# Patient Record
Sex: Male | Born: 1965 | Race: White | Hispanic: No | Marital: Married | Smoking: Former smoker
Health system: Southern US, Community
[De-identification: ages and names within clinical notes are randomized; demographics above are authoritative.]

## PROBLEM LIST (undated history)

## (undated) DIAGNOSIS — I10 Essential (primary) hypertension: Secondary | ICD-10-CM

## (undated) DIAGNOSIS — R5381 Other malaise: Secondary | ICD-10-CM

## (undated) DIAGNOSIS — E7800 Pure hypercholesterolemia, unspecified: Secondary | ICD-10-CM

## (undated) DIAGNOSIS — G4733 Obstructive sleep apnea (adult) (pediatric): Secondary | ICD-10-CM

## (undated) DIAGNOSIS — B351 Tinea unguium: Secondary | ICD-10-CM

## (undated) DIAGNOSIS — M1990 Unspecified osteoarthritis, unspecified site: Secondary | ICD-10-CM

## (undated) DIAGNOSIS — L719 Rosacea, unspecified: Secondary | ICD-10-CM

## (undated) DIAGNOSIS — G4730 Sleep apnea, unspecified: Secondary | ICD-10-CM

## (undated) DIAGNOSIS — I2510 Atherosclerotic heart disease of native coronary artery without angina pectoris: Secondary | ICD-10-CM

## (undated) DIAGNOSIS — R55 Syncope and collapse: Secondary | ICD-10-CM

## (undated) DIAGNOSIS — N529 Male erectile dysfunction, unspecified: Secondary | ICD-10-CM

## (undated) DIAGNOSIS — J019 Acute sinusitis, unspecified: Secondary | ICD-10-CM

## (undated) DIAGNOSIS — Z0000 Encounter for general adult medical examination without abnormal findings: Secondary | ICD-10-CM

## (undated) DIAGNOSIS — R5383 Other fatigue: Secondary | ICD-10-CM

## (undated) DIAGNOSIS — E291 Testicular hypofunction: Secondary | ICD-10-CM

## (undated) DIAGNOSIS — E785 Hyperlipidemia, unspecified: Secondary | ICD-10-CM

## (undated) DIAGNOSIS — K219 Gastro-esophageal reflux disease without esophagitis: Secondary | ICD-10-CM

## (undated) HISTORY — DX: Other fatigue: R53.83

## (undated) HISTORY — DX: Obstructive sleep apnea (adult) (pediatric): G47.33

## (undated) HISTORY — DX: Pure hypercholesterolemia, unspecified: E78.00

## (undated) HISTORY — DX: Gastro-esophageal reflux disease without esophagitis: K21.9

## (undated) HISTORY — DX: Male erectile dysfunction, unspecified: N52.9

## (undated) HISTORY — DX: Testicular hypofunction: E29.1

## (undated) HISTORY — DX: Tinea unguium: B35.1

## (undated) HISTORY — DX: Other malaise: R53.81

## (undated) HISTORY — DX: Acute sinusitis, unspecified: J01.90

## (undated) HISTORY — DX: Sleep apnea, unspecified: G47.30

## (undated) HISTORY — DX: Essential (primary) hypertension: I10

## (undated) HISTORY — PX: WRIST SURGERY: SHX841

## (undated) HISTORY — PX: CARDIAC CATHETERIZATION: SHX172

## (undated) HISTORY — DX: Hyperlipidemia, unspecified: E78.5

## (undated) HISTORY — DX: Encounter for general adult medical examination without abnormal findings: Z00.00

## (undated) HISTORY — DX: Morbid (severe) obesity due to excess calories: E66.01

## (undated) HISTORY — DX: Rosacea, unspecified: L71.9

---

## 1999-03-30 ENCOUNTER — Emergency Department (HOSPITAL_COMMUNITY): Admission: EM | Admit: 1999-03-30 | Discharge: 1999-03-30 | Payer: Self-pay | Admitting: Emergency Medicine

## 1999-07-21 HISTORY — PX: OTHER SURGICAL HISTORY: SHX169

## 2000-03-13 ENCOUNTER — Encounter: Payer: Self-pay | Admitting: Pulmonary Disease

## 2000-03-13 ENCOUNTER — Ambulatory Visit (HOSPITAL_BASED_OUTPATIENT_CLINIC_OR_DEPARTMENT_OTHER): Admission: RE | Admit: 2000-03-13 | Discharge: 2000-03-13 | Payer: Self-pay | Admitting: Pulmonary Disease

## 2001-07-20 HISTORY — PX: BACK SURGERY: SHX140

## 2001-12-22 ENCOUNTER — Observation Stay (HOSPITAL_COMMUNITY): Admission: RE | Admit: 2001-12-22 | Discharge: 2001-12-23 | Payer: Self-pay | Admitting: Neurosurgery

## 2002-04-13 ENCOUNTER — Ambulatory Visit (HOSPITAL_COMMUNITY): Admission: RE | Admit: 2002-04-13 | Discharge: 2002-04-13 | Payer: Self-pay | Admitting: Neurosurgery

## 2002-12-19 ENCOUNTER — Encounter: Admission: RE | Admit: 2002-12-19 | Discharge: 2002-12-19 | Payer: Self-pay | Admitting: Family Medicine

## 2002-12-19 ENCOUNTER — Encounter: Payer: Self-pay | Admitting: Family Medicine

## 2003-07-21 HISTORY — PX: KNEE SURGERY: SHX244

## 2003-10-26 ENCOUNTER — Emergency Department (HOSPITAL_COMMUNITY): Admission: EM | Admit: 2003-10-26 | Discharge: 2003-10-27 | Payer: Self-pay | Admitting: Emergency Medicine

## 2005-07-10 ENCOUNTER — Ambulatory Visit: Payer: Self-pay | Admitting: Family Medicine

## 2005-07-17 ENCOUNTER — Encounter: Admission: RE | Admit: 2005-07-17 | Discharge: 2005-07-17 | Payer: Self-pay | Admitting: Family Medicine

## 2005-07-20 HISTORY — PX: OTHER SURGICAL HISTORY: SHX169

## 2006-05-20 ENCOUNTER — Ambulatory Visit: Payer: Self-pay | Admitting: Family Medicine

## 2006-08-20 ENCOUNTER — Ambulatory Visit: Payer: Self-pay | Admitting: Family Medicine

## 2006-08-20 LAB — CONVERTED CEMR LAB
Cholesterol: 198 mg/dL (ref 0–200)
HDL: 32.7 mg/dL — ABNORMAL LOW (ref >39.0)
LDL Cholesterol: 149 mg/dL — ABNORMAL HIGH (ref 0–99)
Total CHOL/HDL Ratio: 6.1
Triglycerides: 81 mg/dL (ref 0–149)
VLDL: 16 mg/dL (ref 0–40)

## 2006-08-24 ENCOUNTER — Ambulatory Visit: Payer: Self-pay | Admitting: Family Medicine

## 2006-12-02 ENCOUNTER — Ambulatory Visit: Payer: Self-pay | Admitting: Family Medicine

## 2006-12-02 LAB — CONVERTED CEMR LAB
Cholesterol: 196 mg/dL (ref 0–200)
HDL: 33.9 mg/dL — ABNORMAL LOW (ref >39.0)
LDL Cholesterol: 141 mg/dL — ABNORMAL HIGH (ref 0–99)
Total CHOL/HDL Ratio: 5.8
Triglycerides: 105 mg/dL (ref 0–149)
VLDL: 21 mg/dL (ref 0–40)

## 2007-03-08 ENCOUNTER — Ambulatory Visit: Payer: Self-pay | Admitting: Family Medicine

## 2007-03-09 LAB — CONVERTED CEMR LAB
ALT: 20 units/L (ref 0–53)
AST: 16 units/L (ref 0–37)
LDL Cholesterol: 104 mg/dL — ABNORMAL HIGH (ref 0–99)
Total CHOL/HDL Ratio: 4.3

## 2007-10-03 ENCOUNTER — Ambulatory Visit: Payer: Self-pay | Admitting: Internal Medicine

## 2007-10-03 DIAGNOSIS — E782 Mixed hyperlipidemia: Secondary | ICD-10-CM | POA: Insufficient documentation

## 2007-10-03 DIAGNOSIS — E785 Hyperlipidemia, unspecified: Secondary | ICD-10-CM | POA: Insufficient documentation

## 2007-10-07 DIAGNOSIS — L719 Rosacea, unspecified: Secondary | ICD-10-CM | POA: Insufficient documentation

## 2007-11-14 ENCOUNTER — Ambulatory Visit: Payer: Self-pay | Admitting: Family Medicine

## 2007-11-14 DIAGNOSIS — I10 Essential (primary) hypertension: Secondary | ICD-10-CM | POA: Insufficient documentation

## 2007-11-25 ENCOUNTER — Ambulatory Visit: Payer: Self-pay | Admitting: Pulmonary Disease

## 2007-11-25 DIAGNOSIS — G4733 Obstructive sleep apnea (adult) (pediatric): Secondary | ICD-10-CM

## 2007-12-15 ENCOUNTER — Ambulatory Visit: Payer: Self-pay | Admitting: Family Medicine

## 2007-12-16 ENCOUNTER — Encounter: Payer: Self-pay | Admitting: Pulmonary Disease

## 2007-12-16 ENCOUNTER — Ambulatory Visit (HOSPITAL_BASED_OUTPATIENT_CLINIC_OR_DEPARTMENT_OTHER): Admission: RE | Admit: 2007-12-16 | Discharge: 2007-12-16 | Payer: Self-pay | Admitting: Pulmonary Disease

## 2007-12-30 ENCOUNTER — Encounter: Payer: Self-pay | Admitting: Internal Medicine

## 2007-12-30 ENCOUNTER — Ambulatory Visit: Payer: Self-pay | Admitting: Pulmonary Disease

## 2008-01-02 ENCOUNTER — Encounter: Payer: Self-pay | Admitting: Pulmonary Disease

## 2008-02-03 ENCOUNTER — Ambulatory Visit: Payer: Self-pay | Admitting: Pulmonary Disease

## 2008-03-14 ENCOUNTER — Ambulatory Visit: Payer: Self-pay | Admitting: Family Medicine

## 2008-03-21 ENCOUNTER — Encounter: Payer: Self-pay | Admitting: Family Medicine

## 2008-09-14 ENCOUNTER — Ambulatory Visit: Payer: Self-pay | Admitting: Family Medicine

## 2008-09-14 DIAGNOSIS — B351 Tinea unguium: Secondary | ICD-10-CM

## 2008-09-14 DIAGNOSIS — K219 Gastro-esophageal reflux disease without esophagitis: Secondary | ICD-10-CM

## 2008-09-14 LAB — CONVERTED CEMR LAB
Cholesterol, target level: 200 mg/dL
HDL goal, serum: 40 mg/dL

## 2008-09-19 ENCOUNTER — Ambulatory Visit: Payer: Self-pay | Admitting: Family Medicine

## 2008-09-26 LAB — CONVERTED CEMR LAB
Alkaline Phosphatase: 50 units/L (ref 39–117)
Bilirubin, Direct: 0.1 mg/dL (ref 0.0–0.3)
GFR calc Af Amer: 105 mL/min
GFR calc non Af Amer: 87 mL/min
Glucose, Bld: 112 mg/dL — ABNORMAL HIGH (ref 70–99)
LDL Cholesterol: 112 mg/dL — ABNORMAL HIGH (ref 0–99)
Potassium: 3.6 meq/L (ref 3.5–5.1)
Sodium: 140 meq/L (ref 135–145)
VLDL: 17 mg/dL (ref 0–40)

## 2009-01-03 ENCOUNTER — Ambulatory Visit: Payer: Self-pay | Admitting: Family Medicine

## 2009-06-20 ENCOUNTER — Telehealth: Payer: Self-pay | Admitting: Family Medicine

## 2009-08-21 ENCOUNTER — Ambulatory Visit: Payer: Self-pay | Admitting: Family Medicine

## 2009-08-21 ENCOUNTER — Telehealth: Payer: Self-pay | Admitting: Family Medicine

## 2009-08-26 ENCOUNTER — Telehealth: Payer: Self-pay | Admitting: Family Medicine

## 2009-09-03 ENCOUNTER — Ambulatory Visit: Payer: Self-pay | Admitting: Family Medicine

## 2009-09-03 DIAGNOSIS — N529 Male erectile dysfunction, unspecified: Secondary | ICD-10-CM

## 2009-09-11 ENCOUNTER — Ambulatory Visit: Payer: Self-pay | Admitting: Family Medicine

## 2009-09-11 LAB — CONVERTED CEMR LAB
Basophils Relative: 0.6 % (ref 0.0–3.0)
Bilirubin, Direct: 0.2 mg/dL (ref 0.0–0.3)
Calcium: 9.1 mg/dL (ref 8.4–10.5)
Creatinine, Ser: 1 mg/dL (ref 0.4–1.5)
Eosinophils Absolute: 0.2 10*3/uL (ref 0.0–0.7)
HDL: 37.1 mg/dL — ABNORMAL LOW (ref >39.00)
Hemoglobin: 14.7 g/dL (ref 13.0–17.0)
LDL Cholesterol: 85 mg/dL (ref 0–99)
Lymphocytes Relative: 37.3 % (ref 12.0–46.0)
MCHC: 34 g/dL (ref 30.0–36.0)
MCV: 99.2 fL (ref 78.0–100.0)
Monocytes Absolute: 0.4 10*3/uL (ref 0.1–1.0)
Neutro Abs: 2.3 10*3/uL (ref 1.4–7.7)
RBC: 4.35 M/uL (ref 4.22–5.81)
Total Bilirubin: 0.6 mg/dL (ref 0.3–1.2)
Total CHOL/HDL Ratio: 4
Triglycerides: 99 mg/dL (ref 0.0–149.0)

## 2009-09-17 ENCOUNTER — Ambulatory Visit: Payer: Self-pay | Admitting: Family Medicine

## 2009-09-17 DIAGNOSIS — E349 Endocrine disorder, unspecified: Secondary | ICD-10-CM

## 2010-02-21 ENCOUNTER — Ambulatory Visit: Payer: Self-pay | Admitting: Family Medicine

## 2010-02-21 DIAGNOSIS — R5383 Other fatigue: Secondary | ICD-10-CM

## 2010-02-21 DIAGNOSIS — R5381 Other malaise: Secondary | ICD-10-CM

## 2010-02-22 LAB — CONVERTED CEMR LAB
AST: 23 units/L (ref 0–37)
BUN: 15 mg/dL (ref 6–23)
Calcium: 9.2 mg/dL (ref 8.4–10.5)
Cholesterol: 145 mg/dL (ref 0–200)
Creatinine, Ser: 1.2 mg/dL (ref 0.4–1.5)
GFR calc non Af Amer: 71.99 mL/min (ref >60)
HDL: 30.2 mg/dL — ABNORMAL LOW (ref >39.00)
LDL Cholesterol: 92 mg/dL (ref 0–99)
Potassium: 3.9 meq/L (ref 3.5–5.1)
Testosterone: 211 ng/dL — ABNORMAL LOW (ref 350.00–890.00)
Total Bilirubin: 0.7 mg/dL (ref 0.3–1.2)
VLDL: 23 mg/dL (ref 0.0–40.0)

## 2010-02-28 ENCOUNTER — Ambulatory Visit: Payer: Self-pay | Admitting: Family Medicine

## 2010-08-17 LAB — CONVERTED CEMR LAB
AST: 19 units/L (ref 0–37)
Albumin: 3.7 g/dL (ref 3.5–5.2)
BUN: 10 mg/dL (ref 6–23)
Basophils Relative: 0.2 % (ref 0.0–1.0)
Calcium: 9.1 mg/dL (ref 8.4–10.5)
Chloride: 108 meq/L (ref 96–112)
Cholesterol: 151 mg/dL (ref 0–200)
Creatinine, Ser: 1 mg/dL (ref 0.4–1.5)
Eosinophils Absolute: 0.2 10*3/uL (ref 0.0–0.7)
Eosinophils Relative: 4.4 % (ref 0.0–5.0)
GFR calc non Af Amer: 88 mL/min
HCT: 45.2 % (ref 39.0–52.0)
Hemoglobin: 15.3 g/dL (ref 13.0–17.0)
MCV: 97.1 fL (ref 78.0–100.0)
Monocytes Absolute: 0.4 10*3/uL (ref 0.1–1.0)
Neutro Abs: 2.7 10*3/uL (ref 1.4–7.7)
RBC: 4.66 M/uL (ref 4.22–5.81)
TSH: 2.75 microintl units/mL (ref 0.35–5.50)
WBC: 5.3 10*3/uL (ref 4.5–10.5)

## 2010-08-19 NOTE — Letter (Signed)
Summary: Out of Work  [facility] [employer] [facility]  [address] [place], [place] [address]   Phone: [phone]  Fax: [phone]    August 21, 2009   Employee:  Todd Owens    To Whom It May Concern:   For Medical reasons, please excuse the above named employee from work for the following dates:  Start:  August 21, 2009 12:49 PM   End:  August 21, 2009 12:49 PM   If you need additional information, please feel free to contact our office.         Sincerely,    Kerby Nora, MD

## 2010-08-19 NOTE — Assessment & Plan Note (Signed)
Summary: 6 m f/u dlo   Vital Signs:  Patient profile:   45 year old male Height:      69 inches Weight:      325.0 pounds BMI:     48.17 Temp:     98.0 degrees F oral Pulse rate:   68 / minute Pulse rhythm:   regular BP sitting:   110 / 72  (left arm) Cuff size:   large  Vitals Entered By: Benny Lennert CMA Duncan Dull) (February 28, 2010 11:13 AM)  History of Present Illness: Chief complaint 6 month follow up  Testicular hypofunction...testosterone remains low..will refer to URO for further consideration of eval and treatment possibilities.  Has gained some weight. BP well controlled. Working on lifestyle change. Working on [employer]..but in busy period with life currently.   Lipid Management History:      Positive NCEP/ATP III risk factors include HDL cholesterol less than 40 and hypertension.  Negative NCEP/ATP III risk factors include male age less than 19 years old and non-tobacco-user status.        Adjunctive measures started by the patient include aerobic exercise, limit alcohol consumpton, and weight reduction.  He expresses no side effects from his lipid-lowering medication.  The patient denies any symptoms to suggest myopathy or liver disease.     Problems Prior to Update: 1)  Other Malaise and Fatigue  (ICD-780.79) 2)  Preventive Health Care  (ICD-V70.0) 3)  Testicular Hypofunction  (ICD-257.2) 4)  Erectile Dysfunction, Organic  (ICD-607.84) 5)  Gerd  (ICD-530.81) 6)  Onychomycosis, Bilateral  (ICD-110.1) 7)  Obstructive Sleep Apnea  (ICD-327.23) 8)  Essential Hypertension, Benign  (ICD-401.1) 9)  Acne, Rosacea  (ICD-695.3) 10)  Morbid Obesity  (ICD-278.01) 11)  Hyperlipidemia  (ICD-272.4)  Current Medications (verified): 1)  Simvastatin 40 Mg  Tabs (Simvastatin) .... Take One By Mouth Once A Day 2)  Triamterene-Hctz 37.5-25 Mg Tabs (Triamterene-Hctz) .Marland Kitchen.. 1 Tab By Mouth Daily 3)  Terbinafine Hcl 250 Mg Tabs (Terbinafine Hcl) .... Take 1 Tablet By Mouth Once A Day X  12 Weeks 4)  Ibuprofen 800 Mg Tabs (Ibuprofen) .... One Tab By Mouth in Amwith Food 5)  Omeprazole 40 Mg Cpdr (Omeprazole) .Marland Kitchen.. 1 Tab Po Daily 6)  Levitra 20 Mg Tabs (Vardenafil Hcl) .... 1/4-1/2 Tab As Needed Sexual Activity  Allergies (verified): No Known Drug Allergies  Past History:  Past medical, surgical, family and social histories (including risk factors) reviewed, and no changes noted (except as noted below).  Past Medical History: Reviewed history from 11/25/2007 and no changes required. Current Problems:  ESSENTIAL HYPERTENSION, BENIGN (ICD-401.1) ACNE, ROSACEA (ICD-695.3) MORBID OBESITY (ICD-278.01) SINUSITIS- ACUTE-NOS (ICD-461.9) HYPERLIPIDEMIA (ICD-272.4) HYPERCHOLESTEROLEMIA (ICD-272.0)    Past Surgical History: Reviewed history from 10/07/2007 and no changes required. 2003 back surgery 2005 knee surgery 04/2006 PFT's neg 2001 Cardiolyte neg  Family History: Reviewed history from 11/25/2007 and no changes required. mother HTN father: HTN  heart disease: father, mother, sister  Social History: Reviewed history from 11/25/2007 and no changes required. pt quit smoking. Regular exercise-yes, minimally since christmas pt is married.  Review of Systems General:  Complains of fatigue; denies fever. CV:  Denies chest pain or discomfort. Resp:  Denies shortness of breath. GI:  Denies bloody stools. GU:  Denies dysuria.  Physical Exam  General:  morbidly obese male in NAd Mouth:  Oral mucosa and oropharynx without lesions or exudates.  Teeth in good repair. smal orooharynx Neck:  no carotid bruit or thyromegaly no cervical or supraclavicular lymphadenopathy  Lungs:  Normal respiratory effort, chest expands symmetrically. Lungs are clear to auscultation, no crackles or wheezes. Heart:  Normal rate and regular rhythm. S1 and S2 normal without gallop, murmur, click, rub or other extra sounds. Abdomen:  Bowel sounds positive,abdomen soft and non-tender  without masses, organomegaly or hernias noted. Pulses:  R and L posterior tibial pulses are full and equal bilaterally  Extremities:  no edema Skin:  Intact without suspicious lesions or rashes Psych:  Oriented X3, memory intact for recent and remote, and normally interactive.     Impression & Recommendations:  Problem # 1:  TESTICULAR HYPOFUNCTION (ICD-257.2) Will eval further with prolactin, TSH, FSH and LH.  Not interested in referral to Uro at this time.   Problem # 2:  ERECTILE DYSFUNCTION, ORGANIC (ICD-607.84) Will send refill to [facility] where it may be cheaper.  His updated medication list for this problem includes:    Levitra 20 Mg Tabs (Vardenafil hcl) .Marland Kitchen... 1/4-1/2 tab as needed sexual activity  Problem # 3:  ESSENTIAL HYPERTENSION, BENIGN (ICD-401.1)  Well controlled. Continue current medication.  His updated medication list for this problem includes:    Triamterene-hctz 37.5-25 Mg Tabs (Triamterene-hctz) .Marland Kitchen... 1 tab by mouth daily  BP today: 110/72 Prior BP: 130/76 (09/17/2009)  Prior 10 Yr Risk Heart Disease: 4 % (09/17/2009)  Labs Reviewed: K+: 3.9 (02/21/2010) Creat: : 1.2 (02/21/2010)   Chol: 145 (02/21/2010)   HDL: 30.20 (02/21/2010)   LDL: 92 (02/21/2010)   TG: 115.0 (02/21/2010)  Problem # 4:  HYPERLIPIDEMIA (ICD-272.4) Encouraged exercise, weight loss, healthy eating habits.  His updated medication list for this problem includes:    Simvastatin 40 Mg Tabs (Simvastatin) .Marland Kitchen... Take one by mouth once a day  Labs Reviewed: SGOT: 23 (02/21/2010)   SGPT: 27 (02/21/2010)  Lipid Goals: Chol Goal: 200 (09/14/2008)   HDL Goal: 40 (09/14/2008)   LDL Goal: 130 (09/14/2008)   TG Goal: 150 (09/14/2008)  Prior 10 Yr Risk Heart Disease: 4 % (09/17/2009)   HDL:30.20 (02/21/2010), 37.10 (09/11/2009)  LDL:92 (02/21/2010), 85 (09/11/2009)  Chol:145 (02/21/2010), 142 (09/11/2009)  Trig:115.0 (02/21/2010), 99.0 (09/11/2009)  Complete Medication List: 1)  Simvastatin 40 Mg  Tabs (Simvastatin) .... Take one by mouth once a day 2)  Triamterene-hctz 37.5-25 Mg Tabs (Triamterene-hctz) .Marland Kitchen.. 1 tab by mouth daily 3)  Terbinafine Hcl 250 Mg Tabs (Terbinafine hcl) .... Take 1 tablet by mouth once a day x 12 weeks 4)  Ibuprofen 800 Mg Tabs (Ibuprofen) .... One tab by mouth in amwith food 5)  Omeprazole 40 Mg Cpdr (Omeprazole) .Marland Kitchen.. 1 tab po daily 6)  Levitra 20 Mg Tabs (Vardenafil hcl) .... 1/4-1/2 tab as needed sexual activity  Lipid Assessment/Plan:      Based on NCEP/ATP III, the patient's risk factor category is "2 or more risk factors and a calculated 10 year CAD risk of < 20%".  The patient's lipid goals are as follows: Total cholesterol goal is 200; LDL cholesterol goal is 130; HDL cholesterol goal is 40; Triglyceride goal is 150.  His LDL cholesterol goal has been met.    Patient Instructions: 1)  Schedule CPX in 6 months with fasting labs prior.Marland Kitchen 2)  CMET, lipids, testosterone, TSH, prolactin, FSH , LH 3)  Dx 272.0, 257.2 Prescriptions: LEVITRA 20 MG TABS (VARDENAFIL HCL) 1/4-1/2 tab as needed sexual activity  #10 x 0   Entered and Authorized by:   Kerby Nora MD   Signed by:   Kerby Nora MD on 02/28/2010   Method used:  Electronically to        [facility] (retail)       [address]       [place], [place]  [address]       Ph: [phone]       Fax: [phone]   RxID:   [id number]   Current Allergies (reviewed today): No known allergies

## 2010-08-19 NOTE — Progress Notes (Signed)
Summary: approval  Phone Note From Pharmacy   Caller: [facility] Call For: Dr Ermalene Searing  Details for Reason: FYI Summary of Call: appproval for Omeprazole for 08/02/2009 until 08/23/2010. Paper in box. Initial call taken by: Silas Sacramento CMA,  August 26, 2009 9:29 AM

## 2010-08-19 NOTE — Assessment & Plan Note (Signed)
Summary: flu???/rbh   Vital Signs:  Patient profile:   45 year old male Height:      69 inches Weight:      327.50 pounds BMI:     48.54 Temp:     98.6 degrees F oral Pulse rate:   76 / minute Pulse rhythm:   regular BP sitting:   128 / 80  (left arm) Cuff size:   large  Vitals Entered By: Lewanda Rife LPN (August 21, 2009 12:04 PM)  Acute Visit History:      The patient complains of earache, nasal discharge, and sore throat.  These symptoms began one day ago.  He denies chest pain, cough, fever, headache, and sinus problems.  Other comments include: sweats body ache, fatigue.        Earache symptom: no pain, ear fullness.  There is no history of recent antibiotic usage associated with the earache.        Problems Prior to Update: 1)  Temporomandibular Joint Pain  (ICD-524.62) 2)  Gerd  (ICD-530.81) 3)  Onychomycosis, Bilateral  (ICD-110.1) 4)  Obstructive Sleep Apnea  (ICD-327.23) 5)  Essential Hypertension, Benign  (ICD-401.1) 6)  Acne, Rosacea  (ICD-695.3) 7)  Morbid Obesity  (ICD-278.01) 8)  Sinusitis- Acute-nos  (ICD-461.9) 9)  Hyperlipidemia  (ICD-272.4) 10)  Hypercholesterolemia  (ICD-272.0)  Current Medications (verified): 1)  Simvastatin 40 Mg  Tabs (Simvastatin) .... Take One By Mouth Once A Day 2)  Triamterene-Hctz 37.5-25 Mg Tabs (Triamterene-Hctz) .Marland Kitchen.. 1 Tab By Mouth Daily 3)  Terbinafine Hcl 250 Mg Tabs (Terbinafine Hcl) .... Take 1 Tablet By Mouth Once A Day X 12 Weeks 4)  Ranitidine Hcl 150 Mg Tabs (Ranitidine Hcl) .Marland Kitchen.. 1 Tab By Mouth Two Times A Day 5)  Ibuprofen 800 Mg Tabs (Ibuprofen) .... One Tab By Mouth in Amwith Food  Allergies (verified): No Known Drug Allergies  Past History:  Past medical, surgical, family and social histories (including risk factors) reviewed, and no changes noted (except as noted below).  Past Medical History: Reviewed history from 11/25/2007 and no changes required. Current Problems:  ESSENTIAL HYPERTENSION, BENIGN  (ICD-401.1) ACNE, ROSACEA (ICD-695.3) MORBID OBESITY (ICD-278.01) SINUSITIS- ACUTE-NOS (ICD-461.9) HYPERLIPIDEMIA (ICD-272.4) HYPERCHOLESTEROLEMIA (ICD-272.0)    Past Surgical History: Reviewed history from 10/07/2007 and no changes required. 2003 back surgery 2005 knee surgery 04/2006 PFT's neg 2001 Cardiolyte neg  Family History: Reviewed history from 11/25/2007 and no changes required. mother HTN father: HTN  heart disease: father, mother, sister  Social History: Reviewed history from 11/25/2007 and no changes required. pt quit smoking. Regular exercise-yes, minimally since christmas pt is married.  Review of Systems General:  Complains of fatigue. CV:  Denies chest pain or discomfort. Resp:  Denies shortness of breath. GI:  Denies abdominal pain.  Physical Exam  General:  obese appearing male in NAD Head:  no maxillary sinus ttp.  Ears:  clear fluid B TMs Nose:  External nasal examination shows no deformity or inflammation. Nasal mucosa are pink and moist without lesions or exudates. Mouth:  MMM, no oropharyngel erythema, no exudate Neck:  no carotid bruit or thyromegaly no cervical or supraclavicular lymphadenopathy  Lungs:  Normal respiratory effort, chest expands symmetrically. Lungs are clear to auscultation, no crackles or wheezes. Heart:  Normal rate and regular rhythm. S1 and S2 normal without gallop, murmur, click, rub or other extra sounds.   Impression & Recommendations:  Problem # 1:  URI (ICD-465.9) Not clearlyt flu, but may e early on dicsussed viral illness time  course. Discussed symtpomatic care. Call i fSOB.  if fever or worsening symtpoms..can call to consider tamiflu, but he is not high risk for complications from flu His updated medication list for this problem includes:    Ibuprofen 800 Mg Tabs (Ibuprofen) ..... One tab by mouth in amwith food  Complete Medication List: 1)  Simvastatin 40 Mg Tabs (Simvastatin) .... Take one by mouth once  a day 2)  Triamterene-hctz 37.5-25 Mg Tabs (Triamterene-hctz) .Marland Kitchen.. 1 tab by mouth daily 3)  Terbinafine Hcl 250 Mg Tabs (Terbinafine hcl) .... Take 1 tablet by mouth once a day x 12 weeks 4)  Ranitidine Hcl 150 Mg Tabs (Ranitidine hcl) .Marland Kitchen.. 1 tab by mouth two times a day 5)  Ibuprofen 800 Mg Tabs (Ibuprofen) .... One tab by mouth in amwith food  Patient Instructions: 1)  Mucinex Dm during day. Call if cough starts and need nighttime prescription cough suppressant. 2)  Expect cough to begin, increase in congestion in next few days..call if SOB or no improving as disussed at appt. 3)  Stay out of work today and evaluate how feeling in AM..do not return if fever..wait 24 hours.   Current Allergies (reviewed today): No known allergies

## 2010-08-19 NOTE — Progress Notes (Signed)
Summary: Stomach medication  Phone Note Call from Patient Call back at Home Phone [phone]   Caller: Patient Call For: Kerby Nora MD Summary of Call: Patient states that the stomach medication that he was put on is not working.  He was told that he had to try this before his insurance would agree to pay for anything else.  What does he need to do now? Initial call taken by: Delilah Shan CMA (AAMA),  August 21, 2009 12:54 PM  Follow-up for Phone Call        I will send rx in again and will complete prior auth when arrives..unless you know a better way to go about it.  Follow-up by: Kerby Nora MD,  August 21, 2009   Additional Follow-up for Phone Call Additional follow up Details #1::        Called [facility] at [phone] to request forms for prior authorization, spoke with Sue Lush who will be sending forms to our office today.  Linde Gillis CMA Duncan Dull)  August 21, 2009 3:46 PM   Received prior authorization form, form in your IN box. Additional Follow-up by: Linde Gillis CMA (AAMA),  August 22, 2009 9:02 AM    New/Updated Medications: OMEPRAZOLE 40 MG CPDR (OMEPRAZOLE) 1 tab po daily Prescriptions: OMEPRAZOLE 40 MG CPDR (OMEPRAZOLE) 1 tab po daily  #30 x 3   Entered and Authorized by:   Kerby Nora MD   Signed by:   Kerby Nora MD on 08/21/2009   Method used:   Electronically to        [facility] (retail)       [address]       [place], [place]  [address]       Ph: [phone] or [phone]       Fax: [phone]   RxID:   [id number]   Appended Document: Stomach medication Authorization form faxed to [facility], [phone].

## 2010-08-19 NOTE — Assessment & Plan Note (Signed)
Summary: personal issues/ alc   Vital Signs:  Patient profile:   45 year old male Height:      69 inches Weight:      323.2 pounds BMI:     47.90 Temp:     98.2 degrees F oral Pulse rate:   76 / minute Pulse rhythm:   regular BP sitting:   120 / 84  (left arm) Cuff size:   large  Vitals Entered By: Benny Lennert CMA Duncan Dull) (September 03, 2009 4:59 PM)  History of Present Illness: Chief complaint personal issue  Cannot feel urine moving through "plumbing" , ongoing in last few months. ED as well...difficulty obtaining as well as maintaining erection. Desire present.  No fatigue. No retrograde ejaculation.  no testicular changes.  No dysuria.  normal flow. No frequent urination. No urgeny   Problems Prior to Update: 1)  Erectile Dysfunction, Organic  (YYT-035.46) 2)  Temporomandibular Joint Pain  (ICD-524.62) 3)  Gerd  (ICD-530.81) 4)  Onychomycosis, Bilateral  (ICD-110.1) 5)  Obstructive Sleep Apnea  (ICD-327.23) 6)  Essential Hypertension, Benign  (ICD-401.1) 7)  Acne, Rosacea  (ICD-695.3) 8)  Morbid Obesity  (ICD-278.01) 9)  Sinusitis- Acute-nos  (ICD-461.9) 10)  Hyperlipidemia  (ICD-272.4) 11)  Hypercholesterolemia  (ICD-272.0)  Current Medications (verified): 1)  Simvastatin 40 Mg  Tabs (Simvastatin) .... Take One By Mouth Once A Day 2)  Triamterene-Hctz 37.5-25 Mg Tabs (Triamterene-Hctz) .Marland Kitchen.. 1 Tab By Mouth Daily 3)  Terbinafine Hcl 250 Mg Tabs (Terbinafine Hcl) .... Take 1 Tablet By Mouth Once A Day X 12 Weeks 4)  Ibuprofen 800 Mg Tabs (Ibuprofen) .... One Tab By Mouth in Amwith Food 5)  Omeprazole 40 Mg Cpdr (Omeprazole) .Marland Kitchen.. 1 Tab Po Daily  Allergies (verified): No Known Drug Allergies  Past History:  Past medical, surgical, family and social histories (including risk factors) reviewed, and no changes noted (except as noted below).  Past Medical History: Reviewed history from 11/25/2007 and no changes required. Current Problems:  ESSENTIAL  HYPERTENSION, BENIGN (ICD-401.1) ACNE, ROSACEA (ICD-695.3) MORBID OBESITY (ICD-278.01) SINUSITIS- ACUTE-NOS (ICD-461.9) HYPERLIPIDEMIA (ICD-272.4) HYPERCHOLESTEROLEMIA (ICD-272.0)    Past Surgical History: Reviewed history from 10/07/2007 and no changes required. 2003 back surgery 2005 knee surgery 04/2006 PFT's neg 2001 Cardiolyte neg  Family History: Reviewed history from 11/25/2007 and no changes required. mother HTN father: HTN  heart disease: father, mother, sister  Social History: Reviewed history from 11/25/2007 and no changes required. pt quit smoking. Regular exercise-yes, minimally since christmas pt is married.  Review of Systems General:  Denies fatigue and fever. CV:  Denies chest pain or discomfort. Resp:  Denies shortness of breath. GI:  Denies abdominal pain and indigestion. GU:  Complains of genital sores.  Physical Exam  General:  obese appearing male in NAD Mouth:  MMM, no oropharyngel erythema, no exudate Lungs:  Normal respiratory effort, chest expands symmetrically. Lungs are clear to auscultation, no crackles or wheezes. Heart:  Normal rate and regular rhythm. S1 and S2 normal without gallop, murmur, click, rub or other extra sounds. Abdomen:  Bowel sounds positive,abdomen soft and non-tender without masses, organomegaly or hernias noted. Genitalia:  due for CPX.Marland Kitchenwill return for exam Pulses:  R and L posterior tibial pulses are full and equal bilaterally  Extremities:  no edema  Psych:  Cognition and judgment appear intact. Alert and cooperative with normal attention span and concentration. No apparent delusions, illusions, hallucinations   Impression & Recommendations:  Problem # 1:  ERECTILE DYSFUNCTION, ORGANIC (ICD-607.84)  Likely multifactorial..due to  obesity, medication, HTN, metabolic syndrome..will eval for diabetes, low testosterone.  Encouraged him to loose weight exercsie, eat healthy.  Will try trial of Levitra 20 mg as  needed...  Problem # 2:  ESSENTIAL HYPERTENSION, BENIGN (ICD-401.1) Well controlled on current medicaiton.  His updated medication list for this problem includes:    Triamterene-hctz 37.5-25 Mg Tabs (Triamterene-hctz) .Marland Kitchen... 1 tab by mouth daily  Problem # 3:  HYPERLIPIDEMIA (ICD-272.4) Due for reeval.  His updated medication list for this problem includes:    Simvastatin 40 Mg Tabs (Simvastatin) .Marland Kitchen... Take one by mouth once a day  Complete Medication List: 1)  Simvastatin 40 Mg Tabs (Simvastatin) .... Take one by mouth once a day 2)  Triamterene-hctz 37.5-25 Mg Tabs (Triamterene-hctz) .Marland Kitchen.. 1 tab by mouth daily 3)  Terbinafine Hcl 250 Mg Tabs (Terbinafine hcl) .... Take 1 tablet by mouth once a day x 12 weeks 4)  Ibuprofen 800 Mg Tabs (Ibuprofen) .... One tab by mouth in amwith food 5)  Omeprazole 40 Mg Cpdr (Omeprazole) .Marland Kitchen.. 1 tab po daily  Patient Instructions: 1)  Fasting lipid, CMET Dx 401.1, testosterone, cbc, TSH Dx 478.29 Prescriptions: AVELOX 400 MG TABS (MOXIFLOXACIN HCL) 1 tab by mouth daily x 10 days  #10 x 0   Entered and Authorized by:   Kerby Nora MD   Signed by:   Kerby Nora MD on 09/03/2009   Method used:   Electronically to        [facility]* (retail)       [address]       [place], [place]  [address]       Ph: [phone]       Fax: [phone]   RxID:   [id number]   Current Allergies (reviewed today): No known allergies    Orders Added: 1)  Est. Patient Level IV [74259]

## 2010-08-19 NOTE — Assessment & Plan Note (Signed)
Summary: cpx/dlo   Vital Signs:  Patient profile:   45 year old male Height:      69 inches Weight:      317.6 pounds BMI:     47.07 Temp:     98.3 degrees F Pulse rate:   68 / minute Pulse rhythm:   regular BP sitting:   130 / 76  (left arm) Cuff size:   large  Vitals Entered By: Benny Lennert CMA Duncan Dull) (September 17, 2009 2:45 PM)  History of Present Illness: Chief complaint cpx   Has lost 10 lbs in last month...minimal exercisie.    Lipid Management History:      Positive NCEP/ATP III risk factors include HDL cholesterol less than 40 and hypertension.  Negative NCEP/ATP III risk factors include male age less than 67 years old and non-tobacco-user status.        The patient expresses understanding of adjunctive measures for cholesterol lowering.  Adjunctive measures started by the patient include aerobic exercise, omega-3 supplements, limit alcohol consumpton, and weight reduction.  He expresses no side effects from his lipid-lowering medication.  The patient denies any symptoms to suggest myopathy or liver disease.     Problems Prior to Update: 1)  Erectile Dysfunction, Organic  (ICD-607.84) 2)  Gerd  (ICD-530.81) 3)  Onychomycosis, Bilateral  (ICD-110.1) 4)  Obstructive Sleep Apnea  (ICD-327.23) 5)  Essential Hypertension, Benign  (ICD-401.1) 6)  Acne, Rosacea  (ICD-695.3) 7)  Morbid Obesity  (ICD-278.01) 8)  Hyperlipidemia  (ICD-272.4)  Current Medications (verified): 1)  Simvastatin 40 Mg  Tabs (Simvastatin) .... Take One By Mouth Once A Day 2)  Triamterene-Hctz 37.5-25 Mg Tabs (Triamterene-Hctz) .Marland Kitchen.. 1 Tab By Mouth Daily 3)  Terbinafine Hcl 250 Mg Tabs (Terbinafine Hcl) .... Take 1 Tablet By Mouth Once A Day X 12 Weeks 4)  Ibuprofen 800 Mg Tabs (Ibuprofen) .... One Tab By Mouth in Amwith Food 5)  Omeprazole 40 Mg Cpdr (Omeprazole) .Marland Kitchen.. 1 Tab Po Daily  Allergies (verified): No Known Drug Allergies  Past History:  Past medical, surgical, family and social  histories (including risk factors) reviewed, and no changes noted (except as noted below).  Past Medical History: Reviewed history from 11/25/2007 and no changes required. Current Problems:  ESSENTIAL HYPERTENSION, BENIGN (ICD-401.1) ACNE, ROSACEA (ICD-695.3) MORBID OBESITY (ICD-278.01) SINUSITIS- ACUTE-NOS (ICD-461.9) HYPERLIPIDEMIA (ICD-272.4) HYPERCHOLESTEROLEMIA (ICD-272.0)    Past Surgical History: Reviewed history from 10/07/2007 and no changes required. 2003 back surgery 2005 knee surgery 04/2006 PFT's neg 2001 Cardiolyte neg  Family History: Reviewed history from 11/25/2007 and no changes required. mother HTN father: HTN  heart disease: father, mother, sister  Social History: Reviewed history from 11/25/2007 and no changes required. pt quit smoking. Regular exercise-yes, minimally since christmas pt is married.  Review of Systems General:  Denies fatigue and fever. CV:  Denies chest pain or discomfort. Resp:  Denies shortness of breath. GI:  Denies abdominal pain. GU:  Denies dysuria; ED improved when uses Cialis. .  Physical Exam  General:  morbidly obese mal ein NAd Eyes:  No corneal or conjunctival inflammation noted. EOMI. Perrla. Funduscopic exam benign, without hemorrhages, exudates or papilledema. Vision grossly normal. Ears:  External ear exam shows no significant lesions or deformities.  Otoscopic examination reveals clear canals, tympanic membranes are intact bilaterally without bulging, retraction, inflammation or discharge. Hearing is grossly normal bilaterally. Nose:  External nasal examination shows no deformity or inflammation. Nasal mucosa are pink and moist without lesions or exudates. Mouth:  Oral mucosa and  oropharynx without lesions or exudates.  Teeth in good repair. smal orooharynx Neck:  no carotid bruit or thyromegaly no cervical or supraclavicular lymphadenopathy  Lungs:  Normal respiratory effort, chest expands symmetrically. Lungs are  clear to auscultation, no crackles or wheezes. Heart:  Normal rate and regular rhythm. S1 and S2 normal without gallop, murmur, click, rub or other extra sounds. Abdomen:  Bowel sounds positive,abdomen soft and non-tender without masses, organomegaly or hernias noted. Genitalia:  Testes bilaterally descended without nodularity, tenderness or masses. No scrotal masses or lesions. No penis lesions or urethral discharge. Msk:  No deformity or scoliosis noted of thoracic or lumbar spine.   Pulses:  R and L posterior tibial pulses are full and equal bilaterally  Extremities:  no edema Neurologic:  No cranial nerve deficits noted. Station and gait are normal. DTRs are symmetrical throughout. Sensory, motor and coordinative functions appear intact. Skin:  Intact without suspicious lesions or rashes Multiple skin tags, one on right eyelid.  Psych:  Cognition and judgment appear intact. Alert and cooperative with normal attention span and concentration. No apparent delusions, illusions, hallucinations   Impression & Recommendations:  Problem # 1:  Preventive Health Care (ICD-V70.0) The patient's preventative maintenance and recommended screening tests for an annual wellness exam were reviewed in full today. Brought up to date unless services declined.  Counselled on the importance of diet, exercise, and its role in overall health and mortality. The patient's FH and SH was reviewed, including their home life, tobacco status, and drug and alcohol status.     Problem # 2:  GERD (ICD-530.81) Get back on omeprazole given ranitidine not helping.  His updated medication list for this problem includes:    Omeprazole 40 Mg Cpdr (Omeprazole) .Marland Kitchen... 1 tab po daily  Problem # 3:  ERECTILE DYSFUNCTION, ORGANIC (ICD-607.84) Levitra works well for him. His updated medication list for this problem includes:    Levitra 20 Mg Tabs (Vardenafil hcl) .Marland Kitchen... 1/4-1/2 tab as needed sexual activity  Problem # 4:   TESTICULAR HYPOFUNCTION (ICD-257.2) Mildly decreased..will recheck in 6 months..if still low and if fatigue..will recommend uro referral. No evidence of decrease in size of testicles.   Complete Medication List: 1)  Simvastatin 40 Mg Tabs (Simvastatin) .... Take one by mouth once a day 2)  Triamterene-hctz 37.5-25 Mg Tabs (Triamterene-hctz) .Marland Kitchen.. 1 tab by mouth daily 3)  Terbinafine Hcl 250 Mg Tabs (Terbinafine hcl) .... Take 1 tablet by mouth once a day x 12 weeks 4)  Ibuprofen 800 Mg Tabs (Ibuprofen) .... One tab by mouth in amwith food 5)  Omeprazole 40 Mg Cpdr (Omeprazole) .Marland Kitchen.. 1 tab po daily 6)  Levitra 20 Mg Tabs (Vardenafil hcl) .... 1/4-1/2 tab as needed sexual activity  Lipid Assessment/Plan:      Based on NCEP/ATP III, the patient's risk factor category is "2 or more risk factors and a calculated 10 year CAD risk of < 20%".  The patient's lipid goals are as follows: Total cholesterol goal is 200; LDL cholesterol goal is 130; HDL cholesterol goal is 40; Triglyceride goal is 150.  His LDL cholesterol goal has been met.    Patient Instructions: 1)  In 6 months CMET, lipids Dx 272.0 testoterone Dx 780.79 2)  Please schedule a follow-up appointment in 6 months .  Prescriptions: LEVITRA 20 MG TABS (VARDENAFIL HCL) 1/4-1/2 tab as needed sexual activity  #10 x 0   Entered and Authorized by:   Kerby Nora MD   Signed by:   Linton Rump  Ashby Moskal MD on 09/17/2009   Method used:   Electronically to        [facility]  [address] [id number] (retail[facility]       [address]       [place], [place]  [address]       Ph: [phone] or [phone]       Fax: [phone]   RxID:   [id number] OMEPRAZOLE 40 MG CPDR (OMEPRAZOLE) 1 tab po daily  #30 x 3   Entered and Authorized by:   Kerby Nora MD   Signed by:   Kerby Nora MD on 09/17/2009   Method used:   Print then Give to Patient   RxID:   [id number]   Current Allergies (reviewed today): No known allergies       Tetanus/Td Vaccine     Vaccine Type: Tdap

## 2010-08-29 ENCOUNTER — Telehealth: Payer: Self-pay | Admitting: Family Medicine

## 2010-09-10 NOTE — Progress Notes (Signed)
Summary: prior auth needed for omeprazole  Phone Note From Pharmacy   [facility]  [address] [id number] Summary of Call: Prior Berkley Harvey is needed for omeprazole, form is on your desk. Initial call taken by: Lowella Petties CMA, AAMA,  August 29, 2010 4:28 PM  Follow-up for Phone Call        Please call pt to find out if he has tried H2 blockerfor at least 30 days.. like zantac, pepcid etc. If not insurance need to to have him try this before they will pay for omeprazole.  If he would prefer he can pay for omeprazole  as prilosec OTC out of pocket Follow-up by: Kerby Nora MD,  August 29, 2010 5:14 PM  Additional Follow-up for Phone Call Additional follow up Details #1::        Pt tried zantac about a year ago, but said that didnt work, prior [place] was approved for omeprazole at that time.  Form is still on your desk. Additional Follow-up by: Lowella Petties CMA, AAMA,  September 01, 2010 8:44 AM    Additional Follow-up for Phone Call Additional follow up Details #2::    Completed.  Follow-up by: Kerby Nora MD,  September 01, 2010 8:47 AM   Appended Document: prior auth needed for omeprazole Prior auth given for omeprazole, advised pharmacy.  Approval letter placed on doctor's desk for signature and scanning.

## 2010-09-19 ENCOUNTER — Encounter: Payer: Self-pay | Admitting: Family Medicine

## 2010-09-19 ENCOUNTER — Encounter (INDEPENDENT_AMBULATORY_CARE_PROVIDER_SITE_OTHER): Payer: Self-pay | Admitting: *Deleted

## 2010-09-19 ENCOUNTER — Other Ambulatory Visit: Payer: Self-pay | Admitting: Family Medicine

## 2010-09-19 ENCOUNTER — Other Ambulatory Visit (INDEPENDENT_AMBULATORY_CARE_PROVIDER_SITE_OTHER): Payer: BC Managed Care – PPO

## 2010-09-19 DIAGNOSIS — E291 Testicular hypofunction: Secondary | ICD-10-CM

## 2010-09-19 DIAGNOSIS — E785 Hyperlipidemia, unspecified: Secondary | ICD-10-CM

## 2010-09-19 LAB — BASIC METABOLIC PANEL
BUN: 16 mg/dL (ref 6–23)
Calcium: 9.1 mg/dL (ref 8.4–10.5)
Creatinine, Ser: 1.1 mg/dL (ref 0.4–1.5)
GFR: 80.47 mL/min (ref >60.00)
Glucose, Bld: 103 mg/dL — ABNORMAL HIGH (ref 70–99)

## 2010-09-19 LAB — LIPID PANEL
Cholesterol: 140 mg/dL (ref 0–200)
HDL: 32.1 mg/dL — ABNORMAL LOW (ref >39.00)
VLDL: 12.2 mg/dL (ref 0.0–40.0)

## 2010-09-19 LAB — TSH: TSH: 3.27 u[IU]/mL (ref 0.35–5.50)

## 2010-09-19 LAB — HEPATIC FUNCTION PANEL: Albumin: 3.8 g/dL (ref 3.5–5.2)

## 2010-09-22 LAB — CONVERTED CEMR LAB: Prolactin: 20.5 ng/mL — ABNORMAL HIGH (ref 2.1–17.1)

## 2010-09-26 ENCOUNTER — Encounter (INDEPENDENT_AMBULATORY_CARE_PROVIDER_SITE_OTHER): Payer: BC Managed Care – PPO | Admitting: Family Medicine

## 2010-09-26 ENCOUNTER — Encounter: Payer: Self-pay | Admitting: Family Medicine

## 2010-09-26 ENCOUNTER — Other Ambulatory Visit: Payer: Self-pay | Admitting: Family Medicine

## 2010-09-26 DIAGNOSIS — E221 Hyperprolactinemia: Secondary | ICD-10-CM | POA: Insufficient documentation

## 2010-09-26 DIAGNOSIS — Z0000 Encounter for general adult medical examination without abnormal findings: Secondary | ICD-10-CM

## 2010-09-26 DIAGNOSIS — E229 Hyperfunction of pituitary gland, unspecified: Secondary | ICD-10-CM

## 2010-09-29 LAB — CONVERTED CEMR LAB: Prolactin: 6.9 ng/mL (ref 2.1–17.1)

## 2010-10-07 NOTE — Assessment & Plan Note (Signed)
Summary: CPX/RBH   Vital Signs:  Patient profile:   45 year old male Height:      69 inches Weight:      325 pounds BMI:     48.17 Temp:     98.5 degrees F oral Pulse rate:   72 / minute Pulse rhythm:   regular BP sitting:   140 / 90  (left arm) Cuff size:   large  Vitals Entered By: Benny Lennert CMA Duncan Dull) (September 26, 2010 11:17 AM)  History of Present Illness: Chief complaint cpx  The patient is here for annual wellness exam and preventative care.     HTN...not checking BP at home...has not taken med yet today. On triamterene/HCTZ.  High cholesterol: well controlled on  simvastatin daily   Testicular hypofunction...continued fatigue.     Problems Prior to Update: 1)  Other Malaise and Fatigue  (ICD-780.79) 2)  Preventive Health Care  (ICD-V70.0) 3)  Testicular Hypofunction  (ICD-257.2) 4)  Erectile Dysfunction, Organic  (ICD-607.84) 5)  Gerd  (ICD-530.81) 6)  Onychomycosis, Bilateral  (ICD-110.1) 7)  Obstructive Sleep Apnea  (ICD-327.23) 8)  Essential Hypertension, Benign  (ICD-401.1) 9)  Acne, Rosacea  (ICD-695.3) 10)  Morbid Obesity  (ICD-278.01) 11)  Hyperlipidemia  (ICD-272.4)  Current Medications (verified): 1)  Simvastatin 40 Mg  Tabs (Simvastatin) .... Take One By Mouth Once A Day 2)  Triamterene-Hctz 37.5-25 Mg Tabs (Triamterene-Hctz) .Marland Kitchen.. 1 Tab By Mouth Daily 3)  Ibuprofen 800 Mg Tabs (Ibuprofen) .... One Tab By Mouth in Amwith Food 4)  Omeprazole 40 Mg Cpdr (Omeprazole) .Marland Kitchen.. 1 Tab Po Daily  Allergies (verified): No Known Drug Allergies  Past History:  Past medical, surgical, family and social histories (including risk factors) reviewed, and no changes noted (except as noted below).  Past Medical History: Reviewed history from 11/25/2007 and no changes required. Current Problems:  ESSENTIAL HYPERTENSION, BENIGN (ICD-401.1) ACNE, ROSACEA (ICD-695.3) MORBID OBESITY (ICD-278.01) SINUSITIS- ACUTE-NOS (ICD-461.9) HYPERLIPIDEMIA  (ICD-272.4) HYPERCHOLESTEROLEMIA (ICD-272.0)    Past Surgical History: Reviewed history from 10/07/2007 and no changes required. 2003 back surgery 2005 knee surgery 04/2006 PFT's neg 2001 Cardiolyte neg  Family History: Reviewed history from 11/25/2007 and no changes required. mother HTN father: HTN  heart disease: father, mother, sister  Social History: Reviewed history from 11/25/2007 and no changes required. pt quit smoking. Regular exercise-yes, minimally since christmas pt is married.  Review of Systems General:  Complains of fatigue; denies chills and fever. CV:  Denies chest pain or discomfort. Resp:  Denies shortness of breath. GI:  Denies abdominal pain and bloody stools. GU:  Denies dysuria.  Physical Exam  General:  morbidly obese male in NAd Eyes:  No corneal or conjunctival inflammation noted. EOMI. Perrla. Funduscopic exam benign, without hemorrhages, exudates or papilledema. Vision grossly normal. Ears:  External ear exam shows no significant lesions or deformities.  Otoscopic examination reveals clear canals, tympanic membranes are intact bilaterally without bulging, retraction, inflammation or discharge. Hearing is grossly normal bilaterally. Nose:  External nasal examination shows no deformity or inflammation. Nasal mucosa are pink and moist without lesions or exudates. Mouth:  Oral mucosa and oropharynx without lesions or exudates.  Teeth in good repair. smal orooharynx Neck:  no carotid bruit or thyromegaly no cervical or supraclavicular lymphadenopathy  Breasts:  gynecomastia Lungs:  Normal respiratory effort, chest expands symmetrically. Lungs are clear to auscultation, no crackles or wheezes. Heart:  Normal rate and regular rhythm. S1 and S2 normal without gallop, murmur, click, rub or other extra sounds.  Abdomen:  Bowel sounds positive,abdomen soft and non-tender without masses, organomegaly or hernias noted. Genitalia:  Testes bilaterally descended  without nodularity, tenderness or masses. No scrotal masses or lesions. No penis lesions or urethral discharge. Msk:  No deformity or scoliosis noted of thoracic or lumbar spine.   Pulses:  R and L posterior tibial pulses are full and equal bilaterally  Extremities:  no edema Skin:  Intact without suspicious lesions or rashes Psych:  Oriented X3, memory intact for recent and remote, and normally interactive.     Impression & Recommendations:  Problem # 1:  PREVENTIVE HEALTH CARE (ICD-V70.0) The patient's preventative maintenance and recommended screening tests for an annual wellness exam were reviewed in full today. Brought up to date unless services declined.  Counselled on the importance of diet, exercise, and its role in overall health and mortality. The patient's FH and SH was reviewed, including their home life, tobacco status, and drug and alcohol status.     Problem # 2:  HYPERPROLACTINEMIA (ICD-253.1) In work up of low testosterone.. high prolatin noted... discussed reeval.. if verified high.. proceed with MRI brain.  Orders: T-Prolactin ([id number]) Specimen Handling ([id number])  Problem # 3:  TESTICULAR HYPOFUNCTION (ICD-257.2) Pt to consider referral to URO for possible testosterone replacement.   Problem # 4:  ESSENTIAL HYPERTENSION, BENIGN (ICD-401.1)  Well controlled when taking medicaiton. Encouraged exercise, weight loss, healthy eating habits.  His updated medication list for this problem includes:    Triamterene-hctz 37.5-25 Mg Tabs (Triamterene-hctz) .Marland Kitchen... 1 tab by mouth daily  BP today: 140/90 Prior BP: 110/72 (02/28/2010)  Prior 10 Yr Risk Heart Disease: 4 % (09/17/2009)  Labs Reviewed: K+: 4.0 (09/19/2010) Creat: : 1.1 (09/19/2010)   Chol: 140 (09/19/2010)   HDL: 32.10 (09/19/2010)   LDL: 96 (09/19/2010)   TG: 61.0 (09/19/2010)  Problem # 5:  HYPERLIPIDEMIA (ICD-272.4) Well controlled. Continue current medication.  His updated medication list for this  problem includes:    Simvastatin 40 Mg Tabs (Simvastatin) .Marland Kitchen... Take one by mouth once a day  Labs Reviewed: SGOT: 22 (09/19/2010)   SGPT: 23 (09/19/2010)  Lipid Goals: Chol Goal: 200 (09/14/2008)   HDL Goal: 40 (09/14/2008)   LDL Goal: 130 (09/14/2008)   TG Goal: 150 (09/14/2008)  Prior 10 Yr Risk Heart Disease: 4 % (09/17/2009)   HDL:32.10 (09/19/2010), 30.20 (02/21/2010)  LDL:96 (09/19/2010), 92 (02/21/2010)  Chol:140 (09/19/2010), 145 (02/21/2010)  Trig:61.0 (09/19/2010), 115.0 (02/21/2010)  Complete Medication List: 1)  Simvastatin 40 Mg Tabs (Simvastatin) .... Take one by mouth once a day 2)  Triamterene-hctz 37.5-25 Mg Tabs (Triamterene-hctz) .Marland Kitchen.. 1 tab by mouth daily 3)  Ibuprofen 800 Mg Tabs (Ibuprofen) .... One tab by mouth in amwith food 4)  Omeprazole 40 Mg Cpdr (Omeprazole) .Marland Kitchen.. 1 tab po daily  Patient Instructions: 1)  Please call us to set up MRI or ENDO referral  once you review prolactinoma info.    Orders Added: 1)  T-Prolactin [[id number]] 2)  Specimen Handling [[id number]] 3)  Est. Patient 40-64 years [[id number]]    Current Allergies (reviewed today): No known allergies   TD Result Date:  01/17/2010 TD Result:  given

## 2010-10-10 ENCOUNTER — Other Ambulatory Visit (INDEPENDENT_AMBULATORY_CARE_PROVIDER_SITE_OTHER): Payer: BC Managed Care – PPO | Admitting: Family Medicine

## 2010-10-10 DIAGNOSIS — E349 Endocrine disorder, unspecified: Secondary | ICD-10-CM

## 2010-10-10 DIAGNOSIS — R5383 Other fatigue: Secondary | ICD-10-CM

## 2010-10-10 DIAGNOSIS — R5381 Other malaise: Secondary | ICD-10-CM

## 2010-10-14 NOTE — Progress Notes (Signed)
Addended byKerby Nora on: 10/14/2010 09:12 AM   Modules accepted: Orders

## 2010-10-15 ENCOUNTER — Encounter: Payer: Self-pay | Admitting: Family Medicine

## 2010-10-16 ENCOUNTER — Ambulatory Visit (INDEPENDENT_AMBULATORY_CARE_PROVIDER_SITE_OTHER): Payer: BC Managed Care – PPO | Admitting: Family Medicine

## 2010-10-16 ENCOUNTER — Encounter: Payer: Self-pay | Admitting: Family Medicine

## 2010-10-16 VITALS — BP 138/92 | HR 80 | Temp 98.5°F | Wt 323.1 lb

## 2010-10-16 DIAGNOSIS — N529 Male erectile dysfunction, unspecified: Secondary | ICD-10-CM

## 2010-10-16 DIAGNOSIS — R05 Cough: Secondary | ICD-10-CM

## 2010-10-16 DIAGNOSIS — R059 Cough, unspecified: Secondary | ICD-10-CM

## 2010-10-16 MED ORDER — ALBUTEROL SULFATE HFA 108 (90 BASE) MCG/ACT IN AERS: 2.0000 | INHALATION_SPRAY | Freq: Four times a day (QID) | RESPIRATORY_TRACT | Status: DC | PRN

## 2010-10-16 MED ORDER — HYDROCODONE-HOMATROPINE 5-1.5 MG/5ML PO SYRP: 5.0000 mL | ORAL_SOLUTION | Freq: Four times a day (QID) | ORAL | Status: DC | PRN

## 2010-10-16 NOTE — Assessment & Plan Note (Signed)
Requests samples of levitra, none available.  Provided with sample of cialis 10mg  (#3) advised to use max once daily as longer acting.  Pt has appt with urology scheduled 4/19.

## 2010-10-16 NOTE — Progress Notes (Signed)
  Subjective:    Patient ID: Todd Owens, male    DOB: 1966/03/19, 45 y.o.   MRN: [id number]  HPI CC: sick  1 wk h/o feeling ill.  Started with vomiting.  Took day off.  Felt better.  Then got sick again 4 d ago.  Terrible cough, dry.  Not productive.  Feels like losing voice.  + body aches with coughing.  + subjective fever.  Mild sore throat.  Tried theraflu, dimetapp.  Not helping.  Also using old cough syrup.    No abd pain, no more n/v/d, HA, ear pain or tooth pain.  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  + sick contacts - wife and son's friends.  No h/o asthma, no smokers at home.  Review of Systems Per HPI    Objective:   Physical Exam  Vitals reviewed. Constitutional: He appears well-developed and well-nourished. No distress.  HENT:  Head: Normocephalic and atraumatic.  Right Ear: External ear normal.  Left Ear: External ear normal.  Nose: Nose normal.  Mouth/Throat: Oropharynx is clear and moist. No oropharyngeal exudate.  Eyes: Conjunctivae and EOM are normal. Pupils are equal, round, and reactive to light. No scleral icterus.  Neck: Normal range of motion. Neck supple.  Cardiovascular: Normal rate, regular rhythm, normal heart sounds and intact distal pulses.   No murmur heard. Pulmonary/Chest: Effort normal. No respiratory distress. He has wheezes (RML wheezing, exp.  mild). He has no rales.  Lymphadenopathy:    He has no cervical adenopathy.  Skin: Skin is warm and dry. No rash noted.          Assessment & Plan:

## 2010-10-16 NOTE — Assessment & Plan Note (Signed)
Anticipate viral URTI vs early bronchitis.  Did hear some wheezing on exam today, ? RAD.  Noted normal PFTs 2007.  Provided with script for albuterol as well as hydromet. Red flags to return discussed.

## 2010-10-16 NOTE — Patient Instructions (Signed)
Sounds like you have a viral upper respiratory infection. Antibiotics are not needed for this.  Viral infections usually take 7-10 days to resolve.  The cough can last 4 weeks to go away. Use medication as prescribed: hydromet for cough.  Albuterol inhaler to help breathing as needed shortness of breath. Push fluids and plenty of rest. May continue dimetapp. Please return if you are not improving as expected, or if you have high fevers (>101.5) or difficulty swallowing or worsening productive cough. Call clinic with questions.  Pleasure to see you today.

## 2010-10-18 ENCOUNTER — Emergency Department (HOSPITAL_COMMUNITY): Payer: BC Managed Care – PPO

## 2010-10-18 ENCOUNTER — Emergency Department (HOSPITAL_COMMUNITY)
Admission: EM | Admit: 2010-10-18 | Discharge: 2010-10-19 | Disposition: A | Discharge status: Home / Self Care | Payer: BC Managed Care – PPO | Attending: Emergency Medicine | Admitting: Emergency Medicine

## 2010-10-18 DIAGNOSIS — R059 Cough, unspecified: Secondary | ICD-10-CM | POA: Insufficient documentation

## 2010-10-18 DIAGNOSIS — H9209 Otalgia, unspecified ear: Secondary | ICD-10-CM | POA: Insufficient documentation

## 2010-10-18 DIAGNOSIS — K1120 Sialoadenitis, unspecified: Secondary | ICD-10-CM | POA: Insufficient documentation

## 2010-10-18 DIAGNOSIS — K219 Gastro-esophageal reflux disease without esophagitis: Secondary | ICD-10-CM | POA: Insufficient documentation

## 2010-10-18 DIAGNOSIS — J40 Bronchitis, not specified as acute or chronic: Secondary | ICD-10-CM | POA: Insufficient documentation

## 2010-10-18 DIAGNOSIS — M542 Cervicalgia: Secondary | ICD-10-CM | POA: Insufficient documentation

## 2010-10-18 DIAGNOSIS — E785 Hyperlipidemia, unspecified: Secondary | ICD-10-CM | POA: Insufficient documentation

## 2010-10-18 DIAGNOSIS — R221 Localized swelling, mass and lump, neck: Secondary | ICD-10-CM | POA: Insufficient documentation

## 2010-10-18 DIAGNOSIS — E669 Obesity, unspecified: Secondary | ICD-10-CM | POA: Insufficient documentation

## 2010-10-18 DIAGNOSIS — R05 Cough: Secondary | ICD-10-CM | POA: Insufficient documentation

## 2010-10-18 DIAGNOSIS — R220 Localized swelling, mass and lump, head: Secondary | ICD-10-CM | POA: Insufficient documentation

## 2010-10-18 LAB — CBC
Hemoglobin: 14 g/dL (ref 13.0–17.0)
MCH: 33.6 pg (ref 26.0–34.0)
MCHC: 36 g/dL (ref 30.0–36.0)
MCV: 93.3 fL (ref 78.0–100.0)
RBC: 4.17 MIL/uL — ABNORMAL LOW (ref 4.22–5.81)

## 2010-10-18 LAB — DIFFERENTIAL
Basophils Relative: 1 % (ref 0–1)
Eosinophils Absolute: 0.1 10*3/uL (ref 0.0–0.7)
Lymphs Abs: 2.2 10*3/uL (ref 0.7–4.0)
Monocytes Absolute: 0.3 10*3/uL (ref 0.1–1.0)
Monocytes Relative: 5 % (ref 3–12)
Neutro Abs: 2.4 10*3/uL (ref 1.7–7.7)

## 2010-10-18 LAB — POCT I-STAT, CHEM 8
BUN: 10 mg/dL (ref 6–23)
Chloride: 98 mEq/L (ref 96–112)
Potassium: 3.7 mEq/L (ref 3.5–5.1)
Sodium: 138 mEq/L (ref 135–145)
TCO2: 30 mmol/L (ref 0–100)

## 2010-10-18 MED ORDER — IOHEXOL 300 MG/ML  SOLN
100.0000 mL | Freq: Once | INTRAMUSCULAR | Status: AC | PRN
  Administered 2010-10-18: 100 mL via INTRAVENOUS

## 2010-10-20 ENCOUNTER — Telehealth: Payer: Self-pay | Admitting: *Deleted

## 2010-10-20 NOTE — Telephone Encounter (Signed)
[facility] Triage Call Report Triage Record Num: [id number] Operator: April Finney Patient Name: [name] Call Date & Time: 10/18/2010 4:14:40PM Patient Phone: [phone] PCP: Kerby Nora Patient Gender: Male PCP Fax : [phone] Patient DOB: May 24, 1966 Practice Name: [facility] Reason for Call: [name], calling regarding sudden onset of swelling on side of face that goes from temple and down his neck and is very painful. Denies any headach or sore throat. Ear does sound like he has water in his ear. See in ED care advice given. Protocol(s) Used: Face Pain or Swelling Recommended Outcome per Protocol: See ED Immediately Reason for Outcome: Rapid onset of generalized swelling of face or neck (other than glands or lymph nodes) Care Advice: ~ Another adult should drive. Call EMS 911 if sudden worsening of breathing problems, struggling to breathe, high pitched noise when breathing in (stridor), unable to speak, grasping at throat, or panic/anxiety because of breathing problems. ~ ~ IMMEDIATE ACTION Write down provider's name. List or place the following in a bag for transport with the patient: current prescription and/or nonprescription medications; alternative treatments, therapies and medications; and street drugs. ~ May have clear liquids (such as water, clear fruit juices without pulp, soda, tea or coffee without dairy or non-dairy creamer, clear broth or bouillon, oral hydration solution, or plain gelatin, fruit ices/popsicles, hard candy) but do not eat solid foods before being seen by your provider. ~ ~ Place person in a position of comfort and loosen tight clothing. 10/18/2010 4:36:10PM Page 1 of 1 [email or web address]

## 2010-10-23 ENCOUNTER — Ambulatory Visit (INDEPENDENT_AMBULATORY_CARE_PROVIDER_SITE_OTHER): Payer: BC Managed Care – PPO | Admitting: Family Medicine

## 2010-10-23 ENCOUNTER — Encounter: Payer: Self-pay | Admitting: Family Medicine

## 2010-10-23 VITALS — BP 120/90 | HR 68 | Temp 97.6°F | Ht 69.0 in | Wt 322.4 lb

## 2010-10-23 DIAGNOSIS — H6690 Otitis media, unspecified, unspecified ear: Secondary | ICD-10-CM

## 2010-10-23 DIAGNOSIS — K1120 Sialoadenitis, unspecified: Secondary | ICD-10-CM

## 2010-10-23 NOTE — Progress Notes (Signed)
Wheezing really bad  R parotiditis  Oral ABX.   Very pleasant 45 year old male, here in followup for ER visit and hospitalization for acute parotiditis, status post IV antibiotics, currently on oral Augmentin. His ears as well as his right jaw is dramatically improved compared to the weekend.  He still feels stuffy and has some congestion in ears ears, but otherwise he is dramatically improved.  ROS: GEN: Acute illness details above GI: Tolerating PO intake GU: maintaining adequate hydration and urination Pulm: No SOB Interactive and getting along well at home.  Otherwise, ROS is as per the HPI.  Gen: WDWN, NAD; A & O x3, cooperative. Pleasant.Globally Non-toxic HEENT: Normocephalic and atraumatic. Throat clear, w/o exudate, R TM + fluid, serous, L TM - good landmarks, + fluid present. rhinnorhea.  MMM Frontal sinuses: NT Max sinuses: NT NECK: Anterior cervical  LAD is absent CV: RRR, No M/G/R, cap refill <2 sec PULM: Breathing comfortably in no respiratory distress. no wheezing, crackles, rhonchi EXT: No c/c/e PSYCH: Friendly, good eye contact MSK: Nml gait  A/P: Resolving parotiditis and resolving otitis media. Followup when necessary only.

## 2010-11-06 ENCOUNTER — Other Ambulatory Visit: Payer: Self-pay | Admitting: Family Medicine

## 2010-11-07 NOTE — Telephone Encounter (Signed)
Dr. Ermalene Searing, here is a call a nurse note from 4/2, I cant tell whether or not you saw it.

## 2010-11-07 NOTE — Telephone Encounter (Signed)
Reviewed.. Pt seen at ER.

## 2010-11-12 ENCOUNTER — Other Ambulatory Visit: Payer: Self-pay | Admitting: Family Medicine

## 2010-12-05 NOTE — Assessment & Plan Note (Signed)
[facility]                             [facility] OFFICE NOTE   NAME:Todd Owens, Todd Owens                        MRN:          [id number]  DATE:05/20/2006                            DOB:          Dec 24, 1965    CHIEF COMPLAINT:  A 45 year old white male here to establish new doctor.   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  His main concern for today is the  fact that the physical picked up that he has elevated cholesterol.  He has  been told this in the past and had previously been on a statin but it caused  severe shoulder ache which resolved 100% once he stopped.  He would like to  try diet and exercise to reduce his cholesterol if possible.  He has a  strong family history of heart disease, but does not smoke and does not have  any personal history of coronary artery disease,  diabetes or peripheral  vascular disease.  He denies any chest pain or shortness of breath.   REVIEW OF SYSTEMS:  Otherwise negative.   PAST MEDICAL HISTORY:  Hypercholesterolemia.   HOSPITALIZATIONS, SURGERIES, PROCEDURES:  1. 2003 back surgery.  2. 2005 knee surgery.   FAMILY HISTORY:  Father alive at age 42 with heart attack at age 63 as well  as stroke later in life.  Mother deceased at age 30 with pacemaker and renal  failure.  He has 1 brother and 3 sisters, 1 of his sisters had a massive  heart attack at age 37.  There is no diabetes history in his family.  There  is no cancer in his family.   SOCIAL HISTORY:  He works for [employer] in [place] but is  also a Naval architect.  He has been married for 17 years and has 4  children who are healthy. He does not get any exercise other than at the  fire department.  He eats 2 meals a day, frequently skips breakfast.  He  states a typical lunch and dinner would be salad and then chicken nuggets at  night.  He is a former  smoker with 20 pack year history but does not  currently smoke.  He denies alcohol and drug use.   PHYSICAL EXAMINATION:  VITAL SIGNS:  Height 72 inches, weight 310 making BMI  approximately 40.  Blood pressure 142/88, pulse 72, temperature 98.1.  GENERAL:  Obese appearing male in no apparent distress.  HEENT:  PERRLA, extraocular muscles intact.  Oropharynx clear.  Tympanic  membranes clear.  Nares clear.  No thyromegaly, no lymphadenopathy  supraclavicular, cervical.  CARDIOVASCULAR: Regular rate and rhythm without murmurs, rubs or gallops.  Normal PMI.  2+ peripheral pulses.  No peripheral edema.  PULMONARY:  Clear to auscultation bilaterally.  No wheezes, rales or  rhonchi.  No cyanosis, no clubbing.  ABDOMEN:  Soft, nontender.  Normoactive bowel sounds.  No  hepatosplenomegaly.  MUSCULOSKELETAL:  Strength 5/5 in upper and lower extremities.  Full range  of  motion in all joints.  NEUROLOGIC:  Cranial nerves II through XII grossly intact, alert and  oriented x3.   ASSESSMENT AND PLAN:  1. Hypercholesterolemia:  Todd Owens's LDL is at 155 and his goal is less      than 130.  We did discuss beginning improvement on his cholesterol by      working on diet, weight loss and exercise.  We discussed this in detail      and the patient was given information about cholesterol as well as      weight loss and beginning an exercise program.  He would like to avoid      medication if possible, but I did discuss that if his cholesterol is      not at goal at the next check in 3 months we should reconsider      medication, if at least something other than a statin.  2. 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  He is unsure of his last      tetanus shot.     Kerby Nora, MD    AB/MedQ  DD: 05/24/2006  DT: 05/24/2006  Job #: [id number]

## 2010-12-05 NOTE — Op Note (Signed)
. [facility]  Patient:    Todd Owens, Todd Owens Visit Number: [id number] MRN: [id number]          Service Type: SUR Location: 5500 [id number] Attending Physician:  Cristi Loron Dictated by:   Cristi Loron, M.D. Proc. Date: 12/26/01 Admit Date:  12/22/2001 Discharge Date: 12/23/2001                             Operative Report  PREOPERATIVE DIAGNOSIS:  Right extraforaminal, i.e. 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  POSTOPERATIVE DIAGNOSIS:  Right extraforaminal, i.e. 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  OPERATION PERFORMED:  Right extraforaminal L4-5 microdiskectomy (far lateral microdiskectomy) using microdissection.  SURGEON:  Cristi Loron, M.D.  ASSISTANT:  Stefani Dama, M.D.  ANESTHESIA:  General endotracheal.  ESTIMATED BLOOD LOSS:  150 cc.  SPECIMENS:  None.  DRAINS:  None.  COMPLICATIONS:  None.  INDICATIONS FOR PROCEDURE:  The patient is a 45 year old white male who has suffered from severe right leg pain consistent with a right L4  radiculopathy. He has failed medical management and was worked up with a lumbar MRI which demonstrated a foraminal and extraforaminal herniated disk at L4-5 on the right.  He went on to be treated with steroid injections, nerve blocks, etc. which did not help significantly.  He therefore weighed the risks, benefits and alternatives of surgery and decided to proceed with an extraforaminal diskectomy at L4-5.  DESCRIPTION OF PROCEDURE:  The patient was brought to the operating room by the anesthesia team.  General endotracheal anesthesia was induced.  The patient was then turned to a prone position on a Wilson frame.  The lumbosacral region was then shaved and prepared with Betadine scrub and Betadine  solution.  Sterile drapes were applied.  I then injected the area to be incised with Marcaine with epinephrine solution, then using scalpel to make a midline linear incision over the L4-5 interspace.  I used electrocautery to dissect down to the thoracolumbar fascia and divide the fascia distally to the right of midline performing a right-sided subperiosteal dissection stripping the paraspinous musculature from the right spinous process and lamina of L4 and L5.  I inserted the Shadowline retractor for exposure, then obtained an intraoperative radiograph to confirm my location.  I then used electrocautery to detach the fascia from the lateral aspect of the right L4-5 facet joint and then brought the operative microscope into the field and under its magnification and illumination, I completed a microdissection/decompression. I used a high speed drill to drill off the lateral edge of the right L4-5 facet joint and the lateral aspect of the right L4 pars region.  This exposed the underlying interspinous ligament which I incised with a 15 blade scalpel and removed with a Kerrison punch.  I then used microdissection to dissect through the intertransverse muscle and identified the exiting L4 nerve roots. I then identified the herniated disk just caudal to it.  Dr. Danielle Dess gently retracted the nerve root out of the way with a DErrico retractor and then I incised the extraforaminal L4-5 disk and performed a partial intervertebral diskectomy using pituitary forceps and the Scoville and Epstein curets.  I then palpated up the neural foramen and I removed several large free fragments of herniated disk which were just ventral to the L4 nerve  root as it exited through the neural foramen.  At this point I had good decompression of the nerve root and I palpated along the exit route of the L4 nerve root from intraspinal to well into paraspinous musculature and noted nerve root was well decompressed.  ___  palpated with a coronary dilator and noted the nerve root was well decompressed.  I then achieved stringent hemostasis using bipolar electrocautery.  I then removed the Shadowline retractor and reapproximated the patients thoracolumbar fascia with interrupted #1 Vicryl sutures, subcutaneous tissues with interrupted 2-0 Vicryl suture and the skin with Steri-Strips and benzoin.  The wound was then coated with bacitracin ointment. Sterile dressings were applied and the drapes were removed.  The patient was subsequently returned to the supine position where he was extubated by the anesthesia team and transported to the post anesthesia care unit in stable condition.  Sponge, needle and instrument counts were correct at the end of this case. Dictated by:   Cristi Loron, M.D. Attending Physician:  Tressie Stalker D DD:  12/23/01 TD:  12/26/01 Job: [id number]

## 2010-12-05 NOTE — Procedures (Signed)
NAME:  Todd Owens, Todd Owens NO.:  [id number]   MEDICAL RECORD NO.:  [id number]          PATIENT TYPE:  OUT   LOCATION:  SLEEP CENTER                 FACILITY:  [facility]   PHYSICIAN:  Barbaraann Share, MD,FCCPDATE OF BIRTH:  Dec 02, 1965   DATE OF STUDY:                            NOCTURNAL POLYSOMNOGRAM   REFERRING PHYSICIAN:  Barbaraann Share, MD,FCCP   INDICATION FOR STUDY:  Hypersomnia with sleep apnea.  The patient  returns for pressure optimization.   EPWORTH SLEEPINESS SCORE:  6.   SLEEP ARCHITECTURE:  The patient had a total sleep time of 257 minutes  with variable slow-wave sleep and decreased REM.  Sleep onset latency  was normal at 10 minutes and REM onset was normal as well at 86 minutes.  Sleep efficiency was decreased at 76%.   RESPIRATORY DATA:  The patient underwent a CPAP titration study with a  large Mirage micronasal mask.  The CPAP pressure was titrated up to a  final level of 12 cm of water with good control of his obstructive  events.  He did have breakthrough in his final REM and therefore a  pressure of 13 cm may be more optimal.  There was no snoring noted on  his final pressure.   OXYGEN DATA:  There was transient O2 desaturation as low as 84% prior to  optimal CPAP being reached.   CARDIAC DATA:  No clinically significant arrhythmias were noted.   MOVEMENT-PARASOMNIA:  None.   IMPRESSIONS-RECOMMENDATIONS:  1. Good control of previously documented obstructive sleep apnea with      12 cm of CPAP pressure delivered by a large Mirage micronasal mask.      There were REM breakthrough events at the end of the study and      therefore a more optimal pressure is probably closer to 13 cm.  2. The patient should also be encouraged to work aggressively on      weight loss.      Barbaraann Share, MD,FCCP  Diplomate, American Board of Sleep  Medicine  Electronically Signed    [facility]/MEDQ  D:  12/30/2007 09:16:45  T:  12/30/2007 09:46:27  Job:   [id number]

## 2011-01-12 ENCOUNTER — Other Ambulatory Visit: Payer: Self-pay | Admitting: Family Medicine

## 2011-02-11 ENCOUNTER — Other Ambulatory Visit: Payer: Self-pay | Admitting: Family Medicine

## 2011-03-18 LAB — PULMONARY FUNCTION TEST

## 2011-03-20 ENCOUNTER — Encounter: Payer: Self-pay | Admitting: Family Medicine

## 2011-03-20 ENCOUNTER — Other Ambulatory Visit: Payer: Self-pay | Admitting: Family Medicine

## 2011-03-20 ENCOUNTER — Ambulatory Visit (INDEPENDENT_AMBULATORY_CARE_PROVIDER_SITE_OTHER): Payer: BC Managed Care – PPO | Admitting: Family Medicine

## 2011-03-20 VITALS — BP 130/80 | HR 59 | Temp 98.6°F | Wt 325.0 lb

## 2011-03-20 DIAGNOSIS — R9439 Abnormal result of other cardiovascular function study: Secondary | ICD-10-CM

## 2011-03-20 DIAGNOSIS — I10 Essential (primary) hypertension: Secondary | ICD-10-CM

## 2011-03-20 MED ORDER — LOSARTAN POTASSIUM 50 MG PO TABS: 50.0000 mg | ORAL_TABLET | Freq: Every day | ORAL | Status: DC

## 2011-03-20 NOTE — Assessment & Plan Note (Addendum)
Good BP recovery time.  BP did go above 220 during study.  NO CP or EKG changes during stress test. Will discuss with cardiologist to make sure no further eval needed.

## 2011-03-20 NOTE — Assessment & Plan Note (Signed)
Baseline Bp seems to be running high despite good control today.  Will add losartan and follow up in 7-10 days. Check Cr at that time.

## 2011-03-20 NOTE — Progress Notes (Signed)
  Subjective:    Patient ID: Todd Owens, male    DOB: 30-Mar-1966, 45 y.o.   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 Not sure what size cuff used. He needs large cuff to be accurate.   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 BP during recovery returned to 154/84. He is not sure what size cuff they use, but they do have three cuff sizes. EKG was normal...no sign of ischemia. Here today BP at goal <140/90. He is on maxide and takes this regularly.   LAst few OV here.. Diastolic occ above 90. Using medication without problems or lightheadedness:  Chest pain with exertion:None Edema:None Short of breath:None Average home BPs: Yes, usually <140/90, but occ borderline above this. Other issues: He has noted some posterior headache, later in the day.    Review of Systems  Constitutional: Negative for fever, chills and fatigue.  HENT: Negative for ear pain.   Eyes: Negative for pain.  Respiratory: Negative for shortness of breath and wheezing.   Cardiovascular: Negative for chest pain, palpitations and leg swelling.  Gastrointestinal: Negative for abdominal pain, diarrhea, constipation and blood in stool.       Objective:   Physical Exam  Constitutional: Vital signs are normal. He appears well-developed and well-nourished.       Morbidly obese in NAD.   HENT:  Head: Normocephalic.  Right Ear: Hearing normal.  Left Ear: Hearing normal.  Nose: Nose normal.  Mouth/Throat: Oropharynx is clear and moist and mucous membranes are normal.  Neck: Trachea normal. Carotid bruit is not present. No mass and no thyromegaly present.  Cardiovascular: Normal rate, regular rhythm and normal pulses.  Exam reveals no gallop, no distant heart sounds and no friction rub.   No murmur heard.      No peripheral edema  Pulmonary/Chest: Effort normal and breath  sounds normal. No respiratory distress.  Skin: Skin is warm, dry and intact. No rash noted.  Psychiatric: He has a normal mood and affect. His speech is normal and behavior is normal. Thought content normal.          Assessment & Plan:

## 2011-03-20 NOTE — Patient Instructions (Addendum)
Add losartan to maxide.  Follow BP at home, bring measurements and bring to next OV.  I will discuss your stress test results with our cardiologist further to determine if any further testing needed. Will discuss further at next OV.

## 2011-04-02 ENCOUNTER — Encounter: Payer: Self-pay | Admitting: Family Medicine

## 2011-04-02 ENCOUNTER — Ambulatory Visit (INDEPENDENT_AMBULATORY_CARE_PROVIDER_SITE_OTHER): Payer: BC Managed Care – PPO | Admitting: Family Medicine

## 2011-04-02 VITALS — BP 130/78 | HR 65 | Temp 98.7°F | Wt 324.8 lb

## 2011-04-02 DIAGNOSIS — R9439 Abnormal result of other cardiovascular function study: Secondary | ICD-10-CM

## 2011-04-02 DIAGNOSIS — I10 Essential (primary) hypertension: Secondary | ICD-10-CM

## 2011-04-02 NOTE — Patient Instructions (Addendum)
Increase losartan to 100 mg daily. Continue to follow BP at home.. Record. Call me with natural testosterone treatment medication.. So I can look into it in your case. Stop at front to talk with [facility] for cardiac stress test.

## 2011-04-02 NOTE — Assessment & Plan Note (Signed)
Minimal improvement. Increase losartan to 100 mg daily. Follow BPs at home. Goal to push Bps as low as tolerated.

## 2011-04-02 NOTE — Progress Notes (Signed)
  Subjective:    Patient ID: Todd Owens, male    DOB: Jan 14, 1966, 45 y.o.   MRN: [id number]  HPI  Hypertension:  Recent elevated BP at home and during exercise stress test. On maxide,and now on losartan 50 mg in last week.  Using medication without problems or lightheadedness:  None Chest pain with exertion: None Edema:None Short of breath: in last week some mild occ shortness of breath with exertion. Average home BPs: 129-157/75-80, no clear association with why numbers are higher later in the day. Other issues: none    Review of Systems  Constitutional: Positive for fatigue. Negative for fever.  HENT: Negative for ear pain.   Eyes: Negative for pain.  Respiratory: Negative for cough and shortness of breath.   Cardiovascular: Negative for chest pain, palpitations and leg swelling.  Gastrointestinal: Negative for nausea, diarrhea, constipation, blood in stool and abdominal distention.  Genitourinary: Negative for dysuria.       Objective:   Physical Exam  Constitutional: Vital signs are normal. He appears well-developed and well-nourished.       Morbidly obese  HENT:  Head: Normocephalic.  Right Ear: Hearing normal.  Left Ear: Hearing normal.  Nose: Nose normal.  Mouth/Throat: Oropharynx is clear and moist and mucous membranes are normal.  Neck: Trachea normal. Carotid bruit is not present. No mass and no thyromegaly present.  Cardiovascular: Normal rate, regular rhythm and normal pulses.  Exam reveals no gallop, no distant heart sounds and no friction rub.   No murmur heard.      No peripheral edema  Pulmonary/Chest: Effort normal and breath sounds normal. No respiratory distress.  Skin: Skin is warm, dry and intact. No rash noted.  Psychiatric: He has a normal mood and affect. His speech is normal and behavior is normal. Thought content normal.          Assessment & Plan:

## 2011-04-02 NOTE — Assessment & Plan Note (Signed)
Discussed case with Dr. Mariah Milling at [facility].  Given HR 80-85 % not reached, the stress test was inadequate. He need additional stress testing to clear him for ischemia. We will send him for a chemical stress test as his BP is not much improved at this time. Excessive BP response was likely due to deconditioning and obesity, given normal recovery time. See below HTN treatment.  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

## 2011-04-03 LAB — BASIC METABOLIC PANEL
CO2: 31 mEq/L (ref 19–32)
Calcium: 9.4 mg/dL (ref 8.4–10.5)
Creatinine, Ser: 1 mg/dL (ref 0.4–1.5)
GFR: 83.92 mL/min (ref >60.00)
Glucose, Bld: 97 mg/dL (ref 70–99)
Sodium: 139 mEq/L (ref 135–145)

## 2011-04-13 ENCOUNTER — Other Ambulatory Visit: Payer: Self-pay | Admitting: *Deleted

## 2011-04-13 MED ORDER — LOSARTAN POTASSIUM 100 MG PO TABS: 100.0000 mg | ORAL_TABLET | Freq: Every day | ORAL | Status: DC

## 2011-04-13 NOTE — Telephone Encounter (Signed)
Pt's wife states pt needs a new script, dose was increased at his last office visit.

## 2011-04-21 ENCOUNTER — Other Ambulatory Visit: Payer: Self-pay | Admitting: Family Medicine

## 2011-04-22 ENCOUNTER — Encounter (HOSPITAL_COMMUNITY): Payer: BC Managed Care – PPO | Admitting: Radiology

## 2011-04-27 ENCOUNTER — Telehealth: Payer: Self-pay | Admitting: *Deleted

## 2011-04-27 NOTE — Telephone Encounter (Signed)
[facility]/Cardiology called stating that patient is having a stress test done tomorrow and they need a copy of recent EKG and copy of previous abnormal stress test if available. Please fax to [phone] as soon as possible.

## 2011-04-27 NOTE — Telephone Encounter (Signed)
Faxed to [facility]

## 2011-04-28 ENCOUNTER — Ambulatory Visit (HOSPITAL_COMMUNITY): Payer: BC Managed Care – PPO | Attending: Family Medicine | Admitting: Radiology

## 2011-04-28 VITALS — Ht 72.0 in | Wt 318.0 lb

## 2011-04-28 DIAGNOSIS — R0989 Other specified symptoms and signs involving the circulatory and respiratory systems: Secondary | ICD-10-CM

## 2011-04-28 DIAGNOSIS — R9439 Abnormal result of other cardiovascular function study: Secondary | ICD-10-CM | POA: Insufficient documentation

## 2011-04-28 MED ORDER — REGADENOSON 0.4 MG/5ML IV SOLN
0.4000 mg | Freq: Once | INTRAVENOUS | Status: AC
  Administered 2011-04-28: 0.4 mg via INTRAVENOUS

## 2011-04-28 MED ORDER — TECHNETIUM TC 99M TETROFOSMIN IV KIT
33.0000 | PACK | Freq: Once | INTRAVENOUS | Status: AC | PRN
  Administered 2011-04-28: 33 via INTRAVENOUS

## 2011-04-28 NOTE — Progress Notes (Signed)
[facility] SITE 3 NUCLEAR MED [address] [place] [address] [phone]  Cardiology Nuclear Med Study  Todd Owens is a 45 y.o. 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 Cardiac Risk Factors: Family History - CAD, History of Smoking, Hypertension, Lipids and Obesity  Symptoms:  DOE and Fatigue   Nuclear Pre-Procedure Caffeine/Decaff Intake:  None NPO After: 7:30pm   Lungs:  Clear.  O2 SAT 97% on RA. IV 0.9% NS with Angio Cath:  20g  IV Site: R Hand  IV Started by:  Bonnita Levan, RN  Chest Size (in):  56 Cup Size: n/a  Height: 6' (1.829 m)  Weight:  318 lb (144.244 kg)  BMI:  Body mass index is 43.13 kg/(m^2). Tech Comments:  N/A    Nuclear Med Study 1 or 2 day study: 2 day  Stress Test Type:  Treadmill/Lexiscan  Reading MD: Arvilla Meres, MD  Order Authorizing Provider:  Malena Peer, MD  Resting Radionuclide: Technetium 45m Tetrofosmin  Resting Radionuclide Dose: 33.0 mCi   Stress Radionuclide:  Technetium 26m Tetrofosmin  Stress Radionuclide Dose: 33.0 mCi           Stress Protocol Rest HR: 60 Stress HR: 109  Rest BP: 120/66 Stress BP: 128/63  Exercise Time (min): 2:00 METS: n/a   Predicted Max HR: 175 bpm % Max HR: 62.29 bpm Rate Pressure Product: 10272   Dose of Adenosine (mg):  n/a Dose of Lexiscan: 0.4 mg  Dose of Atropine (mg): n/a Dose of Dobutamine: n/a mcg/kg/min (at max HR)  Stress Test Technologist: Smiley Houseman, CMA-N  Nuclear Technologist:  Domenic Polite, CNMT     Rest Procedure:  Myocardial perfusion imaging was performed at rest 45 minutes following the intravenous administration of Technetium 88m Tetrofosmin.  Rest ECG: No acute changes.  Stress Procedure:  The patient received IV Lexiscan 0.4 mg over 15-seconds with concurrent low level exercise and then  Technetium 79m Tetrofosmin was injected at 30-seconds while the patient continued walking one more minute.  There were no significant changes with Lexiscan.  Quantitative spect images were obtained after a 45-minute delay.  Stress ECG: No significant ST segment change suggestive of ischemia.  QPS Raw Data Images:  Acquisition technically good; Mild LVE. Stress Images:  Normal homogeneous uptake in all areas of the myocardium. Rest Images:  Normal homogeneous uptake in all areas of the myocardium. Subtraction (SDS):  No evidence of ischemia. Transient Ischemic Dilatation (Normal <1.22):  1.12 Lung/Heart Ratio (Normal <0.45):  0.41  Quantitative Gated Spect Images QGS EDV:  148 ml QGS ESV:  68 ml QGS cine images:  NL LV Function; NL Wall Motion QGS EF: 54%  Impression Exercise Capacity:  Lexiscan with low level exercise. BP Response:  Normal blood pressure response. Clinical Symptoms:  No chest pain. ECG Impression:  No significant ST segment change suggestive of ischemia. Comparison with Prior Nuclear Study: No significant change from previous study  Overall Impression:  Normal stress nuclear study.   Olga Millers

## 2011-05-04 ENCOUNTER — Ambulatory Visit (HOSPITAL_COMMUNITY): Payer: BC Managed Care – PPO | Attending: Family Medicine | Admitting: Radiology

## 2011-05-04 DIAGNOSIS — R0989 Other specified symptoms and signs involving the circulatory and respiratory systems: Secondary | ICD-10-CM

## 2011-05-04 DIAGNOSIS — Z8249 Family history of ischemic heart disease and other diseases of the circulatory system: Secondary | ICD-10-CM

## 2011-05-04 DIAGNOSIS — R5383 Other fatigue: Secondary | ICD-10-CM

## 2011-05-04 MED ORDER — TECHNETIUM TC 99M TETROFOSMIN IV KIT
33.0000 | PACK | Freq: Once | INTRAVENOUS | Status: AC | PRN
  Administered 2011-05-04: 33 via INTRAVENOUS

## 2011-05-08 ENCOUNTER — Encounter: Payer: Self-pay | Admitting: Family Medicine

## 2011-05-08 ENCOUNTER — Ambulatory Visit (INDEPENDENT_AMBULATORY_CARE_PROVIDER_SITE_OTHER): Payer: BC Managed Care – PPO | Admitting: Family Medicine

## 2011-05-08 DIAGNOSIS — R9439 Abnormal result of other cardiovascular function study: Secondary | ICD-10-CM

## 2011-05-08 DIAGNOSIS — I10 Essential (primary) hypertension: Secondary | ICD-10-CM

## 2011-05-08 NOTE — Assessment & Plan Note (Signed)
Cleared to return to fire department.

## 2011-05-08 NOTE — Assessment & Plan Note (Signed)
Great control on higher dose of losartan and HCTZ. Cleared for return to fire department.

## 2011-05-08 NOTE — Progress Notes (Signed)
  Subjective:    Patient ID: Todd Owens, male    DOB: [birth year], 45 y.o.   MRN: [id number]  HPI  Hypertension:   Well controlleld BP on higher dose of losartan and maxide.  Lower baseline Using medication without problems or lightheadedness:  none Chest pain with exertion:None Edema:None Short of breath:None Average home BPs: at goal. 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  Nuclear stress test performed given HR 80-85 % not reached on exercise stress test, exercise test was inadequate as stopped early. He needed additional stress testing to clear him for ischemia.  10/15 nuclear stress test normal.. At stress BP 128/63, HR 109  Excessive BP response was likely due to deconditioning and obesity, given normal recovery time.       Review of Systems  Constitutional: Negative for fever.  Respiratory: Negative for shortness of breath and wheezing.   Cardiovascular: Negative for chest pain and palpitations.  Gastrointestinal: Negative for diarrhea, constipation and abdominal distention.       Objective:   Physical Exam  Constitutional: Vital signs are normal. He appears well-developed and well-nourished.       Morbidly obese  HENT:  Head: Normocephalic.  Right Ear: Hearing normal.  Left Ear: Hearing normal.  Nose: Nose normal.  Mouth/Throat: Oropharynx is clear and moist and mucous membranes are normal.  Neck: Trachea normal. Carotid bruit is not present. No mass and no thyromegaly present.  Cardiovascular: Normal rate, regular rhythm and normal pulses.  Exam reveals no gallop, no distant heart sounds and no friction rub.   No murmur heard.      No peripheral edema  Pulmonary/Chest: Effort normal and breath sounds normal. No respiratory distress.  Skin: Skin is warm, dry and intact. No rash noted.  Psychiatric: He has a normal mood and affect. His speech is normal and behavior is normal. Thought content  normal.          Assessment & Plan:

## 2011-05-08 NOTE — Patient Instructions (Signed)
Return in CPX in 11/2010 with fasting labs prior.

## 2011-05-25 ENCOUNTER — Other Ambulatory Visit: Payer: Self-pay | Admitting: *Deleted

## 2011-05-25 MED ORDER — TRIAMTERENE-HCTZ 37.5-25 MG PO TABS: 1.0000 | ORAL_TABLET | Freq: Every day | ORAL | Status: DC

## 2011-06-23 ENCOUNTER — Other Ambulatory Visit: Payer: Self-pay | Admitting: Family Medicine

## 2011-09-01 ENCOUNTER — Other Ambulatory Visit: Payer: Self-pay | Admitting: Family Medicine

## 2011-10-05 ENCOUNTER — Telehealth: Payer: Self-pay | Admitting: *Deleted

## 2011-10-05 NOTE — Telephone Encounter (Signed)
PA form in Dr. Cyndie Chime IN box.

## 2011-10-05 NOTE — Telephone Encounter (Signed)
Received fax stating that PA is needed for Omeprazole 40mg .  Called [employer] at [phone] and rep will fax paper work in about 30 minutes.

## 2011-10-08 NOTE — Telephone Encounter (Signed)
Spoke with patient and he has tried OTC Prilosec and Ranitidine both with no relief.  PA form in Dr. Cyndie Chime IN box.

## 2011-10-08 NOTE — Telephone Encounter (Signed)
Left message on cell phone voicemail for patient to call back and give the names of other medications he has tried before Omeprazole.

## 2011-10-19 ENCOUNTER — Other Ambulatory Visit: Payer: Self-pay | Admitting: *Deleted

## 2011-10-19 MED ORDER — OMEPRAZOLE 40 MG PO CPDR: 40.0000 mg | DELAYED_RELEASE_CAPSULE | Freq: Every day | ORAL | Status: DC

## 2011-12-08 ENCOUNTER — Other Ambulatory Visit: Payer: Self-pay | Admitting: *Deleted

## 2011-12-08 ENCOUNTER — Other Ambulatory Visit: Payer: Self-pay | Admitting: Family Medicine

## 2011-12-08 MED ORDER — SIMVASTATIN 40 MG PO TABS: 40.0000 mg | ORAL_TABLET | Freq: Every day | ORAL | Status: DC

## 2011-12-08 NOTE — Telephone Encounter (Signed)
Received faxed refill request from pharmacy. Refill sent to pharmacy electronically. 

## 2011-12-11 ENCOUNTER — Encounter: Payer: Self-pay | Admitting: Family Medicine

## 2011-12-11 ENCOUNTER — Ambulatory Visit (INDEPENDENT_AMBULATORY_CARE_PROVIDER_SITE_OTHER): Payer: BC Managed Care – PPO | Admitting: Family Medicine

## 2011-12-11 VITALS — BP 120/72 | HR 66 | Temp 98.5°F | Ht 72.0 in | Wt 314.1 lb

## 2011-12-11 DIAGNOSIS — E785 Hyperlipidemia, unspecified: Secondary | ICD-10-CM

## 2011-12-11 DIAGNOSIS — I10 Essential (primary) hypertension: Secondary | ICD-10-CM

## 2011-12-11 DIAGNOSIS — E229 Hyperfunction of pituitary gland, unspecified: Secondary | ICD-10-CM

## 2011-12-11 DIAGNOSIS — E291 Testicular hypofunction: Secondary | ICD-10-CM

## 2011-12-11 DIAGNOSIS — G4733 Obstructive sleep apnea (adult) (pediatric): Secondary | ICD-10-CM

## 2011-12-11 LAB — COMPREHENSIVE METABOLIC PANEL
Albumin: 3.7 g/dL (ref 3.5–5.2)
BUN: 18 mg/dL (ref 6–23)
CO2: 27 mEq/L (ref 19–32)
Calcium: 8.9 mg/dL (ref 8.4–10.5)
Chloride: 104 mEq/L (ref 96–112)
GFR: 81.8 mL/min (ref >60.00)
Glucose, Bld: 105 mg/dL — ABNORMAL HIGH (ref 70–99)
Potassium: 3.9 mEq/L (ref 3.5–5.1)
Sodium: 140 mEq/L (ref 135–145)
Total Protein: 6.8 g/dL (ref 6.0–8.3)

## 2011-12-11 LAB — LIPID PANEL
Cholesterol: 136 mg/dL (ref 0–200)
LDL Cholesterol: 89 mg/dL (ref 0–99)
Triglycerides: 55 mg/dL (ref 0.0–149.0)

## 2011-12-11 NOTE — Assessment & Plan Note (Signed)
Well controlled. Continue current medication.  

## 2011-12-11 NOTE — Assessment & Plan Note (Signed)
Due for re-eval. 

## 2011-12-11 NOTE — Progress Notes (Signed)
Addended by: Alvina Chou on: 12/11/2011 09:28 AM   Modules accepted: Orders

## 2011-12-11 NOTE — Assessment & Plan Note (Signed)
On reheck was normal... Will recheck at today almost 1 year out to make sure still nml.

## 2011-12-11 NOTE — Progress Notes (Signed)
Subjective:    Patient ID: Todd Owens, male    DOB: 1966/04/22, 46 y.o.   MRN: [id number]  HPI  The patient is here for annual wellness exam and preventative care.    Hypertension:   Well controlled on losartan  Using medication without problems or lightheadedness: None Chest pain with exertion:None Edema:None Short of breath:None Average home BPs: Not checking Other issues:  Elevated Cholesterol: Due for re-eval. On zocor. Using medications without problems:None Muscle aches: None Diet compliance: Moderate, no weight loss Exercise: None, some occ walking with yard work Other complaints:   Has lost 15 lbs.  Hypogonadism: Saw Uro.. Could not afford testosterone supplement. Some prolactin levels elevated, but on recheck nml.  Review of Systems  Constitutional: Positive for fatigue. Negative for fever and unexpected weight change.  HENT: Negative for ear pain, congestion, sore throat, rhinorrhea, trouble swallowing and postnasal drip.   Eyes: Negative for pain.  Respiratory: Negative for cough, shortness of breath and wheezing.   Cardiovascular: Negative for chest pain, palpitations and leg swelling.  Gastrointestinal: Negative for nausea, abdominal pain, diarrhea, constipation and blood in stool.  Genitourinary: Negative for dysuria, urgency, hematuria, discharge, penile swelling, scrotal swelling, difficulty urinating, penile pain and testicular pain.  Skin: Negative for rash.  Neurological: Negative for syncope, weakness, light-headedness, numbness and headaches.  Psychiatric/Behavioral: Negative for behavioral problems and dysphoric mood. The patient is not nervous/anxious.        Objective:   Physical Exam  Constitutional: Vital signs are normal. He appears well-developed and well-nourished.  Non-toxic appearance. He does not appear ill. No distress.  HENT:  Head: Normocephalic and atraumatic.  Right Ear: Hearing, tympanic membrane, external ear and ear canal  normal.  Left Ear: Hearing, tympanic membrane, external ear and ear canal normal.  Nose: Nose normal.  Mouth/Throat: Uvula is midline, oropharynx is clear and moist and mucous membranes are normal.  Eyes: Conjunctivae, EOM and lids are normal. Pupils are equal, round, and reactive to light. No foreign bodies found.  Neck: Trachea normal, normal range of motion and phonation normal. Neck supple. Carotid bruit is not present. No mass and no thyromegaly present.  Cardiovascular: Normal rate, regular rhythm, S1 normal, S2 normal, intact distal pulses and normal pulses.  Exam reveals no gallop, no distant heart sounds and no friction rub.   No murmur heard.      No peripheral edema  Pulmonary/Chest: Effort normal and breath sounds normal. No respiratory distress. He has no wheezes. He has no rhonchi. He has no rales.  Abdominal: Soft. Normal appearance and bowel sounds are normal. There is no hepatosplenomegaly. There is no tenderness. There is no rebound, no guarding and no CVA tenderness. No hernia. Hernia confirmed negative in the right inguinal area and confirmed negative in the left inguinal area.  Genitourinary: Testes normal and penis normal. Right testis shows no mass and no tenderness. Left testis shows no mass and no tenderness. Uncircumcised. No paraphimosis or penile tenderness.  Lymphadenopathy:    He has no cervical adenopathy.       Right: No inguinal adenopathy present.       Left: No inguinal adenopathy present.  Neurological: He is alert. He has normal strength and normal reflexes. No cranial nerve deficit or sensory deficit. Gait normal.  Skin: Skin is warm, dry and intact. No rash noted.  Psychiatric: He has a normal mood and affect. His speech is normal and behavior is normal. Judgment and thought content normal.  Assessment & Plan:  The patient's preventative maintenance and recommended screening tests for an annual wellness exam were reviewed in full  today. Brought up to date unless services declined.  Counselled on the importance of diet, exercise, and its role in overall health and mortality. The patient's FH and SH was reviewed, including their home life, tobacco status, and drug and alcohol status.   Vaccine: Td 2011 Former smoker  Prostate, colon cancer screen not indicated yet

## 2011-12-11 NOTE — Patient Instructions (Signed)
Work on [employer], regular exercise and weight loss. We will call with lab results.

## 2011-12-12 LAB — PROLACTIN: Prolactin: 20.7 ng/mL — ABNORMAL HIGH (ref 2.1–17.1)

## 2011-12-18 ENCOUNTER — Telehealth: Payer: Self-pay

## 2011-12-18 DIAGNOSIS — E291 Testicular hypofunction: Secondary | ICD-10-CM

## 2011-12-18 DIAGNOSIS — E229 Hyperfunction of pituitary gland, unspecified: Secondary | ICD-10-CM

## 2011-12-18 NOTE — Telephone Encounter (Signed)
Message copied by Maurice Small on Fri Dec 18, 2011  2:15 PM ------      Message from: Brewer, Virginia E      Created: Mon Dec 14, 2011 12:52 AM       Notify pt blood sugar slightly elevated.       Cholesterol well controlled.       Testosterone remains low.       Prolactin again appears high... has he ever had MRI brain to evaluate for prolactinoma? If not .. I would recommend this give past and current prolactin level changes.

## 2011-12-18 NOTE — Telephone Encounter (Signed)
Spoke with patient, patient verbalized understanding of lab results. Patient states he never had a MRI and would like to consider an Open MRI, patient notes that if this is schedule after June 5,2013 he will only be available on Fridays, other wise he can adjust schedule if set up before June 5th.  Please advise on orders for MRI

## 2011-12-21 ENCOUNTER — Other Ambulatory Visit: Payer: Self-pay

## 2011-12-21 MED ORDER — SIMVASTATIN 40 MG PO TABS: 40.0000 mg | ORAL_TABLET | Freq: Every day | ORAL | Status: DC

## 2011-12-21 NOTE — Telephone Encounter (Signed)
[facility] request simvastatin 40 mg # 30 x 11.

## 2011-12-26 ENCOUNTER — Ambulatory Visit
Admission: RE | Admit: 2011-12-26 | Discharge: 2011-12-26 | Disposition: A | Discharge status: Home / Self Care | Payer: BC Managed Care – PPO | Source: Ambulatory Visit | Attending: Family Medicine | Admitting: Family Medicine

## 2011-12-26 DIAGNOSIS — E229 Hyperfunction of pituitary gland, unspecified: Secondary | ICD-10-CM

## 2011-12-26 DIAGNOSIS — E291 Testicular hypofunction: Secondary | ICD-10-CM

## 2012-01-13 ENCOUNTER — Other Ambulatory Visit: Payer: Self-pay | Admitting: *Deleted

## 2012-01-13 MED ORDER — TRIAMTERENE-HCTZ 37.5-25 MG PO TABS: 1.0000 | ORAL_TABLET | Freq: Every day | ORAL | Status: DC

## 2012-06-11 ENCOUNTER — Other Ambulatory Visit: Payer: Self-pay | Admitting: Family Medicine

## 2012-07-19 ENCOUNTER — Other Ambulatory Visit: Payer: Self-pay | Admitting: Family Medicine

## 2012-07-25 ENCOUNTER — Telehealth: Payer: Self-pay

## 2012-07-25 NOTE — Telephone Encounter (Signed)
[facility] faxed PA omeprazole; have approval thru 10/12/12. Spoke with pharmacist at [facility] and refill went thru.

## 2012-08-24 ENCOUNTER — Other Ambulatory Visit: Payer: Self-pay | Admitting: *Deleted

## 2012-08-24 MED ORDER — TRIAMTERENE-HCTZ 37.5-25 MG PO TABS: 1.0000 | ORAL_TABLET | Freq: Every day | ORAL | Status: DC

## 2012-10-26 ENCOUNTER — Telehealth: Payer: Self-pay

## 2012-10-26 NOTE — Telephone Encounter (Signed)
Prior auth required for Omeprazole; approved over phone with case # [id number] approved 06/07/12 thru 06/28/13. Spoke with Johnny Bridge at [facility].

## 2012-11-11 NOTE — Telephone Encounter (Signed)
[facility] said omeprazole will not go thru needs PA; spoke with [place] at [phone] and redid PA; approved 10/21/12 - 11/11/13 case # [id number]; [facility] said [facility]. Approval letter to follow.

## 2013-01-11 ENCOUNTER — Other Ambulatory Visit: Payer: Self-pay | Admitting: Family Medicine

## 2013-01-16 ENCOUNTER — Encounter: Payer: Self-pay | Admitting: Family Medicine

## 2013-01-16 ENCOUNTER — Ambulatory Visit (INDEPENDENT_AMBULATORY_CARE_PROVIDER_SITE_OTHER): Payer: BC Managed Care – PPO | Admitting: Family Medicine

## 2013-01-16 VITALS — BP 124/74 | HR 81 | Temp 98.3°F | Wt 328.5 lb

## 2013-01-16 DIAGNOSIS — M7711 Lateral epicondylitis, right elbow: Secondary | ICD-10-CM

## 2013-01-16 DIAGNOSIS — M25532 Pain in left wrist: Secondary | ICD-10-CM

## 2013-01-16 DIAGNOSIS — M7710 Lateral epicondylitis, unspecified elbow: Secondary | ICD-10-CM

## 2013-01-16 DIAGNOSIS — M25539 Pain in unspecified wrist: Secondary | ICD-10-CM

## 2013-01-16 NOTE — Progress Notes (Signed)
R elbow pain.  Lateral.  No help with icy hot, ibuprofen.  occ puffy.  Not red.   L wrist pain, ulnar side.  Uses his hands on machines a lot at work.  No trauma.    Both going on for about a month or longer.  No injury.    Meds, vitals, and allergies reviewed.   ROS: See HPI.  Otherwise, noncontributory.  nad R shoulder, elbow, wrist with normal rom Normal elbow inspection R elbow with pain on resisted pronation, improved with concurrent compression of the proximal forearm.  L hand and wrist with normal inspection.  Snuff box not ttp NV intact and normal ROM except for pain on resisted extension of L 4th and 5th digits (pain at the digits and the ulnar side of wrist).  No laxity.

## 2013-01-16 NOTE — Patient Instructions (Addendum)
Get a tennis elbow strap and wear it as much as possible.  Get a rigid wrist brace and wear it at work.  Take ibuprofen with food and ice your L wrist and R elbow.  This should gradually get better.

## 2013-01-17 DIAGNOSIS — M7710 Lateral epicondylitis, unspecified elbow: Secondary | ICD-10-CM | POA: Insufficient documentation

## 2013-01-17 DIAGNOSIS — M25539 Pain in unspecified wrist: Secondary | ICD-10-CM | POA: Insufficient documentation

## 2013-01-17 NOTE — Assessment & Plan Note (Signed)
Anatomy discussed, handout given with exercises.  Use ice and a strap along with GI caution on ibuprofen.  F/u prn.

## 2013-01-17 NOTE — Assessment & Plan Note (Signed)
Likely tendonitis.  Get OTC wrist brace and ice prn. F/u prn.

## 2013-02-19 ENCOUNTER — Other Ambulatory Visit: Payer: Self-pay | Admitting: Family Medicine

## 2013-03-22 ENCOUNTER — Other Ambulatory Visit: Payer: Self-pay | Admitting: Family Medicine

## 2013-03-22 NOTE — Telephone Encounter (Signed)
See drug-drug warning.  Ok to refill?

## 2013-03-23 ENCOUNTER — Other Ambulatory Visit: Payer: Self-pay | Admitting: Family Medicine

## 2013-03-23 NOTE — Telephone Encounter (Signed)
Last office visit 12/11/2011.  Last refill states needs an appointment for further refills.  No appointment scheduled.

## 2013-04-18 ENCOUNTER — Encounter (HOSPITAL_COMMUNITY): Payer: Self-pay

## 2013-04-18 ENCOUNTER — Emergency Department (HOSPITAL_COMMUNITY): Payer: Worker's Compensation

## 2013-04-18 ENCOUNTER — Emergency Department (HOSPITAL_COMMUNITY)
Admission: EM | Admit: 2013-04-18 | Discharge: 2013-04-18 | Disposition: A | Discharge status: Home / Self Care | Payer: Worker's Compensation | Attending: Emergency Medicine | Admitting: Emergency Medicine

## 2013-04-18 DIAGNOSIS — Z8709 Personal history of other diseases of the respiratory system: Secondary | ICD-10-CM | POA: Insufficient documentation

## 2013-04-18 DIAGNOSIS — I10 Essential (primary) hypertension: Secondary | ICD-10-CM | POA: Insufficient documentation

## 2013-04-18 DIAGNOSIS — E7800 Pure hypercholesterolemia, unspecified: Secondary | ICD-10-CM | POA: Insufficient documentation

## 2013-04-18 DIAGNOSIS — IMO0002 Reserved for concepts with insufficient information to code with codable children: Secondary | ICD-10-CM | POA: Insufficient documentation

## 2013-04-18 DIAGNOSIS — Y9389 Activity, other specified: Secondary | ICD-10-CM | POA: Insufficient documentation

## 2013-04-18 DIAGNOSIS — Z79899 Other long term (current) drug therapy: Secondary | ICD-10-CM | POA: Insufficient documentation

## 2013-04-18 DIAGNOSIS — Z23 Encounter for immunization: Secondary | ICD-10-CM | POA: Insufficient documentation

## 2013-04-18 DIAGNOSIS — K219 Gastro-esophageal reflux disease without esophagitis: Secondary | ICD-10-CM | POA: Insufficient documentation

## 2013-04-18 DIAGNOSIS — Y9269 Other specified industrial and construction area as the place of occurrence of the external cause: Secondary | ICD-10-CM | POA: Insufficient documentation

## 2013-04-18 DIAGNOSIS — G4733 Obstructive sleep apnea (adult) (pediatric): Secondary | ICD-10-CM | POA: Insufficient documentation

## 2013-04-18 DIAGNOSIS — Y990 Civilian activity done for income or pay: Secondary | ICD-10-CM | POA: Insufficient documentation

## 2013-04-18 DIAGNOSIS — E785 Hyperlipidemia, unspecified: Secondary | ICD-10-CM | POA: Insufficient documentation

## 2013-04-18 DIAGNOSIS — S68119A Complete traumatic metacarpophalangeal amputation of unspecified finger, initial encounter: Secondary | ICD-10-CM

## 2013-04-18 DIAGNOSIS — Z87891 Personal history of nicotine dependence: Secondary | ICD-10-CM | POA: Insufficient documentation

## 2013-04-18 DIAGNOSIS — Z872 Personal history of diseases of the skin and subcutaneous tissue: Secondary | ICD-10-CM | POA: Insufficient documentation

## 2013-04-18 DIAGNOSIS — W3189XA Contact with other specified machinery, initial encounter: Secondary | ICD-10-CM | POA: Insufficient documentation

## 2013-04-18 DIAGNOSIS — Z8619 Personal history of other infectious and parasitic diseases: Secondary | ICD-10-CM | POA: Insufficient documentation

## 2013-04-18 DIAGNOSIS — Z87448 Personal history of other diseases of urinary system: Secondary | ICD-10-CM | POA: Insufficient documentation

## 2013-04-18 MED ORDER — OXYCODONE-ACETAMINOPHEN 5-325 MG PO TABS
2.0000 | ORAL_TABLET | Freq: Once | ORAL | Status: AC
  Administered 2013-04-18: 2 via ORAL
  Filled 2013-04-18: qty 2

## 2013-04-18 MED ORDER — OXYCODONE-ACETAMINOPHEN 10-325 MG PO TABS: 1.0000 | ORAL_TABLET | ORAL | Status: DC | PRN

## 2013-04-18 MED ORDER — BUPIVACAINE HCL (PF) 0.25 % IJ SOLN
30.0000 mL | Freq: Once | INTRAMUSCULAR | Status: AC
  Administered 2013-04-18: 30 mL
  Filled 2013-04-18: qty 30

## 2013-04-18 MED ORDER — CEPHALEXIN 500 MG PO CAPS: 500.0000 mg | ORAL_CAPSULE | Freq: Four times a day (QID) | ORAL | Status: DC

## 2013-04-18 MED ORDER — TETANUS-DIPHTH-ACELL PERTUSSIS 5-2.5-18.5 LF-MCG/0.5 IM SUSP
0.5000 mL | Freq: Once | INTRAMUSCULAR | Status: AC
  Administered 2013-04-18: 0.5 mL via INTRAMUSCULAR
  Filled 2013-04-18: qty 0.5

## 2013-04-18 NOTE — ED Notes (Signed)
Dr. Hess at the bedside.  

## 2013-04-18 NOTE — ED Provider Notes (Signed)
CSN: [id number]     Arrival date & time 04/18/13  0818 History   First MD Initiated Contact with Patient 04/18/13 0820     Chief Complaint  Patient presents with  . Hand Injury   Patient is a 47 y.o. male presenting with hand injury.  Hand Injury  Pt is a 47 y/o M who presents from work due to left index finger distal amputation secondary to having the distal aspect of the left index finger compressed in a machine at work.  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  One of the pieces would not fit into the machine, and he attempted to push it in with his left index finger, however, the machine ended up compressing his finger at the same time.   Unsure of last tetanus, but has had his previous immunizations as a child.  Denies tingling, numbness in the area, just sharp pain.   Past Medical History  Diagnosis Date  . Rosacea   . Impotence of organic origin   . Essential hypertension, benign   . Esophageal reflux   . Other and unspecified hyperlipidemia   . Morbid obesity   . Obstructive sleep apnea (adult) (pediatric)   . Dermatophytosis of nail   . Other malaise and fatigue   . Routine general medical examination at a health care facility   . Other testicular hypofunction   . Acute sinusitis, unspecified   . Pure hypercholesterolemia    Past Surgical History  Procedure Laterality Date  . Back surgery  2003  . Knee surgery  2005  . Pft  2007    WNL  . Cardiolyte  2001    neg   Family History  Problem Relation Age of Onset  . Hypertension Mother   . Hypertension Father   . Heart disease Mother   . Heart disease Father   . Heart disease Sister    History  Substance Use Topics  . Smoking status: Former Smoker    Quit date: 07/21/1999  . Smokeless tobacco: Not on file  . Alcohol Use: No    Review of Systems  Constitutional: Negative.   Skin: Positive for wound.  All other systems reviewed and are  negative.    Allergies  Review of patient's allergies indicates no known allergies.  Home Medications   Current Outpatient Rx  Name  Route  Sig  Dispense  Refill  . ibuprofen (ADVIL,MOTRIN) 800 MG tablet   Oral   Take 800 mg by mouth every 8 (eight) hours as needed.          Marland Kitchen losartan (COZAAR) 100 MG tablet      TAKE 1 TABLET BY MOUTH DAILY   30 tablet   0     Needs office visit for physical before any additio ...   . omeprazole (PRILOSEC) 40 MG capsule      TAKE ONE CAPSULE BY MOUTH EVERY DAY   30 capsule   5   . simvastatin (ZOCOR) 40 MG tablet      TAKE 1 TABLET (40 MG TOTAL) BY MOUTH DAILY.   30 tablet   10   . triamterene-hydrochlorothiazide (MAXZIDE-25) 37.5-25 MG per tablet      TAKE 1 EACH (1 TABLET TOTAL) BY MOUTH DAILY.   30 tablet   0     Needs office visit for physical before any additio ...   . vardenafil (LEVITRA) 20 MG tablet  Oral   Take 5-10 mg by mouth daily as needed.            There were no vitals taken for this visit. Physical Exam  Nursing note and vitals reviewed. Constitutional: He is oriented to person, place, and time. He appears well-developed and well-nourished. No distress.  HENT:  Head: Normocephalic and atraumatic.  Eyes: Conjunctivae and EOM are normal.  Neck: Normal range of motion. Neck supple.  Cardiovascular: Normal rate and regular rhythm.   Pulmonary/Chest: Effort normal and breath sounds normal.  Musculoskeletal:  L distal 2nd phalanx with amputation to middle of the nailbed, no evidence of bone on exam.  Pt is neurovascular intact proximal to the injury with good ROM.   Neurological: He is alert and oriented to person, place, and time.  Skin: He is not diaphoretic.    ED Course  Irrigation and debridement Date/Time: 04/18/2013 10:29 AM Performed by: Briscoe Deutscher Authorized by: Lyanne Co Consent: Verbal consent obtained. Risks and benefits: risks, benefits and alternatives were discussed Consent  given by: patient Patient identity confirmed: verbally with patient Time out: Immediately prior to procedure a "time out" was called to verify the correct patient, procedure, equipment, support staff and site/side marked as required. Local anesthesia used: yes Anesthesia: digital block Local anesthetic: bupivacaine 0.25% without epinephrine Anesthetic total: 10 ml Patient sedated: no Patient tolerance: Patient tolerated the procedure well with no immediate complications.   (including critical care time) Labs Review Labs Reviewed - No data to display Imaging Review Dg Finger Index Left  04/18/2013   CLINICAL DATA:  Left index finger injury  EXAM: LEFT INDEX FINGER 2+V  COMPARISON:  None.  FINDINGS: Three views of left 2nd finger submitted. There is post traumatic partial amputation of tip of distal phalanx with associated soft tissue injury and volume loss. Open fracture highly suspected.  IMPRESSION: There is post traumatic partial amputation of tip of distal phalanx with associated soft tissue injury and volume loss. Open fracture highly suspected. .   Electronically Signed   By: Natasha Mead   On: 04/18/2013 09:09    MDM   1. Finger amputation, traumatic, initial encounter   Pt with a left 2nd distal phalanx amputation secondary to work injury.  Will get X-ray of area to find out how much of the bone is involved, give percocet for pain, Tdap due to unsure tetanus status.  Will consider starting ABx on the patient and pending review of x-ray, will determine if will close the injury or compress and allow granulation tissue to form.  10:27 AM - Xray shows some distal loss of part of the distal phalanx, as well evident after debridement.  Digital injection with 10 cc 0.25 marcaine as well as 5 cc 1% lidocaine w/ epi.  Will discuss the case with Dr. Amanda Pea, on call hand orthopaedist.    10:51 AM - Discussed case with Dr. Amanda Pea, on call orthopaedic surgeon, who recommends adaptic with xeroform  dressing, keflex, and pain medication.  Will f/u with pt tomorrow AM at 10:30.  Discussed this with the patient and he understands instructions and when to return.    Twana First Paulina Fusi, DO of [facility] 04/18/2013, 10:59 AM      Briscoe Deutscher, DO 04/18/13 1059

## 2013-04-18 NOTE — ED Provider Notes (Addendum)
I saw and evaluated the patient, reviewed the resident's note and I agree with the findings and plan.  NERVE BLOCK Performed by: Lyanne Co Consent: Verbal consent obtained. Required items: required blood products, implants, devices, and special equipment available Time out: Immediately prior to procedure a "time out" was called to verify the correct patient, procedure, equipment, support staff and site/side marked as required.  Indication: pain, amputation Nerve block body site: Digital nerves of left index finger  Preparation: Patient was prepped and draped in the usual sterile fashion. Needle gauge: 24 G Location technique: anatomical landmarks Local anesthetic: Bupivacaine 0.25% without epi  Anesthetic total: 7 ml Outcome: pain improved Patient tolerance: Patient tolerated the procedure well with no immediate complications.  Exposed bone.  Hand surgery consultation via phone.  Patient be seen in the hand surgery clinic tomorrow for likely revision.  Home on antibiotics.  Antibiotic and a dressing applied.    Irrigation and Debridement Performed by: Lyanne Co Consent: Verbal consent obtained. Risks and benefits: risks, benefits and alternatives were discussed Time out performed prior to procedure Type: finger laceration/partial ampuation Body area: left index finger Anesthesia: nerve block (see note) Complexity: simple Dressing: sterile dressing placed over wound after procedure       Lyanne Co, MD 04/18/13 1559  Lyanne Co, MD 05/01/13 [phone]

## 2013-04-18 NOTE — ED Notes (Signed)
Pt here from work with amputation of tip of left index finger. Pt states it doesn't hurt with pressure on it.

## 2013-04-19 ENCOUNTER — Other Ambulatory Visit: Payer: Self-pay | Admitting: Family Medicine

## 2013-05-17 ENCOUNTER — Other Ambulatory Visit: Payer: Self-pay | Admitting: Family Medicine

## 2013-05-17 NOTE — Telephone Encounter (Signed)
Last office visit 01/16/2013 with Dr. Para March.  Last office visit with Dr. Ermalene Searing 12/11/2011.  Ok to refill?

## 2013-05-18 NOTE — Telephone Encounter (Signed)
Todd Owens notified to call and schedule CPX with fasting labs prior.

## 2013-05-18 NOTE — Telephone Encounter (Signed)
Okay to refill once but before further refills he needs to schedule CPX with labs prior.

## 2013-05-23 ENCOUNTER — Other Ambulatory Visit (INDEPENDENT_AMBULATORY_CARE_PROVIDER_SITE_OTHER): Payer: BC Managed Care – PPO

## 2013-05-23 ENCOUNTER — Telehealth: Payer: Self-pay | Admitting: Family Medicine

## 2013-05-23 DIAGNOSIS — I10 Essential (primary) hypertension: Secondary | ICD-10-CM

## 2013-05-23 DIAGNOSIS — E785 Hyperlipidemia, unspecified: Secondary | ICD-10-CM

## 2013-05-23 DIAGNOSIS — E229 Hyperfunction of pituitary gland, unspecified: Secondary | ICD-10-CM

## 2013-05-23 DIAGNOSIS — E291 Testicular hypofunction: Secondary | ICD-10-CM

## 2013-05-23 DIAGNOSIS — N529 Male erectile dysfunction, unspecified: Secondary | ICD-10-CM

## 2013-05-23 LAB — LIPID PANEL
HDL: 33.8 mg/dL — ABNORMAL LOW (ref >39.00)
LDL Cholesterol: 114 mg/dL — ABNORMAL HIGH (ref 0–99)
Total CHOL/HDL Ratio: 5
Triglycerides: 134 mg/dL (ref 0.0–149.0)
VLDL: 26.8 mg/dL (ref 0.0–40.0)

## 2013-05-23 LAB — COMPREHENSIVE METABOLIC PANEL
ALT: 27 U/L (ref 0–53)
AST: 23 U/L (ref 0–37)
Alkaline Phosphatase: 52 U/L (ref 39–117)
BUN: 11 mg/dL (ref 6–23)
Creatinine, Ser: 0.9 mg/dL (ref 0.4–1.5)
GFR: 98.57 mL/min (ref >60.00)
Potassium: 3.8 mEq/L (ref 3.5–5.1)
Total Bilirubin: 0.6 mg/dL (ref 0.3–1.2)
Total Protein: 7.1 g/dL (ref 6.0–8.3)

## 2013-05-23 NOTE — Telephone Encounter (Signed)
Message copied by Excell Seltzer on Tue May 23, 2013  7:48 AM ------      Message from: Alvina Chou      Created: Mon May 22, 2013  5:24 PM      Regarding: Lab orders for Tuesday, 11.4.14       Patient is scheduled for CPX labs, please order future labs, Thanks , Terri       ------

## 2013-05-26 ENCOUNTER — Ambulatory Visit (INDEPENDENT_AMBULATORY_CARE_PROVIDER_SITE_OTHER): Payer: BC Managed Care – PPO | Admitting: Family Medicine

## 2013-05-26 ENCOUNTER — Encounter: Payer: Self-pay | Admitting: Family Medicine

## 2013-05-26 VITALS — BP 136/80 | HR 90 | Temp 98.2°F | Ht 70.0 in | Wt 320.2 lb

## 2013-05-26 DIAGNOSIS — Z0000 Encounter for general adult medical examination without abnormal findings: Secondary | ICD-10-CM

## 2013-05-26 DIAGNOSIS — E229 Hyperfunction of pituitary gland, unspecified: Secondary | ICD-10-CM

## 2013-05-26 DIAGNOSIS — R7309 Other abnormal glucose: Secondary | ICD-10-CM

## 2013-05-26 DIAGNOSIS — I10 Essential (primary) hypertension: Secondary | ICD-10-CM

## 2013-05-26 DIAGNOSIS — R7303 Prediabetes: Secondary | ICD-10-CM | POA: Insufficient documentation

## 2013-05-26 DIAGNOSIS — E785 Hyperlipidemia, unspecified: Secondary | ICD-10-CM

## 2013-05-26 DIAGNOSIS — Z23 Encounter for immunization: Secondary | ICD-10-CM

## 2013-05-26 DIAGNOSIS — G4733 Obstructive sleep apnea (adult) (pediatric): Secondary | ICD-10-CM

## 2013-05-26 DIAGNOSIS — E8881 Metabolic syndrome: Secondary | ICD-10-CM

## 2013-05-26 NOTE — Patient Instructions (Signed)
Get back to exercise, walking, work on low fat and low carb diet. Work on weight loss.

## 2013-05-26 NOTE — Assessment & Plan Note (Signed)
Well controlled. Continue current medication.  

## 2013-05-26 NOTE — Assessment & Plan Note (Signed)
LDL at goal , but HDL low... Get back on tack with lifestyle changes.

## 2013-05-26 NOTE — Assessment & Plan Note (Signed)
Stable on cpap

## 2013-05-26 NOTE — Assessment & Plan Note (Signed)
Stable.. No indication for repeat MRI.

## 2013-05-26 NOTE — Progress Notes (Signed)
The patient is here for annual wellness exam and preventative care.   Had partial finger amputation... Repaired by Dr. Amanda Pea.  Hypertension: Well controlled on losartan  BP Readings from Last 3 Encounters:  05/26/13 136/80  04/18/13 136/94  01/16/13 124/74  Using medication without problems or lightheadedness: None  Chest pain with exertion:None  Edema:None  Short of breath:None  Average home BPs:  Good. Other issues:   Elevated Cholesterol: Worsen control but still at goal on zocor.  Lab Results  Component Value Date   CHOL 175 05/23/2013   HDL 33.80* 05/23/2013   LDLCALC 114* 05/23/2013   TRIG 134.0 05/23/2013   CHOLHDL 5 05/23/2013    Using medications without problems:None  Muscle aches: None  Diet compliance: Moderate Exercise: None, some occ walking with yard work  Other complaints:  Wt Readings from Last 3 Encounters:  05/26/13 320 lb 4 oz (145.264 kg)  01/16/13 328 lb 8 oz (149.007 kg)  12/11/11 314 lb 1.9 oz (142.484 kg)    Hypogonadism: Saw Uro.. Could not afford testosterone supplement.   Some prolactin levels elevated, but on recheck nml, then elevated again, now stable.  MRI brain 2013: no pituitary lesion.  Review of Systems  Constitutional: Positive for fatigue. Negative for fever and unexpected weight change.  HENT: Negative for ear pain, congestion, sore throat, rhinorrhea, trouble swallowing and postnasal drip.  Eyes: Negative for pain.  Respiratory: Negative for cough, shortness of breath and wheezing.  Cardiovascular: Negative for chest pain, palpitations and leg swelling.  Gastrointestinal: Negative for nausea, abdominal pain, diarrhea, constipation and blood in stool.  Genitourinary: Negative for dysuria, urgency, hematuria, discharge, penile swelling, scrotal swelling, difficulty urinating, penile pain and testicular pain.  Skin: Negative for rash.  Neurological: Negative for syncope, weakness, light-headedness, numbness and headaches.   Psychiatric/Behavioral: Negative for behavioral problems and dysphoric mood. The patient is not nervous/anxious.  Objective:   Physical Exam  Constitutional: Vital signs are normal. He appears well-developed and well-nourished, MORBIDLY OBESE, central obesity. Non-toxic appearance. He does not appear ill. No distress.  HENT:  Head: Normocephalic and atraumatic.  Right Ear: Hearing, tympanic membrane, external ear and ear canal normal.  Left Ear: Hearing, tympanic membrane, external ear and ear canal normal.  Nose: Nose normal.  Mouth/Throat: Uvula is midline, oropharynx is clear and moist and mucous membranes are normal.  Eyes: Conjunctivae, EOM and lids are normal. Pupils are equal, round, and reactive to light. No foreign bodies found.  Neck: Trachea normal, normal range of motion and phonation normal. Neck supple. Carotid bruit is not present. No mass and no thyromegaly present.  Cardiovascular: Normal rate, regular rhythm, S1 normal, S2 normal, intact distal pulses and normal pulses. Exam reveals no gallop, no distant heart sounds and no friction rub.  No murmur heard. No peripheral edema  Pulmonary/Chest: Effort normal and breath sounds normal. No respiratory distress. He has no wheezes. He has no rhonchi. He has no rales.  Abdominal: Soft. Normal appearance and bowel sounds are normal. There is no hepatosplenomegaly. There is no tenderness. There is no rebound, no guarding and no CVA tenderness. No hernia. Hernia confirmed negative in the right inguinal area and confirmed negative in the left inguinal area.  Genitourinary: Testes normal and penis normal. Right testis shows no mass and no tenderness. Left testis shows no mass and no tenderness. Uncircumcised. No paraphimosis or penile tenderness.  Lymphadenopathy:  He has no cervical adenopathy.  Right: No inguinal adenopathy present.  Left: No inguinal adenopathy present.  Neurological: He is alert. He has normal strength and normal  reflexes. No cranial nerve deficit or sensory deficit. Gait normal.  Skin: Skin is warm, dry and intact. No rash noted.  Psychiatric: He has a normal mood and affect. His speech is normal and behavior is normal. Judgment and thought content normal.  Assessment & Plan:   The patient's preventative maintenance and recommended screening tests for an annual wellness exam were reviewed in full today.  Brought up to date unless services declined.  Counselled on the importance of diet, exercise, and its role in overall health and mortality.  The patient's FH and SH was reviewed, including their home life, tobacco status, and drug and alcohol status.   Vaccine: Td 2014, flu given. Former smoker  Prostate, colon cancer screen not indicated yet   No STD testing requested.

## 2013-05-26 NOTE — Progress Notes (Signed)
Pre-visit discussion using our clinic review tool. No additional management support is needed unless otherwise documented below in the visit note.  

## 2013-05-31 ENCOUNTER — Telehealth: Payer: Self-pay

## 2013-05-31 NOTE — Telephone Encounter (Signed)
Pt's wife called and pt does not see any rash or bumps on skin but pt is itching on arms and legs; pt's son and grandchildren have been dx with scabies. Pt's wife said she wants treatment even thou cannot see rash but is itching. Pt does not want to schedule appt and med sent to [facility].pt request cb.

## 2013-06-01 MED ORDER — PERMETHRIN 5 % EX CREA: TOPICAL_CREAM | CUTANEOUS | Status: DC

## 2013-06-01 NOTE — Telephone Encounter (Signed)
Can you call in this prescription because it it to long to send electronically.

## 2013-06-01 NOTE — Telephone Encounter (Signed)
Prescription called to [facility]  Todd Owens notified that prescription has been called to pharmacy and directions on how to use the cream was given. Patient states understanding.

## 2013-06-05 ENCOUNTER — Emergency Department (HOSPITAL_COMMUNITY)
Admission: EM | Admit: 2013-06-05 | Discharge: 2013-06-05 | Disposition: A | Discharge status: Home / Self Care | Payer: BC Managed Care – PPO | Attending: Emergency Medicine | Admitting: Emergency Medicine

## 2013-06-05 ENCOUNTER — Encounter (HOSPITAL_COMMUNITY): Payer: Self-pay | Admitting: Emergency Medicine

## 2013-06-05 DIAGNOSIS — Z872 Personal history of diseases of the skin and subcutaneous tissue: Secondary | ICD-10-CM | POA: Insufficient documentation

## 2013-06-05 DIAGNOSIS — Y92410 Unspecified street and highway as the place of occurrence of the external cause: Secondary | ICD-10-CM | POA: Insufficient documentation

## 2013-06-05 DIAGNOSIS — Z8709 Personal history of other diseases of the respiratory system: Secondary | ICD-10-CM | POA: Insufficient documentation

## 2013-06-05 DIAGNOSIS — S0990XA Unspecified injury of head, initial encounter: Secondary | ICD-10-CM | POA: Insufficient documentation

## 2013-06-05 DIAGNOSIS — Z8669 Personal history of other diseases of the nervous system and sense organs: Secondary | ICD-10-CM | POA: Insufficient documentation

## 2013-06-05 DIAGNOSIS — E7800 Pure hypercholesterolemia, unspecified: Secondary | ICD-10-CM | POA: Insufficient documentation

## 2013-06-05 DIAGNOSIS — I10 Essential (primary) hypertension: Secondary | ICD-10-CM | POA: Insufficient documentation

## 2013-06-05 DIAGNOSIS — K219 Gastro-esophageal reflux disease without esophagitis: Secondary | ICD-10-CM | POA: Insufficient documentation

## 2013-06-05 DIAGNOSIS — S0993XA Unspecified injury of face, initial encounter: Secondary | ICD-10-CM | POA: Insufficient documentation

## 2013-06-05 DIAGNOSIS — Z79899 Other long term (current) drug therapy: Secondary | ICD-10-CM | POA: Insufficient documentation

## 2013-06-05 DIAGNOSIS — E785 Hyperlipidemia, unspecified: Secondary | ICD-10-CM | POA: Insufficient documentation

## 2013-06-05 DIAGNOSIS — Z87448 Personal history of other diseases of urinary system: Secondary | ICD-10-CM | POA: Insufficient documentation

## 2013-06-05 DIAGNOSIS — Y9389 Activity, other specified: Secondary | ICD-10-CM | POA: Insufficient documentation

## 2013-06-05 DIAGNOSIS — Z87891 Personal history of nicotine dependence: Secondary | ICD-10-CM | POA: Insufficient documentation

## 2013-06-05 MED ORDER — NAPROXEN 500 MG PO TABS: 500.0000 mg | ORAL_TABLET | Freq: Two times a day (BID) | ORAL | Status: DC

## 2013-06-05 MED ORDER — METHOCARBAMOL 500 MG PO TABS: 500.0000 mg | ORAL_TABLET | Freq: Two times a day (BID) | ORAL | Status: DC

## 2013-06-05 NOTE — ED Provider Notes (Signed)
Medical screening examination/treatment/procedure(s) were performed by non-physician practitioner and as supervising physician I was immediately available for consultation/collaboration.  EKG Interpretation   None         Sharonica Kraszewski H Adine Heimann, MD 06/05/13 2253 
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
## 2013-06-06 ENCOUNTER — Ambulatory Visit: Payer: Self-pay | Admitting: Family Medicine

## 2013-06-18 ENCOUNTER — Other Ambulatory Visit: Payer: Self-pay | Admitting: Family Medicine

## 2013-06-19 ENCOUNTER — Other Ambulatory Visit: Payer: Self-pay | Admitting: Family Medicine

## 2013-06-19 NOTE — Telephone Encounter (Signed)
Last office visit 05/26/2013.  Not on current medication list.  Ok to refill?

## 2013-06-19 NOTE — Telephone Encounter (Signed)
Not on current medication list.  Refill? 

## 2013-06-20 ENCOUNTER — Other Ambulatory Visit: Payer: Self-pay | Admitting: *Deleted

## 2013-06-20 MED ORDER — TRIAMTERENE-HCTZ 37.5-25 MG PO TABS: ORAL_TABLET | ORAL | Status: DC

## 2013-06-20 NOTE — Telephone Encounter (Signed)
Called patient and confirmed he is taking the triamterene-hctz 37.5-25mg .  Advised refills for this medication and Losartan has been sent to his pharmacy.

## 2013-06-20 NOTE — Addendum Note (Signed)
Addended by: Damita Lack on: 06/20/2013 04:38 PM   Modules accepted: Orders

## 2013-06-20 NOTE — Telephone Encounter (Signed)
Call pt ... He was not taking this at last appt per my note. Is he really taking this?

## 2013-06-20 NOTE — Telephone Encounter (Signed)
Called patient and confirmed he is taking the

## 2013-11-02 ENCOUNTER — Other Ambulatory Visit: Payer: Self-pay | Admitting: Family Medicine

## 2013-12-26 ENCOUNTER — Telehealth: Payer: Self-pay | Admitting: *Deleted

## 2013-12-26 NOTE — Telephone Encounter (Signed)
Omeprazole 40 mg approved effective 12/05/2013 through the lifetime of patient's [employer] benefit.

## 2013-12-26 NOTE — Telephone Encounter (Signed)
Received fax from [place] for prior authorization on omeprazole 40 mg.  Prior Auth completed on CoverMyMeds.

## 2014-01-20 ENCOUNTER — Other Ambulatory Visit: Payer: Self-pay | Admitting: Family Medicine

## 2014-02-22 ENCOUNTER — Other Ambulatory Visit: Payer: Self-pay | Admitting: Family Medicine

## 2014-06-19 ENCOUNTER — Encounter: Payer: Self-pay | Admitting: Family Medicine

## 2014-06-19 ENCOUNTER — Ambulatory Visit (INDEPENDENT_AMBULATORY_CARE_PROVIDER_SITE_OTHER): Payer: BC Managed Care – PPO | Admitting: Family Medicine

## 2014-06-19 VITALS — BP 112/74 | HR 66 | Temp 98.5°F | Ht 70.0 in | Wt 321.5 lb

## 2014-06-19 DIAGNOSIS — B9789 Other viral agents as the cause of diseases classified elsewhere: Principal | ICD-10-CM

## 2014-06-19 DIAGNOSIS — J069 Acute upper respiratory infection, unspecified: Secondary | ICD-10-CM

## 2014-06-19 DIAGNOSIS — J029 Acute pharyngitis, unspecified: Secondary | ICD-10-CM

## 2014-06-19 LAB — POCT RAPID STREP A (OFFICE): RAPID STREP A SCREEN: NEGATIVE

## 2014-06-19 MED ORDER — GUAIFENESIN-CODEINE 100-10 MG/5ML PO SYRP: 5.0000 mL | ORAL_SOLUTION | Freq: Every evening | ORAL | Status: DC | PRN

## 2014-06-19 NOTE — Progress Notes (Signed)
Pre visit review using our clinic review tool, if applicable. No additional management support is needed unless otherwise documented below in the visit note. 

## 2014-06-19 NOTE — Addendum Note (Signed)
Addended by: Carter Kitten on: 06/19/2014 04:35 PM   Modules accepted: Orders

## 2014-06-19 NOTE — Patient Instructions (Addendum)
Schedule CPX with fasting labs prior.  Start cough suppressant at night.  Mucinex DM during the day.  Call if not improving as expected.

## 2014-06-19 NOTE — Assessment & Plan Note (Addendum)
    given exposure: rapid strep checked: neg Symptomatic care. Rest fluids. Cough suppressant at night.

## 2014-06-19 NOTE — Progress Notes (Signed)
   Subjective:    Patient ID: Todd Owens, male    DOB: 16-Dec-1965, 48 y.o.   MRN: [id number]  Cough This is a new problem. The current episode started in the past 7 days. The problem has been gradually worsening. The problem occurs every few minutes. The cough is non-productive. Associated symptoms include chest pain, chills, headaches, nasal congestion and a sore throat. Pertinent negatives include no ear congestion, ear pain, fever, myalgias, rash, rhinorrhea, shortness of breath or wheezing. The symptoms are aggravated by lying down (keeping him up at night). Risk factors: daughter in law with strep throat, non smoker. He has tried OTC cough suppressant for the symptoms. The treatment provided no relief. There is no history of asthma, bronchiectasis, bronchitis, COPD, emphysema, environmental allergies or pneumonia.      Review of Systems  Constitutional: Positive for chills. Negative for fever.  HENT: Positive for sore throat. Negative for ear pain and rhinorrhea.   Respiratory: Positive for cough. Negative for shortness of breath and wheezing.   Cardiovascular: Positive for chest pain.  Musculoskeletal: Negative for myalgias.  Skin: Negative for rash.  Allergic/Immunologic: Negative for environmental allergies.  Neurological: Positive for headaches.       Objective:   Physical Exam  Constitutional: Vital signs are normal. He appears well-developed and well-nourished.  Non-toxic appearance. He does not appear ill. No distress.  HENT:  Head: Normocephalic and atraumatic.  Right Ear: Hearing, tympanic membrane, external ear and ear canal normal. No tenderness. No foreign bodies. Tympanic membrane is not retracted and not bulging.  Left Ear: Hearing, tympanic membrane, external ear and ear canal normal. No tenderness. No foreign bodies. Tympanic membrane is not retracted and not bulging.  Nose: Nose normal. No mucosal edema or rhinorrhea. Right sinus exhibits no maxillary sinus  tenderness and no frontal sinus tenderness. Left sinus exhibits no maxillary sinus tenderness and no frontal sinus tenderness.  Mouth/Throat: Uvula is midline, oropharynx is clear and moist and mucous membranes are normal. Normal dentition. No dental caries. No oropharyngeal exudate or tonsillar abscesses.  Eyes: Conjunctivae, EOM and lids are normal. Pupils are equal, round, and reactive to light. Lids are everted and swept, no foreign bodies found.  Neck: Trachea normal, normal range of motion and phonation normal. Neck supple. Carotid bruit is not present. No thyroid mass and no thyromegaly present.  Cardiovascular: Normal rate, regular rhythm, S1 normal, S2 normal, normal heart sounds, intact distal pulses and normal pulses.  Exam reveals no gallop.   No murmur heard. Pulmonary/Chest: Effort normal and breath sounds normal. No respiratory distress. He has no wheezes. He has no rhonchi. He has no rales.  Abdominal: Soft. Normal appearance and bowel sounds are normal. There is no hepatosplenomegaly. There is no tenderness. There is no rebound, no guarding and no CVA tenderness. No hernia.  Neurological: He is alert. He has normal reflexes.  Skin: Skin is warm, dry and intact. No rash noted.  Psychiatric: He has a normal mood and affect. His speech is normal and behavior is normal. Judgment normal.          Assessment & Plan:

## 2014-07-13 ENCOUNTER — Other Ambulatory Visit: Payer: Self-pay | Admitting: Family Medicine

## 2014-08-19 ENCOUNTER — Other Ambulatory Visit: Payer: Self-pay | Admitting: Family Medicine

## 2014-08-30 ENCOUNTER — Telehealth (INDEPENDENT_AMBULATORY_CARE_PROVIDER_SITE_OTHER): Payer: BC Managed Care – PPO | Admitting: Family Medicine

## 2014-08-30 DIAGNOSIS — R7303 Prediabetes: Secondary | ICD-10-CM

## 2014-08-30 DIAGNOSIS — E291 Testicular hypofunction: Secondary | ICD-10-CM

## 2014-08-30 DIAGNOSIS — E221 Hyperprolactinemia: Secondary | ICD-10-CM

## 2014-08-30 DIAGNOSIS — Z125 Encounter for screening for malignant neoplasm of prostate: Secondary | ICD-10-CM

## 2014-08-30 DIAGNOSIS — E349 Endocrine disorder, unspecified: Secondary | ICD-10-CM

## 2014-08-30 DIAGNOSIS — E785 Hyperlipidemia, unspecified: Secondary | ICD-10-CM

## 2014-08-30 NOTE — Telephone Encounter (Signed)
-----   Message from Ellamae Sia sent at 08/28/2014 10:51 AM EST ----- Regarding: Lab orders for Friday, 2.12.16 Patient is scheduled for CPX labs, please order future labs, Thanks , Karna Christmas

## 2014-08-31 ENCOUNTER — Other Ambulatory Visit (INDEPENDENT_AMBULATORY_CARE_PROVIDER_SITE_OTHER): Payer: BC Managed Care – PPO

## 2014-08-31 DIAGNOSIS — R7309 Other abnormal glucose: Secondary | ICD-10-CM

## 2014-08-31 DIAGNOSIS — E785 Hyperlipidemia, unspecified: Secondary | ICD-10-CM

## 2014-08-31 DIAGNOSIS — R7303 Prediabetes: Secondary | ICD-10-CM

## 2014-08-31 LAB — COMPREHENSIVE METABOLIC PANEL
ALT: 22 U/L (ref 0–53)
AST: 19 U/L (ref 0–37)
Albumin: 4.1 g/dL (ref 3.5–5.2)
Alkaline Phosphatase: 57 U/L (ref 39–117)
BILIRUBIN TOTAL: 0.6 mg/dL (ref 0.2–1.2)
BUN: 16 mg/dL (ref 6–23)
CHLORIDE: 101 meq/L (ref 96–112)
CO2: 32 meq/L (ref 19–32)
CREATININE: 1.07 mg/dL (ref 0.40–1.50)
Calcium: 9.7 mg/dL (ref 8.4–10.5)
GFR: 78.24 mL/min (ref >60.00)
GLUCOSE: 100 mg/dL — AB (ref 70–99)
Potassium: 3.7 mEq/L (ref 3.5–5.1)
Sodium: 138 mEq/L (ref 135–145)
TOTAL PROTEIN: 7.3 g/dL (ref 6.0–8.3)

## 2014-08-31 LAB — LIPID PANEL
CHOLESTEROL: 141 mg/dL (ref 0–200)
HDL: 32.7 mg/dL — ABNORMAL LOW (ref >39.00)
LDL Cholesterol: 93 mg/dL (ref 0–99)
NonHDL: 108.3
Total CHOL/HDL Ratio: 4
Triglycerides: 76 mg/dL (ref 0.0–149.0)
VLDL: 15.2 mg/dL (ref 0.0–40.0)

## 2014-08-31 LAB — TESTOSTERONE: TESTOSTERONE: 284.33 ng/dL — AB (ref 300.00–890.00)

## 2014-08-31 LAB — HEMOGLOBIN A1C: HEMOGLOBIN A1C: 5.9 % (ref 4.6–6.5)

## 2014-09-01 LAB — PROLACTIN: Prolactin: 8.8 ng/mL (ref 2.1–17.1)

## 2014-09-07 ENCOUNTER — Encounter: Payer: Self-pay | Admitting: Family Medicine

## 2014-09-16 ENCOUNTER — Other Ambulatory Visit: Payer: Self-pay | Admitting: Family Medicine

## 2014-09-16 NOTE — Telephone Encounter (Signed)
Please call and reschedule CPE with Dr. Diona Browner.  He already has had his labs done.

## 2014-10-09 ENCOUNTER — Ambulatory Visit (INDEPENDENT_AMBULATORY_CARE_PROVIDER_SITE_OTHER): Payer: BC Managed Care – PPO | Admitting: Family Medicine

## 2014-10-09 ENCOUNTER — Encounter: Payer: Self-pay | Admitting: Family Medicine

## 2014-10-09 VITALS — BP 104/60 | HR 63 | Temp 97.8°F | Ht 69.5 in | Wt 314.2 lb

## 2014-10-09 DIAGNOSIS — I10 Essential (primary) hypertension: Secondary | ICD-10-CM

## 2014-10-09 DIAGNOSIS — Z0000 Encounter for general adult medical examination without abnormal findings: Secondary | ICD-10-CM

## 2014-10-09 DIAGNOSIS — R7309 Other abnormal glucose: Secondary | ICD-10-CM

## 2014-10-09 DIAGNOSIS — R7303 Prediabetes: Secondary | ICD-10-CM

## 2014-10-09 DIAGNOSIS — Z8249 Family history of ischemic heart disease and other diseases of the circulatory system: Secondary | ICD-10-CM | POA: Diagnosis not present

## 2014-10-09 DIAGNOSIS — E221 Hyperprolactinemia: Secondary | ICD-10-CM

## 2014-10-09 DIAGNOSIS — E785 Hyperlipidemia, unspecified: Secondary | ICD-10-CM

## 2014-10-09 DIAGNOSIS — L709 Acne, unspecified: Secondary | ICD-10-CM

## 2014-10-09 MED ORDER — CLINDAMYCIN PHOSPHATE 1 % EX GEL: Freq: Two times a day (BID) | CUTANEOUS | Status: DC

## 2014-10-09 NOTE — Assessment & Plan Note (Signed)
Well controlled. Continue current medication. Encouraged exercise, weight loss, healthy eating habits.  

## 2014-10-09 NOTE — Patient Instructions (Addendum)
Continue working on healthy eating, exercise and weight loss. Start walking with your wife. Decrease soda, replace with flavored water.

## 2014-10-09 NOTE — Progress Notes (Signed)
Pre visit review using our clinic review tool, if applicable. No additional management support is needed unless otherwise documented below in the visit note. 

## 2014-10-09 NOTE — Assessment & Plan Note (Signed)
Well controlled. Continue current medication.  

## 2014-10-09 NOTE — Assessment & Plan Note (Signed)
Stable

## 2014-10-09 NOTE — Progress Notes (Signed)
The patient is here for annual wellness exam and preventative care.   Father passed away with  Pneumonia.  Hypertension: Well controlled on losartan  BP Readings from Last 3 Encounters:  10/09/14 104/60  06/19/14 112/74  06/05/13 139/76  Using medication without problems or lightheadedness: None  Chest pain with exertion:None  Edema:None  Short of breath:None  Average home BPs: Good. Other issues:   Elevated Cholesterol: At goal on zocor daily . < 130. Lab Results  Component Value Date   CHOL 141 08/31/2014   HDL 32.70* 08/31/2014   LDLCALC 93 08/31/2014   TRIG 76.0 08/31/2014   CHOLHDL 4 08/31/2014  NO SE noted. Using medications without problems:None  Muscle aches: None  Diet compliance: Moderate Exercise: None Other complaints.  Wt Readings from Last 3 Encounters:  10/09/14 314 lb 4 oz (142.543 kg)  06/19/14 321 lb 8 oz (145.831 kg)  05/26/13 320 lb 4 oz (145.264 kg)    Hypogonadism: Saw Uro.. Could not afford testosterone supplement.  Some prolactin levels elevated, but on recheck nml, then elevated again, now stable. MRI brain 2013: no pituitary lesion.  Prediabetes: improved control.  Pt request refill of med for whiteheads and blackheads on nose. ? clinda gel.  Review of Systems  Constitutional: Positive for fatigue. Negative for fever and unexpected weight change.  HENT: Negative for ear pain, congestion, sore throat, rhinorrhea, trouble swallowing and postnasal drip.  Eyes: Negative for pain.  Respiratory: Negative for cough, shortness of breath and wheezing.  Cardiovascular: Negative for chest pain, palpitations and leg swelling.  Gastrointestinal: Negative for nausea, abdominal pain, diarrhea, constipation and blood in stool.  Genitourinary: Negative for dysuria, urgency, hematuria, discharge, penile swelling, scrotal swelling, difficulty urinating, penile pain and testicular pain.  Skin: Negative for rash.  Neurological: Negative  for syncope, weakness, light-headedness, numbness and headaches.  Psychiatric/Behavioral: Negative for behavioral problems and dysphoric mood. The patient is not nervous/anxious.  Objective:   Physical Exam  Constitutional: Vital signs are normal. He appears well-developed and well-nourished, MORBIDLY OBESE, central obesity. Non-toxic appearance. He does not appear ill. No distress.  HENT:  Head: Normocephalic and atraumatic.  Right Ear: Hearing, tympanic membrane, external ear and ear canal normal.  Left Ear: Hearing, tympanic membrane, external ear and ear canal normal.  Nose: Nose normal.  Mouth/Throat: Uvula is midline, oropharynx is clear and moist and mucous membranes are normal.  Eyes: Conjunctivae, EOM and lids are normal. Pupils are equal, round, and reactive to light. No foreign bodies found.  Neck: Trachea normal, normal range of motion and phonation normal. Neck supple. Carotid bruit is not present. No mass and no thyromegaly present.  Cardiovascular: Normal rate, regular rhythm, S1 normal, S2 normal, intact distal pulses and normal pulses. Exam reveals no gallop, no distant heart sounds and no friction rub.  No murmur heard. No peripheral edema  Pulmonary/Chest: Effort normal and breath sounds normal. No respiratory distress. He has no wheezes. He has no rhonchi. He has no rales.  Abdominal: Soft. Normal appearance and bowel sounds are normal. There is no hepatosplenomegaly. There is no tenderness. There is no rebound, no guarding and no CVA tenderness. No hernia. Hernia confirmed negative in the right inguinal area and confirmed negative in the left inguinal area.  Genitourinary: Testes normal and penis normal. Right testis shows no mass and no tenderness. Left testis shows no mass and no tenderness. Uncircumcised. No paraphimosis or penile tenderness.  Lymphadenopathy:  He has no cervical adenopathy.  Right: No inguinal adenopathy present.  Left: No inguinal  adenopathy present.  Neurological: He is alert. He has normal strength and normal reflexes. No cranial nerve deficit or sensory deficit. Gait normal.  Skin: Skin is warm, dry and intact. No rash noted.  Acne on nose, skin tags on right eyelid. Psychiatric: He has a normal mood and affect. His speech is normal and behavior is normal. Judgment and thought content normal.  Assessment & Plan:   The patient's preventative maintenance and recommended screening tests for an annual wellness exam were reviewed in full today.  Brought up to date unless services declined.  Counselled on the importance of diet, exercise, and its role in overall health and mortality.  The patient's FH and SH was reviewed, including their home life, tobacco status, and drug and alcohol status.   Vaccine: Td 2014, flu given. Former smoker, Quit 15-20 years ago Prostate, colon cancer screen not indicated yet  No STD testing requested. GOT:[id number]  Hep C:not indicated.

## 2014-10-09 NOTE — Assessment & Plan Note (Signed)
Resolved

## 2014-10-10 ENCOUNTER — Telehealth: Payer: Self-pay | Admitting: Family Medicine

## 2014-10-10 NOTE — Telephone Encounter (Signed)
emmi mailed  °

## 2014-10-13 ENCOUNTER — Other Ambulatory Visit: Payer: Self-pay | Admitting: Family Medicine

## 2014-10-28 ENCOUNTER — Other Ambulatory Visit: Payer: Self-pay | Admitting: Family Medicine

## 2014-10-30 ENCOUNTER — Other Ambulatory Visit: Payer: Self-pay | Admitting: Family Medicine

## 2015-01-22 ENCOUNTER — Other Ambulatory Visit: Payer: Self-pay | Admitting: Family Medicine

## 2015-06-19 ENCOUNTER — Other Ambulatory Visit: Payer: Self-pay | Admitting: Family Medicine

## 2015-10-02 ENCOUNTER — Telehealth (INDEPENDENT_AMBULATORY_CARE_PROVIDER_SITE_OTHER): Payer: BC Managed Care – PPO | Admitting: Family Medicine

## 2015-10-02 DIAGNOSIS — E349 Endocrine disorder, unspecified: Secondary | ICD-10-CM

## 2015-10-02 DIAGNOSIS — E291 Testicular hypofunction: Secondary | ICD-10-CM | POA: Diagnosis not present

## 2015-10-02 DIAGNOSIS — R7303 Prediabetes: Secondary | ICD-10-CM

## 2015-10-02 DIAGNOSIS — E221 Hyperprolactinemia: Secondary | ICD-10-CM

## 2015-10-02 DIAGNOSIS — E785 Hyperlipidemia, unspecified: Secondary | ICD-10-CM

## 2015-10-02 NOTE — Telephone Encounter (Signed)
-----   Message from Ellamae Sia sent at 09/26/2015 12:54 PM EST ----- Regarding: Lab orders for Thursday, 3.16.17 Patient is scheduled for CPX labs, please order future labs, Thanks , Karna Christmas

## 2015-10-03 ENCOUNTER — Other Ambulatory Visit (INDEPENDENT_AMBULATORY_CARE_PROVIDER_SITE_OTHER): Payer: BC Managed Care – PPO

## 2015-10-03 DIAGNOSIS — E785 Hyperlipidemia, unspecified: Secondary | ICD-10-CM

## 2015-10-03 DIAGNOSIS — R7303 Prediabetes: Secondary | ICD-10-CM

## 2015-10-03 LAB — LIPID PANEL
CHOL/HDL RATIO: 4
Cholesterol: 136 mg/dL (ref 0–200)
HDL: 32.8 mg/dL — AB (ref >39.00)
LDL CALC: 90 mg/dL (ref 0–99)
NonHDL: 103.37
Triglycerides: 65 mg/dL (ref 0.0–149.0)
VLDL: 13 mg/dL (ref 0.0–40.0)

## 2015-10-03 LAB — COMPREHENSIVE METABOLIC PANEL
ALBUMIN: 3.8 g/dL (ref 3.5–5.2)
ALT: 24 U/L (ref 0–53)
AST: 21 U/L (ref 0–37)
Alkaline Phosphatase: 52 U/L (ref 39–117)
BILIRUBIN TOTAL: 0.5 mg/dL (ref 0.2–1.2)
BUN: 15 mg/dL (ref 6–23)
CO2: 33 meq/L — AB (ref 19–32)
Calcium: 9.1 mg/dL (ref 8.4–10.5)
Chloride: 100 mEq/L (ref 96–112)
Creatinine, Ser: 1.08 mg/dL (ref 0.40–1.50)
GFR: 77.06 mL/min (ref >60.00)
GLUCOSE: 113 mg/dL — AB (ref 70–99)
POTASSIUM: 3.8 meq/L (ref 3.5–5.1)
Sodium: 139 mEq/L (ref 135–145)
TOTAL PROTEIN: 6.7 g/dL (ref 6.0–8.3)

## 2015-10-03 LAB — HEMOGLOBIN A1C: Hgb A1c MFr Bld: 6.1 % (ref 4.6–6.5)

## 2015-10-03 LAB — TESTOSTERONE: Testosterone: 334.56 ng/dL (ref 300.00–890.00)

## 2015-10-04 ENCOUNTER — Telehealth: Payer: Self-pay | Admitting: Family Medicine

## 2015-10-04 ENCOUNTER — Other Ambulatory Visit (INDEPENDENT_AMBULATORY_CARE_PROVIDER_SITE_OTHER): Payer: BC Managed Care – PPO

## 2015-10-04 ENCOUNTER — Ambulatory Visit (INDEPENDENT_AMBULATORY_CARE_PROVIDER_SITE_OTHER): Payer: BC Managed Care – PPO | Admitting: Internal Medicine

## 2015-10-04 ENCOUNTER — Encounter: Payer: Self-pay | Admitting: Internal Medicine

## 2015-10-04 VITALS — BP 130/72 | HR 87 | Temp 98.9°F | Wt 332.0 lb

## 2015-10-04 DIAGNOSIS — M5442 Lumbago with sciatica, left side: Secondary | ICD-10-CM

## 2015-10-04 DIAGNOSIS — M545 Low back pain, unspecified: Secondary | ICD-10-CM | POA: Insufficient documentation

## 2015-10-04 LAB — BASIC METABOLIC PANEL
BUN: 17 mg/dL (ref 6–23)
CALCIUM: 9.3 mg/dL (ref 8.4–10.5)
CO2: 31 mEq/L (ref 19–32)
CREATININE: 1.11 mg/dL (ref 0.40–1.50)
Chloride: 100 mEq/L (ref 96–112)
GFR: 74.66 mL/min (ref >60.00)
Glucose, Bld: 113 mg/dL — ABNORMAL HIGH (ref 70–99)
Potassium: 4 mEq/L (ref 3.5–5.1)
SODIUM: 138 meq/L (ref 135–145)

## 2015-10-04 LAB — CBC WITH DIFFERENTIAL/PLATELET
BASOS PCT: 0.6 % (ref 0.0–3.0)
Basophils Absolute: 0 10*3/uL (ref 0.0–0.1)
EOS ABS: 0.1 10*3/uL (ref 0.0–0.7)
Eosinophils Relative: 1.4 % (ref 0.0–5.0)
HCT: 43.3 % (ref 39.0–52.0)
Hemoglobin: 14.7 g/dL (ref 13.0–17.0)
LYMPHS ABS: 2.4 10*3/uL (ref 0.7–4.0)
Lymphocytes Relative: 34.4 % (ref 12.0–46.0)
MCHC: 34 g/dL (ref 30.0–36.0)
MCV: 96.8 fl (ref 78.0–100.0)
MONO ABS: 0.4 10*3/uL (ref 0.1–1.0)
Monocytes Relative: 5.9 % (ref 3.0–12.0)
NEUTROS ABS: 4 10*3/uL (ref 1.4–7.7)
NEUTROS PCT: 57.7 % (ref 43.0–77.0)
PLATELETS: 256 10*3/uL (ref 150.0–400.0)
RBC: 4.47 Mil/uL (ref 4.22–5.81)
RDW: 13.7 % (ref 11.5–15.5)
WBC: 6.9 10*3/uL (ref 4.0–10.5)

## 2015-10-04 LAB — SEDIMENTATION RATE: SED RATE: 17 mm/h (ref 0–22)

## 2015-10-04 LAB — URINALYSIS
Bilirubin Urine: NEGATIVE
Hgb urine dipstick: NEGATIVE
Ketones, ur: NEGATIVE
Leukocytes, UA: NEGATIVE
Nitrite: NEGATIVE
Specific Gravity, Urine: 1.015 (ref 1.000–1.030)
Total Protein, Urine: NEGATIVE
Urine Glucose: NEGATIVE
Urobilinogen, UA: 0.2 (ref 0.0–1.0)
pH: 6.5 (ref 5.0–8.0)

## 2015-10-04 LAB — PROLACTIN: Prolactin: 30.6 ng/mL — ABNORMAL HIGH (ref 2.0–18.0)

## 2015-10-04 MED ORDER — HYDROCODONE-ACETAMINOPHEN 5-325 MG PO TABS: 1.0000 | ORAL_TABLET | Freq: Two times a day (BID) | ORAL | Status: DC | PRN

## 2015-10-04 MED ORDER — KETOROLAC TROMETHAMINE 10 MG PO TABS: 10.0000 mg | ORAL_TABLET | Freq: Three times a day (TID) | ORAL | Status: DC | PRN

## 2015-10-04 MED ORDER — KETOROLAC TROMETHAMINE 60 MG/2ML IM SOLN
60.0000 mg | Freq: Once | INTRAMUSCULAR | Status: AC
  Administered 2015-10-04: 60 mg via INTRAMUSCULAR

## 2015-10-04 NOTE — Telephone Encounter (Signed)
Patient Name: Todd Owens DOB: 08/27/1965 Initial Comment Caller states has been having a pain in left side of back coming around to the front near belt line Nurse Assessment Nurse: Ronnald Ramp, RN, Miranda Date/Time (Eastern Time): 10/04/2015 11:42:15 AM Confirm and document reason for call. If symptomatic, describe symptoms. You must click the next button to save text entered. ---Caller states he has had pain in his left lower back off and on for 1 week. The pain is wrapping around his left side to his abdomen. Has the patient traveled out of the country within the last 30 days? ---Not Applicable Does the patient have any new or worsening symptoms? ---Yes Will a triage be completed? ---Yes Related visit to physician within the last 2 weeks? ---No Does the PT have any chronic conditions? (i.e. diabetes, asthma, etc.) ---Yes List chronic conditions. ---HTN, High Cholesterol Is this a behavioral health or substance abuse call? ---No Guidelines Guideline Title Affirmed Question Affirmed Notes Flank Pain MODERATE pain (e.g., interferes with normal activities or awakens from sleep) Final Disposition User See Physician within 24 Hours Ronnald Ramp, RN, Miranda Comments No appt available with PCP or at primary care office. Appt scheduled at [facility] for today 3/17 at 1:15 with Alex Plotnikov. Referrals GO TO FACILITY OTHER - SPECIFY Disagree/Comply: Comply

## 2015-10-04 NOTE — Patient Instructions (Signed)
Call if rash appears

## 2015-10-04 NOTE — Progress Notes (Signed)
Subjective:  Patient ID: Todd Owens, male    DOB: 09-14-1965  Age: 50 y.o. MRN: [id number]  CC: Back Pain   HPI Todd Owens presents for L LBP around L abd  X 1 week - worse w/walking - waking up at night -- 6/10. Using Ibuprofen.   Outpatient Prescriptions Prior to Visit  Medication Sig Dispense Refill  . clindamycin (CLINDAGEL) 1 % gel Apply topically 2 (two) times daily. 30 g 0  . losartan (COZAAR) 100 MG tablet TAKE 1 TABLET BY MOUTH EVERY DAY 30 tablet 3  . omeprazole (PRILOSEC) 40 MG capsule TAKE ONE CAPSULE BY MOUTH EVERY DAY 30 capsule 11  . simvastatin (ZOCOR) 40 MG tablet TAKE 1 TABLET BY MOUTH EVERY DAY 30 tablet 11  . triamterene-hydrochlorothiazide (MAXZIDE-25) 37.5-25 MG tablet TAKE 1 TABLET BY MOUTH EVERY DAY 30 tablet 3  . ibuprofen (ADVIL,MOTRIN) 200 MG tablet Take 600 mg by mouth every 6 (six) hours as needed.     No facility-administered medications prior to visit.    ROS Review of Systems  Constitutional: Negative for appetite change, fatigue and unexpected weight change.  HENT: Negative for congestion, nosebleeds, sneezing, sore throat and trouble swallowing.   Eyes: Negative for itching and visual disturbance.  Respiratory: Negative for cough.   Cardiovascular: Negative for chest pain, palpitations and leg swelling.  Gastrointestinal: Negative for nausea, diarrhea, blood in stool and abdominal distention.  Genitourinary: Negative for urgency, frequency and hematuria.  Musculoskeletal: Positive for back pain. Negative for joint swelling, gait problem and neck pain.  Skin: Negative for rash.  Neurological: Negative for dizziness, tremors, speech difficulty and weakness.  Psychiatric/Behavioral: Negative for sleep disturbance, dysphoric mood and agitation. The patient is not nervous/anxious.     Objective:  BP 130/72 mmHg  Pulse 87  Temp(Src) 98.9 F (37.2 C) (Oral)  Wt 332 lb (150.594 kg)  SpO2 97%  BP Readings from Last 3 Encounters:  10/04/15  130/72  10/09/14 104/60  06/19/14 112/74    Wt Readings from Last 3 Encounters:  10/04/15 332 lb (150.594 kg)  10/09/14 314 lb 4 oz (142.543 kg)  06/19/14 321 lb 8 oz (145.831 kg)    Physical Exam  Constitutional: He is oriented to person, place, and time. He appears well-developed. No distress.  NAD  HENT:  Mouth/Throat: Oropharynx is clear and moist.  Eyes: Conjunctivae are normal. Pupils are equal, round, and reactive to light.  Neck: Normal range of motion. No JVD present. No thyromegaly present.  Cardiovascular: Normal rate, regular rhythm, normal heart sounds and intact distal pulses.  Exam reveals no gallop and no friction rub.   No murmur heard. Pulmonary/Chest: Effort normal and breath sounds normal. No respiratory distress. He has no wheezes. He has no rales. He exhibits no tenderness.  Abdominal: Soft. Bowel sounds are normal. He exhibits no distension and no mass. There is no tenderness. There is no rebound and no guarding.  Musculoskeletal: Normal range of motion. He exhibits tenderness. He exhibits no edema.  Lymphadenopathy:    He has no cervical adenopathy.  Neurological: He is alert and oriented to person, place, and time. He has normal reflexes. No cranial nerve deficit. He exhibits normal muscle tone. He displays a negative Romberg sign. Coordination and gait normal.  Skin: Skin is warm and dry. No rash noted.  Psychiatric: He has a normal mood and affect. His behavior is normal. Judgment and thought content normal.  Obese No rash LLQ is sensitive  Lab Results  Component Value Date   WBC 6.9 10/04/2015   HGB 14.7 10/04/2015   HCT 43.3 10/04/2015   PLT 256.0 10/04/2015   GLUCOSE 113* 10/04/2015   CHOL 136 10/03/2015   TRIG 65.0 10/03/2015   HDL 32.80* 10/03/2015   LDLCALC 90 10/03/2015   ALT 24 10/03/2015   AST 21 10/03/2015   NA 138 10/04/2015   K 4.0 10/04/2015   CL 100 10/04/2015   CREATININE 1.11 10/04/2015   BUN 17 10/04/2015   CO2 31  10/04/2015   TSH 3.27 09/19/2010   HGBA1C 6.1 10/03/2015    No results found.  Assessment & Plan:   Todd Owens was seen today for back pain.  Diagnoses and all orders for this visit:  Left-sided low back pain with left-sided sciatica -     Urinalysis; Future -     CBC with Differential/Platelet; Future -     Sedimentation rate; Future -     Basic metabolic panel; Future -     ketorolac (TORADOL) injection 60 mg; Inject 2 mLs (60 mg total) into the muscle once.  Other orders -     HYDROcodone-acetaminophen (NORCO/VICODIN) 5-325 MG tablet; Take 1 tablet by mouth 2 (two) times daily as needed for moderate pain. -     ketorolac (TORADOL) 10 MG tablet; Take 1 tablet (10 mg total) by mouth every 8 (eight) hours as needed for severe pain.  I have discontinued Mr. Piacente's ibuprofen. I am also having him start on HYDROcodone-acetaminophen and ketorolac. Additionally, I am having him maintain his clindamycin, omeprazole, simvastatin, triamterene-hydrochlorothiazide, and losartan. We administered ketorolac.  Meds ordered this encounter  Medications  . HYDROcodone-acetaminophen (NORCO/VICODIN) 5-325 MG tablet    Sig: Take 1 tablet by mouth 2 (two) times daily as needed for moderate pain.    Dispense:  60 tablet    Refill:  0  . ketorolac (TORADOL) 10 MG tablet    Sig: Take 1 tablet (10 mg total) by mouth every 8 (eight) hours as needed for severe pain.    Dispense:  20 tablet    Refill:  0  . ketorolac (TORADOL) injection 60 mg    Sig:      Follow-up: Return in about 1 week (around 10/11/2015) for a follow-up visit.  Walker Kehr, MD

## 2015-10-04 NOTE — Progress Notes (Signed)
Pre visit review using our clinic review tool, if applicable. No additional management support is needed unless otherwise documented below in the visit note. 

## 2015-10-04 NOTE — Assessment & Plan Note (Signed)
3/17 ?MSK vs zoster vs kidney stone Toradol IM and po Norco prn Labs Call if rash appeared

## 2015-10-04 NOTE — Telephone Encounter (Signed)
Pt had appt 10/04/15 at 1:15 with Dr Alain Marion.

## 2015-10-10 ENCOUNTER — Encounter: Payer: Self-pay | Admitting: Family Medicine

## 2015-10-10 ENCOUNTER — Ambulatory Visit (INDEPENDENT_AMBULATORY_CARE_PROVIDER_SITE_OTHER): Payer: BC Managed Care – PPO | Admitting: Family Medicine

## 2015-10-10 VITALS — BP 120/70 | HR 83 | Temp 99.5°F | Ht 69.75 in | Wt 324.8 lb

## 2015-10-10 DIAGNOSIS — R7303 Prediabetes: Secondary | ICD-10-CM | POA: Diagnosis not present

## 2015-10-10 DIAGNOSIS — E785 Hyperlipidemia, unspecified: Secondary | ICD-10-CM

## 2015-10-10 DIAGNOSIS — R69 Illness, unspecified: Secondary | ICD-10-CM

## 2015-10-10 DIAGNOSIS — I10 Essential (primary) hypertension: Secondary | ICD-10-CM

## 2015-10-10 DIAGNOSIS — Z0000 Encounter for general adult medical examination without abnormal findings: Secondary | ICD-10-CM | POA: Diagnosis not present

## 2015-10-10 DIAGNOSIS — J069 Acute upper respiratory infection, unspecified: Secondary | ICD-10-CM | POA: Diagnosis not present

## 2015-10-10 DIAGNOSIS — J111 Influenza due to unidentified influenza virus with other respiratory manifestations: Secondary | ICD-10-CM | POA: Insufficient documentation

## 2015-10-10 DIAGNOSIS — B9789 Other viral agents as the cause of diseases classified elsewhere: Secondary | ICD-10-CM

## 2015-10-10 LAB — POCT INFLUENZA A/B
INFLUENZA A, POC: NEGATIVE
INFLUENZA B, POC: NEGATIVE

## 2015-10-10 MED ORDER — GUAIFENESIN-CODEINE 100-10 MG/5ML PO SYRP: 5.0000 mL | ORAL_SOLUTION | Freq: Every evening | ORAL | Status: DC | PRN

## 2015-10-10 NOTE — Progress Notes (Signed)
Pre visit review using our clinic review tool, if applicable. No additional management support is needed unless otherwise documented below in the visit note. 

## 2015-10-10 NOTE — Addendum Note (Signed)
Addended by: Carter Kitten on: 10/10/2015 03:32 PM   Modules accepted: Orders

## 2015-10-10 NOTE — Assessment & Plan Note (Signed)
Flu neg. Symptomatic care.

## 2015-10-10 NOTE — Assessment & Plan Note (Signed)
Well controlled on statin. Work on increasing HDL.  Restart statin if back pain not improving.

## 2015-10-10 NOTE — Patient Instructions (Addendum)
Work on low carbohydrate diet, increase exercise, weight loss.  Call if low back pain not continuing to improve.  Restart cholesterol medication when able. Cough suppressant at night.  Mucinex DM during the day.  Ibuprofen 800 mg every 8 hours for body ache.  Rest and fluids.

## 2015-10-10 NOTE — Assessment & Plan Note (Signed)
Encouraged exercise, weight loss, healthy eating habits. Info given. 

## 2015-10-10 NOTE — Progress Notes (Signed)
The patient is here for annual wellness exam and preventative care.   Wife and grandson sick with flu  Started with cough x 24 hours, chest and throat raw.  Low grade temp. No SOB. Body aches. Sudden onset.   Seen at [facility] Dr. Mamie Nick for sciatica.. Given toradol.. Pain resolved. Mild low back pain now.  No hematuria.  Hypertension: Well controlled on losartan  BP Readings from Last 3 Encounters:  10/10/15 120/70  10/04/15 130/72  10/09/14 104/60  Using medication without problems or lightheadedness: None  Chest pain with exertion:None  Edema off an on. Short of breath:None  Average home BPs: Good. Other issues:   Elevated Cholesterol: At goal on zocor daily . < 130. Lab Results  Component Value Date   CHOL 136 10/03/2015   HDL 32.80* 10/03/2015   LDLCALC 90 10/03/2015   TRIG 65.0 10/03/2015   CHOLHDL 4 10/03/2015   NO SE noted. Using medications without problems:None  Muscle aches: None   Diet compliance: Moderate Exercise: None Other complaints.  Wt Readings from Last 3 Encounters:  10/10/15 324 lb 12 oz (147.306 kg)  10/04/15 332 lb (150.594 kg)  10/09/14 314 lb 4 oz (142.543 kg)   Hypogonadism: Saw Uro.. Could not afford testosterone supplement.  Some prolactin levels elevated, but on recheck nml, then elevated again, now stable. MRI brain 2013: no pituitary lesion.  Prediabetes Poor control cbg 113  Lab Results  Component Value Date   HGBA1C 6.1 10/03/2015     Review of Systems  Constitutional: Positive for fatigue. Negative for fever and unexpected weight change.  HENT: Negative for ear pain, congestion, sore throat, rhinorrhea, trouble swallowing and postnasal drip.  Eyes: Negative for pain.  Respiratory: Negative for cough, shortness of breath and wheezing.  Cardiovascular: Negative for chest pain, palpitations and leg swelling.  Gastrointestinal: Negative for nausea, abdominal pain, diarrhea, constipation and blood in stool.   Genitourinary: Negative for dysuria, urgency, hematuria, discharge, penile swelling, scrotal swelling, difficulty urinating, penile pain and testicular pain.  Skin: Negative for rash.  Neurological: Negative for syncope, weakness, light-headedness, numbness and headaches.  Psychiatric/Behavioral: Negative for behavioral problems and dysphoric mood. The patient is not nervous/anxious.  Objective:   Physical Exam  Constitutional: Vital signs are normal. He appears well-developed and well-nourished, MORBIDLY OBESE, central obesity. Non-toxic appearance. He does not appear ill. No distress.  HENT:  Head: Normocephalic and atraumatic.  Right Ear: Hearing, tympanic membrane, external ear and ear canal normal.  Left Ear: Hearing, tympanic membrane, external ear and ear canal normal.  Nose: Nose congestion Mouth/Throat: Uvula is midline, oropharynx is slightly red and moist and mucous membranes are normal.  Eyes: Conjunctivae, EOM and lids are normal. Pupils are equal, round, and reactive to light. No foreign bodies found.  Neck: Trachea normal, normal range of motion and phonation normal. Neck supple. Carotid bruit is not present. No mass and no thyromegaly present.  Cardiovascular: Normal rate, regular rhythm, S1 normal, S2 normal, intact distal pulses and normal pulses. Exam reveals no gallop, no distant heart sounds and no friction rub.  No murmur heard. No peripheral edema  Pulmonary/Chest: Effort normal and breath sounds normal. No respiratory distress. He has no wheezes. He has no rhonchi. He has no rales.  Abdominal: Soft. Normal appearance and bowel sounds are normal. There is no hepatosplenomegaly. There is no tenderness. There is no rebound, no guarding and no CVA tenderness. No hernia. Hernia confirmed negative in the right inguinal area and confirmed negative in the  left inguinal area.  Genitourinary: Testes normal and penis normal. Right testis shows no mass and no  tenderness. Left testis shows no mass and no tenderness. Uncircumcised. No paraphimosis or penile tenderness.  Lymphadenopathy:  He has no cervical adenopathy.  Right: No inguinal adenopathy present.  Left: No inguinal adenopathy present.  Neurological: He is alert. He has normal strength and normal reflexes. No cranial nerve deficit or sensory deficit. Gait normal.  Skin: Skin is warm, dry and intact. No rash noted. Acne on nose, skin tags on right eyelid. Psychiatric: He has a normal mood and affect. His speech is normal and behavior is normal. Judgment and thought content normal.  Assessment & Plan:   The patient's preventative maintenance and recommended screening tests for an annual wellness exam were reviewed in full today.  Brought up to date unless services declined.  Counselled on the importance of diet, exercise, and its role in overall health and mortality.  The patient's FH and SH was reviewed, including their home life, tobacco status, and drug and alcohol status.   Vaccine: Td 2014. Former smoker, Quit 15-20 years ago Prostate, colon cancer screen not indicated yet  No STD testing requested. [id number] Hep C:not indicated.  No genital exam.

## 2015-10-10 NOTE — Assessment & Plan Note (Signed)
Well controlled. Continue current medication.  

## 2015-10-15 ENCOUNTER — Encounter: Payer: Self-pay | Admitting: *Deleted

## 2015-10-15 ENCOUNTER — Encounter: Payer: Self-pay | Admitting: Family Medicine

## 2015-10-15 ENCOUNTER — Ambulatory Visit (INDEPENDENT_AMBULATORY_CARE_PROVIDER_SITE_OTHER): Payer: BC Managed Care – PPO | Admitting: Family Medicine

## 2015-10-15 VITALS — BP 122/74 | HR 58 | Temp 98.1°F | Wt 319.5 lb

## 2015-10-15 DIAGNOSIS — R69 Illness, unspecified: Principal | ICD-10-CM

## 2015-10-15 DIAGNOSIS — J111 Influenza due to unidentified influenza virus with other respiratory manifestations: Secondary | ICD-10-CM

## 2015-10-15 NOTE — Assessment & Plan Note (Signed)
With sick contacts at home with flu. Outside of window for tamiflu Continue supportive care and treatment started by PCP (cheratussin and mucinex DM), add ibuprofen 600mg  TID with meals for next few days. Signs of bacterial superinfection reviewed. Lungs clear today, offered CXR, will hold off for now.

## 2015-10-15 NOTE — Progress Notes (Signed)
BP 122/74 mmHg  Pulse 58  Temp(Src) 98.1 F (36.7 C) (Oral)  Wt 319 lb 8 oz (144.924 kg)  SpO2 96%   CC: cough  Subjective:    Patient ID: Todd Owens, male    DOB: 06/13/1966, 50 y.o.   MRN: [id number]  HPI: Todd Owens is a 51 y.o. male presenting on 10/15/2015 for Cough   Started feeling ill 6 d ago. Ears feel full. Dry cough with fits that lead to dizziness. + headache and eye soreness. + spme diarrhea as well.  No tooth pain, sinus congestion. No ST, PNDrainage, nausea, abd pain.   Seen Thursday for CPE. Flu swab negative on Thursday. Feeling worse. Tmax 101.6.   He did not receive flu shot this year. No h/o asthma No smokers at home So far taking cheratussin cough syrup and mucinex DM.  Family sick with flu at home. Positive test with grandson only.   Relevant past medical, surgical, family and social history reviewed and updated as indicated. Interim medical history since our last visit reviewed. Allergies and medications reviewed and updated. Current Outpatient Prescriptions on File Prior to Visit  Medication Sig  . clindamycin (CLINDAGEL) 1 % gel Apply topically 2 (two) times daily.  Marland Kitchen guaiFENesin-codeine (ROBITUSSIN AC) 100-10 MG/5ML syrup Take 5-10 mLs by mouth at bedtime as needed for cough.  . losartan (COZAAR) 100 MG tablet TAKE 1 TABLET BY MOUTH EVERY DAY  . omeprazole (PRILOSEC) 40 MG capsule TAKE ONE CAPSULE BY MOUTH EVERY DAY  . simvastatin (ZOCOR) 40 MG tablet TAKE 1 TABLET BY MOUTH EVERY DAY  . triamterene-hydrochlorothiazide (MAXZIDE-25) 37.5-25 MG tablet TAKE 1 TABLET BY MOUTH EVERY DAY  . HYDROcodone-acetaminophen (NORCO/VICODIN) 5-325 MG tablet Take 1 tablet by mouth 2 (two) times daily as needed for moderate pain. (Patient not taking: Reported on 10/15/2015)  . ketorolac (TORADOL) 10 MG tablet Take 1 tablet (10 mg total) by mouth every 8 (eight) hours as needed for severe pain. (Patient not taking: Reported on 10/15/2015)   No current  facility-administered medications on file prior to visit.    Review of Systems Per HPI unless specifically indicated in ROS section     Objective:    BP 122/74 mmHg  Pulse 58  Temp(Src) 98.1 F (36.7 C) (Oral)  Wt 319 lb 8 oz (144.924 kg)  SpO2 96%  Wt Readings from Last 3 Encounters:  10/15/15 319 lb 8 oz (144.924 kg)  10/10/15 324 lb 12 oz (147.306 kg)  10/04/15 332 lb (150.594 kg)    Physical Exam  Constitutional: He appears well-developed and well-nourished. No distress.  HENT:  Head: Normocephalic and atraumatic.  Right Ear: Hearing, external ear and ear canal normal.  Left Ear: Hearing, external ear and ear canal normal.  Nose: Mucosal edema present. No rhinorrhea. Right sinus exhibits no maxillary sinus tenderness and no frontal sinus tenderness. Left sinus exhibits no maxillary sinus tenderness and no frontal sinus tenderness.  Mouth/Throat: Uvula is midline, oropharynx is clear and moist and mucous membranes are normal. No oropharyngeal exudate, posterior oropharyngeal edema, posterior oropharyngeal erythema or tonsillar abscesses.  Congestion behind TMs bilaterally Nasal mucosal injection  Eyes: Conjunctivae and EOM are normal. Pupils are equal, round, and reactive to light. No scleral icterus.  Neck: Normal range of motion. Neck supple.  Cardiovascular: Normal rate, regular rhythm, normal heart sounds and intact distal pulses.   No murmur heard. Pulmonary/Chest: Effort normal and breath sounds normal. No respiratory distress. He has no wheezes. He has no  rales.  Lymphadenopathy:    He has no cervical adenopathy.  Skin: Skin is warm and dry. No rash noted.  Nursing note and vitals reviewed.  Results for orders placed or performed in visit on 10/10/15  POCT Influenza A/B  Result Value Ref Range   Influenza A, POC Negative Negative   Influenza B, POC Negative Negative      Assessment & Plan:   Problem List Items Addressed This Visit    Influenza-like illness  - Primary    With sick contacts at home with flu. Outside of window for tamiflu Continue supportive care and treatment started by PCP (cheratussin and mucinex DM), add ibuprofen 600mg  TID with meals for next few days. Signs of bacterial superinfection reviewed. Lungs clear today, offered CXR, will hold off for now.           Follow up plan: Return if symptoms worsen or fail to improve.

## 2015-10-15 NOTE — Patient Instructions (Signed)
I do think you had flu like illness. Treat with continued supportive care. Continue mucinex DM during the day and codeine cough syrup at night. Add ibuprofen 600mg  three times daily with meals for next 2-3 days. Cough may take a week to go away. Watch for fever >101, worsening productive cough, wheezing or shortness of breath or sudden worsening after initial improvement - if this happens update [place] right away.

## 2015-10-15 NOTE — Progress Notes (Signed)
Pre visit review using our clinic review tool, if applicable. No additional management support is needed unless otherwise documented below in the visit note. 

## 2015-11-05 ENCOUNTER — Other Ambulatory Visit: Payer: Self-pay | Admitting: Family Medicine

## 2016-01-22 ENCOUNTER — Other Ambulatory Visit: Payer: Self-pay | Admitting: *Deleted

## 2016-01-22 MED ORDER — LOSARTAN POTASSIUM 100 MG PO TABS: 100.0000 mg | ORAL_TABLET | Freq: Every day | ORAL | Status: DC

## 2016-01-22 MED ORDER — TRIAMTERENE-HCTZ 37.5-25 MG PO TABS: 1.0000 | ORAL_TABLET | Freq: Every day | ORAL | Status: DC

## 2016-01-22 MED ORDER — SIMVASTATIN 40 MG PO TABS: 40.0000 mg | ORAL_TABLET | Freq: Every day | ORAL | Status: DC

## 2016-01-23 ENCOUNTER — Other Ambulatory Visit: Payer: Self-pay | Admitting: *Deleted

## 2016-01-23 MED ORDER — OMEPRAZOLE 40 MG PO CPDR: 40.0000 mg | DELAYED_RELEASE_CAPSULE | Freq: Every day | ORAL | Status: DC

## 2016-03-04 ENCOUNTER — Telehealth: Payer: Self-pay | Admitting: Family Medicine

## 2016-03-04 NOTE — Telephone Encounter (Signed)
Patient's wife,Evelyn,called.  Patient has a skin tag on his eyelid.  It's hanging down and interfering with his vision.  She wanted to know if Dr.Bedsole would remove the skin tag,  If not, she'd like to have him referred to someone that can remove the skin tag.

## 2016-03-05 NOTE — Telephone Encounter (Signed)
Yes, I will freeze off. Make appt.

## 2016-03-06 NOTE — Telephone Encounter (Signed)
Appointment scheduled with Dr. Diona Browner 03/17/2016 at 7:15 am.

## 2016-03-17 ENCOUNTER — Ambulatory Visit: Payer: Self-pay | Admitting: Family Medicine

## 2016-03-27 ENCOUNTER — Encounter: Payer: Self-pay | Admitting: Family Medicine

## 2016-03-27 ENCOUNTER — Ambulatory Visit (INDEPENDENT_AMBULATORY_CARE_PROVIDER_SITE_OTHER): Payer: BC Managed Care – PPO | Admitting: Family Medicine

## 2016-03-27 VITALS — BP 122/80 | HR 69 | Temp 98.8°F | Ht 69.75 in | Wt 328.8 lb

## 2016-03-27 DIAGNOSIS — G4730 Sleep apnea, unspecified: Secondary | ICD-10-CM

## 2016-03-27 DIAGNOSIS — Z23 Encounter for immunization: Secondary | ICD-10-CM

## 2016-03-27 DIAGNOSIS — L918 Other hypertrophic disorders of the skin: Secondary | ICD-10-CM | POA: Diagnosis not present

## 2016-03-27 NOTE — Progress Notes (Signed)
   Subjective:    Patient ID: Todd Owens, male    DOB: 02/06/1966, 50 y.o.   MRN: [id number]  HPI    50 year old male presents to have skin tags removed.  He has 2 on right eye.. impeeds vision, several on neck and in under arms.  No irritated, not bleeding.  no fever.   He also requests a referral  From sleep studies. He was treated in past for sleep apnea with an mouth piece but after he moved stopped using it.  He occ has apneic spells at night, is fatigued during the day.  He has not changed weight but has thick neck and morbid obesity.  Review of Systems  Constitutional: Positive for fatigue.  HENT: Negative for ear pain.   Eyes: Negative for pain.  Respiratory: Negative for shortness of breath.   Cardiovascular: Negative for chest pain.       Objective:   Physical Exam  Constitutional: Vital signs are normal. He appears well-developed and well-nourished.  HENT:  Head: Normocephalic.  Right Ear: Hearing normal.  Left Ear: Hearing normal.  Nose: Nose normal.  Mouth/Throat: Oropharynx is clear and moist and mucous membranes are normal.  Neck: Trachea normal. Carotid bruit is not present. No thyroid mass and no thyromegaly present.  Cardiovascular: Normal rate, regular rhythm and normal pulses.  Exam reveals no gallop, no distant heart sounds and no friction rub.   No murmur heard. No peripheral edema  Pulmonary/Chest: Effort normal and breath sounds normal. No respiratory distress.  Skin: Skin is warm, dry and intact. No rash noted.  Multiple skin tags.  Psychiatric: He has a normal mood and affect. His speech is normal and behavior is normal. Thought content normal.          Assessment & Plan:   Procedure Note:  3 cycles of liquid nitrogen used on 10 skin tags on neck , axillae and right eyelid. Use cone to focus liquid nitrogen as able on eyelid. No complications.

## 2016-03-27 NOTE — Patient Instructions (Signed)
Stop at front desk for referral to sleep specialist.

## 2016-03-27 NOTE — Progress Notes (Signed)
Pre visit review using our clinic review tool, if applicable. No additional management support is needed unless otherwise documented below in the visit note. 

## 2016-04-27 ENCOUNTER — Ambulatory Visit (INDEPENDENT_AMBULATORY_CARE_PROVIDER_SITE_OTHER): Payer: BC Managed Care – PPO | Admitting: Family Medicine

## 2016-04-27 ENCOUNTER — Ambulatory Visit (INDEPENDENT_AMBULATORY_CARE_PROVIDER_SITE_OTHER)
Admission: RE | Admit: 2016-04-27 | Discharge: 2016-04-27 | Disposition: A | Discharge status: Home / Self Care | Payer: BC Managed Care – PPO | Source: Ambulatory Visit | Attending: Family Medicine | Admitting: Family Medicine

## 2016-04-27 ENCOUNTER — Encounter: Payer: Self-pay | Admitting: Family Medicine

## 2016-04-27 VITALS — BP 130/80 | HR 79 | Temp 98.3°F | Wt 326.8 lb

## 2016-04-27 DIAGNOSIS — R059 Cough, unspecified: Secondary | ICD-10-CM

## 2016-04-27 DIAGNOSIS — R05 Cough: Secondary | ICD-10-CM

## 2016-04-27 DIAGNOSIS — R091 Pleurisy: Secondary | ICD-10-CM

## 2016-04-27 DIAGNOSIS — J209 Acute bronchitis, unspecified: Secondary | ICD-10-CM | POA: Insufficient documentation

## 2016-04-27 DIAGNOSIS — R079 Chest pain, unspecified: Secondary | ICD-10-CM

## 2016-04-27 MED ORDER — AZITHROMYCIN 250 MG PO TABS: ORAL_TABLET | ORAL | 0 refills | Status: DC

## 2016-04-27 MED ORDER — GUAIFENESIN-CODEINE 100-10 MG/5ML PO SYRP: 5.0000 mL | ORAL_SOLUTION | Freq: Two times a day (BID) | ORAL | 0 refills | Status: DC | PRN

## 2016-04-27 NOTE — Patient Instructions (Signed)
I think you have bronchitis leading to lung lining irritation and pleurisy (chest pain from lungs). Treat with zpack, codeine cough syrup for night time cough, ibuprofen as needed and plenty of fluids and rest. Let us know if fever >101, worsening productive cough or not improving with treatment as expected.

## 2016-04-27 NOTE — Progress Notes (Signed)
Pre visit review using our clinic review tool, if applicable. No additional management support is needed unless otherwise documented below in the visit note. 

## 2016-04-27 NOTE — Assessment & Plan Note (Signed)
Treat given duration with zpack/cheratussin. Update if persistent symptoms.

## 2016-04-27 NOTE — Assessment & Plan Note (Addendum)
Anticipate pleurisy from prolonged cough of bronchitis. Given duration of cough, treat with zpack antibiotic and cheratussin cough syrup. Update if not improving with treatment.  EKG reassuring, symptoms not consistent with cardiac pain. CXR without obvious pneumonia today.

## 2016-04-27 NOTE — Progress Notes (Signed)
BP 130/80   Pulse 79   Temp 98.3 F (36.8 C) (Oral)   Wt (!) 326 lb 12 oz (148.2 kg)   SpO2 95%   BMI 47.22 kg/m    CC: cough with chest pain Subjective:    Patient ID: Todd Owens, male    DOB: 1966/07/14, 50 y.o.   MRN: [id number]  HPI: Todd Owens is a 50 y.o. male presenting on 04/27/2016 for Cough (received flu shot 1 month ago; dry cough) and Chest Pain (started today; sharp/stabbing pain like a knife )   Patient with HTN, OSA, HLD and morbid obesity presents today with 1 mo h/o cough since he received flu shot. Initially more productive, now more recently dry. Initially ST. This morning when he woke up 6am felt stabbing pain left substernal, worse with deep breath. + coughing fits. Abdominal wall soreness and headache from coughing.   Denies fevers/chills, ear or tooth pain, abd pain, nausea, ear or tooth pain. No significant dyspnea or wheezing.  No sick contacts at home. No smokers at home. No h/o asthma.   Strong fmhx CAD.   Received flu shot this year.  No prolonged travel. Upcoming beach trip 05/08/2016.   Relevant past medical, surgical, family and social history reviewed and updated as indicated. Interim medical history since our last visit reviewed. Allergies and medications reviewed and updated. Current Outpatient Prescriptions on File Prior to Visit  Medication Sig  . clindamycin (CLINDAGEL) 1 % gel Apply topically 2 (two) times daily.  Marland Kitchen losartan (COZAAR) 100 MG tablet Take 1 tablet (100 mg total) by mouth daily.  Marland Kitchen omeprazole (PRILOSEC) 40 MG capsule Take 1 capsule (40 mg total) by mouth daily.  . simvastatin (ZOCOR) 40 MG tablet Take 1 tablet (40 mg total) by mouth daily.  Marland Kitchen triamterene-hydrochlorothiazide (MAXZIDE-25) 37.5-25 MG tablet Take 1 tablet by mouth daily.   No current facility-administered medications on file prior to visit.     Review of Systems Per HPI unless specifically indicated in ROS section     Objective:    BP 130/80    Pulse 79   Temp 98.3 F (36.8 C) (Oral)   Wt (!) 326 lb 12 oz (148.2 kg)   SpO2 95%   BMI 47.22 kg/m   Wt Readings from Last 3 Encounters:  04/27/16 (!) 326 lb 12 oz (148.2 kg)  03/27/16 (!) 328 lb 12 oz (149.1 kg)  10/15/15 (!) 319 lb 8 oz (144.9 kg)    Physical Exam  Constitutional: He appears well-developed and well-nourished. No distress.  HENT:  Head: Normocephalic and atraumatic.  Right Ear: Hearing, tympanic membrane, external ear and ear canal normal.  Left Ear: Hearing, tympanic membrane, external ear and ear canal normal.  Nose: Nose normal. No mucosal edema or rhinorrhea. Right sinus exhibits no maxillary sinus tenderness and no frontal sinus tenderness. Left sinus exhibits no maxillary sinus tenderness and no frontal sinus tenderness.  Mouth/Throat: Uvula is midline, oropharynx is clear and moist and mucous membranes are normal. No oropharyngeal exudate, posterior oropharyngeal edema, posterior oropharyngeal erythema or tonsillar abscesses.  Eyes: Conjunctivae and EOM are normal. Pupils are equal, round, and reactive to light. No scleral icterus.  Neck: Normal range of motion. Neck supple.  Cardiovascular: Normal rate, regular rhythm, normal heart sounds and intact distal pulses.   No murmur heard. Pulmonary/Chest: Effort normal and breath sounds normal. No respiratory distress. He has no wheezes. He has no rales.  Lungs clear  Lymphadenopathy:    He  has no cervical adenopathy.  Skin: Skin is warm and dry. No rash noted.  Nursing note and vitals reviewed.  EKG - NSR rate 70, normal axis, intervals, ?p mitrale, no acute ST/T changes, lead off in V5    Assessment & Plan:   Problem List Items Addressed This Visit    Acute bronchitis    Treat given duration with zpack/cheratussin. Update if persistent symptoms.      Pleurisy - Primary    Anticipate pleurisy from prolonged cough of bronchitis. Given duration of cough, treat with zpack antibiotic and cheratussin cough  syrup. Update if not improving with treatment.  EKG reassuring, symptoms not consistent with cardiac pain. CXR without obvious pneumonia today.       Relevant Orders   DG Chest 2 View    Other Visit Diagnoses    Chest pain, unspecified type       Relevant Orders   EKG 12-Lead (Completed)   Cough       Relevant Orders   DG Chest 2 View       Follow up plan: Return if symptoms worsen or fail to improve.  Ria Bush, MD

## 2016-06-03 ENCOUNTER — Ambulatory Visit (INDEPENDENT_AMBULATORY_CARE_PROVIDER_SITE_OTHER): Payer: BC Managed Care – PPO | Admitting: Pulmonary Disease

## 2016-06-03 ENCOUNTER — Encounter: Payer: Self-pay | Admitting: Pulmonary Disease

## 2016-06-03 VITALS — BP 108/74 | HR 63 | Ht 69.0 in | Wt 327.6 lb

## 2016-06-03 DIAGNOSIS — G4733 Obstructive sleep apnea (adult) (pediatric): Secondary | ICD-10-CM

## 2016-06-03 NOTE — Progress Notes (Signed)
Subjective:    Patient ID: Todd Owens, male    DOB: 1965-08-04, 50 y.o.   MRN: [id number]  HPI   This is the case of Todd Owens, 50 y.o. Male, who was referred by Jeannie Done  in consultation regarding OSA.  As you very well know, patient has a 5 pack year smoker, quit in his 31s, not known to have asthma or copd.  Patient was diagnosed with OSA in 2007. He was very symptomatic prior to CPAP. He has snoring, witnessed apneas, gasping, choking. Hypersomnia affected his functionality. He had a lab study which showed sleep apnea, unknown severity. He was placed on CPAP therapy which she uses for 6 months or so. He had a nasal mask was having a lot of issues with it. Caused him to have skin irritation as well as a lot of leak.  Current machine is not working as it is more than 50 years old. He has slowly become more symptomatic.  Patient currently snores, stops breathing, wakes up gasping and choking. Sleeps 8 hrs per night. Has hypersomnia affecting fxnality.  Very sleepy everyday. He drives a truck, has a CDL. Gets sleepy especially after lunch. Naps in pm. (-) abnormal behavior in sleep.   ESS 12.   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   Review of Systems  Constitutional: Negative.  Negative for fever and unexpected weight change.  HENT: Negative.  Negative for congestion, dental problem, ear pain, nosebleeds, postnasal drip, rhinorrhea, sinus pressure, sneezing, sore throat and trouble swallowing.   Eyes: Positive for redness and itching.  Respiratory: Negative.  Negative for cough, chest tightness, shortness of breath and wheezing.   Cardiovascular: Negative.  Negative for palpitations and leg swelling.  Gastrointestinal: Negative.  Negative for nausea and vomiting.  Endocrine: Negative.   Genitourinary: Negative.  Negative for dysuria.  Musculoskeletal: Negative.  Negative for joint swelling.  Skin: Negative.  Negative for rash.  Allergic/Immunologic:  Negative.   Neurological: Negative.  Negative for headaches.  Hematological: Negative.  Does not bruise/bleed easily.  Psychiatric/Behavioral: Negative.  Negative for dysphoric mood. The patient is not nervous/anxious.    Past Medical History:  Diagnosis Date  . Acute sinusitis, unspecified   . Dermatophytosis of nail   . Esophageal reflux   . Essential hypertension, benign   . Impotence of organic origin   . Morbid obesity ([place])   . Obstructive sleep apnea (adult) (pediatric)   . Other and unspecified hyperlipidemia   . Other malaise and fatigue   . Other testicular hypofunction   . Pure hypercholesterolemia   . Rosacea   . Routine general medical examination at a health care facility    (-) CA, DVT  Family History  Problem Relation Age of Onset  . Hypertension Mother   . Heart disease Mother   . Hypertension Father   . Stroke Father   . Heart disease Father 28  . Heart disease Sister      Past Surgical History:  Procedure Laterality Date  . BACK SURGERY  2003  . cardiolyte  2001   neg  . KNEE SURGERY  2005  . PFT  2007   WNL  . WRIST SURGERY Right     Social History   Social History  . Marital status: Married    Spouse name: N/A  . Number of children: N/A  . Years of education: N/A   Occupational History  . Not on file.   Social History  Main Topics  . Smoking status: Former Smoker    Quit date: 07/21/1999  . Smokeless tobacco: Never Used  . Alcohol use No  . Drug use: No  . Sexual activity: Yes   Other Topics Concern  . Not on file   Social History Narrative  . No narrative on file   Married with 4 children, lives near [place]. Drives a dump truck locally. (-) ETOH.   No Known Allergies   Outpatient Medications Prior to Visit  Medication Sig Dispense Refill  . clindamycin (CLINDAGEL) 1 % gel Apply topically 2 (two) times daily. 30 g 0  . losartan (COZAAR) 100 MG tablet Take 1 tablet (100 mg total) by mouth daily. 90 tablet 1  . omeprazole  (PRILOSEC) 40 MG capsule Take 1 capsule (40 mg total) by mouth daily. 90 capsule 3  . simvastatin (ZOCOR) 40 MG tablet Take 1 tablet (40 mg total) by mouth daily. 90 tablet 3  . triamterene-hydrochlorothiazide (MAXZIDE-25) 37.5-25 MG tablet Take 1 tablet by mouth daily. 90 tablet 1  . azithromycin (ZITHROMAX) 250 MG tablet Take two tablets on day one followed by one tablet on days 2-5 (Patient not taking: Reported on 06/03/2016) 6 each 0  . guaiFENesin-codeine (CHERATUSSIN AC) 100-10 MG/5ML syrup Take 5 mLs by mouth 2 (two) times daily as needed for cough (sedation precautions). (Patient not taking: Reported on 06/03/2016) 120 mL 0   No facility-administered medications prior to visit.    No orders of the defined types were placed in this encounter.       Objective:   Physical Exam  Vitals:  Vitals:   06/03/16 0850  BP: 108/74  Pulse: 63  SpO2: 96%  Weight: (!) 327 lb 9.6 oz (148.6 kg)  Height: 5\' 9"  (1.753 m)    Constitutional/General:  Pleasant, well-nourished, well-developed, not in any distress,  Comfortably seating.  Well kempt  Body mass index is 48.38 kg/m. Wt Readings from Last 3 Encounters:  06/03/16 (!) 327 lb 9.6 oz (148.6 kg)  04/27/16 (!) 326 lb 12 oz (148.2 kg)  03/27/16 (!) 328 lb 12 oz (149.1 kg)    Neck circumference: 20 inches  HEENT: Pupils equal and reactive to light and accommodation. Anicteric sclerae. Normal nasal mucosa.   No oral  lesions,  mouth clear,  oropharynx clear, no postnasal drip. (-) Oral thrush. No dental caries.  Airway - Mallampati class IV  Neck: No masses. Midline trachea. No JVD, (-) LAD. (-) bruits appreciated.  Respiratory/Chest: Grossly normal chest. (-) deformity. (-) Accessory muscle use.  Symmetric expansion. (-) Tenderness on palpation.  Resonant on percussion.  Diminished BS on both lower lung zones. (-) wheezing, crackles, rhonchi (-) egophony  Cardiovascular: Regular rate and  rhythm, heart sounds normal, no  murmur or gallops, no peripheral edema  Gastrointestinal:  Normal bowel sounds. Soft, non-tender. No hepatosplenomegaly.  (-) masses.   Musculoskeletal:  Normal muscle tone. Normal gait.   Extremities: Grossly normal. (-) clubbing, cyanosis.  (-) edema  Skin: (-) rash,lesions seen.   Neurological/Psychiatric : alert, oriented to time, place, person. Normal mood and affect          Assessment & Plan:  Obstructive sleep apnea Patient was diagnosed with OSA in 2007. He was very symptomatic prior to CPAP. He has snoring, witnessed apneas, gasping, choking. Hypersomnia affected his functionality. He had a lab study which showed sleep apnea, unknown severity. He was placed on CPAP therapy which she uses for 6 months or so. He had a  nasal mask was having a lot of issues with it. Caused him to have skin irritation as well as a lot of leak.  Current machine is not working as it is more than 50 years old. He has slowly become more symptomatic.  Patient currently snores, stops breathing, wakes up gasping and choking. Sleeps 8 hrs per night. Has hypersomnia affecting fxnality.  Very sleepy everyday. He drives a truck, has a CDL. Gets sleepy especially after lunch. Naps in pm. (-) abnormal behavior in sleep.   ESS 12.   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  Plan:  We discussed about the diagnosis of Obstructive Sleep Apnea (OSA) and implications of untreated OSA. We discussed about CPAP and BiPaP as possible treatment options.    We will schedule the patient for a sleep study. Plan for a lab study. Need to expedite. Likely will need BiPAP. Had issues with nasal mask. Wants to try nasal pillows but pressure might be too much. Will try FFM. Needs ONO on PAP once corrected. Has a CDL.    Patient was instructed to call the office if he/she has not heard back from the office 1-2 weeks after the sleep study.   Patient was instructed to call the office if he/she is having issues  with the PAP device.   We discussed good sleep hygiene.   Patient was advised not to engage in activities requiring concentration and/or vigilance if he/she is sleepy.  Patient was advised not to drive if he/she is sleepy.    MORBID OBESITY Weight loss.       Thank you very much for letting me participate in this patient's care. Please do not hesitate to give me a call if you have any questions or concerns regarding the treatment plan.   Patient will follow up with me in 6-8 weeks    J. Shirl Harris, MD 06/03/2016   9:20 AM [facility] [place] Pager: [phone] Office: [phone], Fax: [phone]

## 2016-06-03 NOTE — Patient Instructions (Signed)
It was a pleasure taking care of you today!  We will schedule you to have a sleep study to determine if you have sleep apnea.    We will get a lab sleep study.  You will be scheduled to have a lab sleep study in 4-6 weeks.  Someone from the sleep lab will call you in 2-3 days to schedule the study with you.  They usually have cancellations every night so most likely, they will have openings for a lab sleep study next week or so.  We encourage you to do your sleep study then if possible. Please give us a call in a week is no one from the sleep lab calls you in 2-3 days.   If the sleep study is positive, we will order you a CPAP  machine.  Please call the office if you do NOT receive your machine in the next 1-2 weeks.   Please make sure you use your CPAP device everytime you sleep.  We will monitor the usage of your machine per your insurance requirement.  Your insurance company may take the machine from you if you are not using it regularly.   Please clean the mask, tubings, filter, water reservoir with soapy water every week.  Please use distilled water for the water reservoir.   Please call the office or your machine provider (DME company) if you are having issues with the device.    Return to clinic in 6-8 weeks with Dr. De Dios or NP   

## 2016-06-03 NOTE — Assessment & Plan Note (Signed)
Patient was diagnosed with OSA in 2007. He was very symptomatic prior to CPAP. He has snoring, witnessed apneas, gasping, choking. Hypersomnia affected his functionality. He had a lab study which showed sleep apnea, unknown severity. He was placed on CPAP therapy which she uses for 6 months or so. He had a nasal mask was having a lot of issues with it. Caused him to have skin irritation as well as a lot of leak.  Current machine is not working as it is more than 50 years old. He has slowly become more symptomatic.  Patient currently snores, stops breathing, wakes up gasping and choking. Sleeps 8 hrs per night. Has hypersomnia affecting fxnality.  Very sleepy everyday. He drives a truck, has a CDL. Gets sleepy especially after lunch. Naps in pm. (-) abnormal behavior in sleep.   ESS 12.   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  Plan:  We discussed about the diagnosis of Obstructive Sleep Apnea (OSA) and implications of untreated OSA. We discussed about CPAP and BiPaP as possible treatment options.    We will schedule the patient for a sleep study. Plan for a lab study. Need to expedite. Likely will need BiPAP. Had issues with nasal mask. Wants to try nasal pillows but pressure might be too much. Will try FFM. Needs ONO on PAP once corrected. Has a CDL.    Patient was instructed to call the office if he/she has not heard back from the office 1-2 weeks after the sleep study.   Patient was instructed to call the office if he/she is having issues with the PAP device.   We discussed good sleep hygiene.   Patient was advised not to engage in activities requiring concentration and/or vigilance if he/she is sleepy.  Patient was advised not to drive if he/she is sleepy.

## 2016-06-03 NOTE — Assessment & Plan Note (Signed)
-   Weight loss 

## 2016-06-05 ENCOUNTER — Institutional Professional Consult (permissible substitution): Payer: Self-pay | Admitting: Pulmonary Disease

## 2016-06-10 ENCOUNTER — Ambulatory Visit (HOSPITAL_BASED_OUTPATIENT_CLINIC_OR_DEPARTMENT_OTHER): Payer: BC Managed Care – PPO | Attending: Pulmonary Disease | Admitting: Pulmonary Disease

## 2016-06-10 VITALS — Ht 69.0 in | Wt 320.0 lb

## 2016-06-10 DIAGNOSIS — G4733 Obstructive sleep apnea (adult) (pediatric): Secondary | ICD-10-CM | POA: Insufficient documentation

## 2016-06-15 ENCOUNTER — Other Ambulatory Visit (HOSPITAL_BASED_OUTPATIENT_CLINIC_OR_DEPARTMENT_OTHER): Payer: Self-pay

## 2016-06-15 DIAGNOSIS — G4733 Obstructive sleep apnea (adult) (pediatric): Secondary | ICD-10-CM

## 2016-06-18 ENCOUNTER — Telehealth: Payer: Self-pay | Admitting: Pulmonary Disease

## 2016-06-18 NOTE — Telephone Encounter (Signed)
  Please call the pt and tell the pt the [place]  showed OSA   Pt stops breathing   29 times an hour.    Please order autoCPAP 11-15 cm H2O, with a medium size Resmed Nasal mask Airfit N20 mask with chin strap.   Patient will need a 1 month download.   Patient needs to be seen by me or any of the NPs/APPs  4-6 weeks after obtaining the cpap machine. Let me know if you receive this.   Thanks!   J. Shirl Harris, MD 06/18/2016, 12:46 PM

## 2016-06-18 NOTE — Procedures (Signed)
NAME: Todd Owens DATE OF BIRTH:  1965-10-11 MEDICAL RECORD NUMBER [id number]  LOCATION: [facility]  PHYSICIAN: [place] OF STUDY: 06/10/2016  CLINICAL INFORMATION  Sleep Study Type: Split Night CPAP  Indication for sleep study: OSA  Epworth Sleepiness Score: 6   SLEEP STUDY TECHNIQUE  As per the AASM Manual for the Scoring of Sleep and Associated Events v2.3 (April 2016) with a hypopnea requiring 4% desaturations.  The channels recorded and monitored were frontal, central and occipital EEG, electrooculogram (EOG), submentalis EMG (chin), nasal and oral airflow, thoracic and abdominal wall motion, anterior tibialis EMG, snore microphone, electrocardiogram, and pulse oximetry. Continuous positive airway pressure (CPAP) was initiated when the patient met split night criteria and was titrated according to treat sleep-disordered breathing.  MEDICATIONS  Medications self-administered by patient taken the night of the study : N/A. Meds reviewed per chart review.  RESPIRATORY PARAMETERS  Diagnostic Total AHI (/hr):  29.3 RDI (/hr): 35.3 OA Index (/hr):  5.6 CA Index (/hr):  0.0  REM AHI (/hr):  20.0 NREM AHI (/hr):  29.7 Supine AHI (/hr):  N/A Non-supine AHI (/hr):  29.30  Min O2 Sat (%): 69.00 Mean O2 (%): 88.13 Time below 88% (min): 80.3    Titration Optimal Pressure (cm): 11 AHI at Optimal Pressure (/hr): 0.0 Min O2 at Optimal Pressure (%): 86.0  Supine % at Optimal (%): 0 Sleep % at Optimal (%): 100     SLEEP ARCHITECTURE  The recording time for the entire night was 390.1 minutes. During a baseline period of 203.1 minutes, the patient slept for 149.5 minutes in REM and nonREM, yielding a sleep efficiency of 73.6%. Sleep onset after lights out was 26.6 minutes with a REM latency of 89.5 minutes. The patient spent 13.38% of the night in stage N1 sleep, 82.61% in stage N2 sleep, 0.00% in stage N3 and 4.01% in REM.  During the titration period  of 184.5 minutes, the patient slept for 168.0 minutes in REM and nonREM, yielding a sleep efficiency of 91.1%. Sleep onset after CPAP initiation was 16.0 minutes with a REM latency of 13.0 minutes. The patient spent 1.49% of the night in stage N1 sleep, 64.89% in stage N2 sleep, 0.00% in stage N3 and 33.62% in REM.  CARDIAC DATA  The 2 lead EKG demonstrated sinus rhythm. The mean heart rate was 58.87 beats per minute. Other EKG findings include: None.   LEG MOVEMENT DATA  The total Periodic Limb Movements of Sleep (PLMS) were 0. The PLMS index was 0.00 .  IMPRESSIONS  1. Moderate obstructive sleep apnea occurred during the diagnostic portion of the study(AHI = 29.3/hour). An optimal PAP pressure was selected for this patient ( 11 cm of water) 2. No significant central sleep apnea occurred during the diagnostic portion of the study (CAI = 0.0/hour). 3. Moderate oxygen desaturation was noted during the diagnostic portion of the study (Min O2 =69.00%). 4. The patient snored with Loud snoring volume during the diagnostic portion of the study. 5. No cardiac abnormalities were noted during this study. 6. Clinically significant periodic limb movements did not occur during sleep.   DIAGNOSIS  Obstructive Sleep Apnea (327.23 [G47.33 ICD-10])   RECOMMENDATIONS  1. Trial of autoCPAP therapy on 11-15 cm H2O with a Medium size Resmed Nasal Mask Airfit N20 mask and heated humidification. 2. Patient will need a 1 month download to determine cpap efficacy. 3. Avoid alcohol, sedatives and other CNS depressants that  may worsen sleep apnea and disrupt normal sleep architecture. 4. Sleep hygiene should be reviewed to assess factors that may improve sleep 5. quality. 5. Weight management and regular exercise should be initiated or continued. 6. Return to the office 4-6 weeks after obtaining cpap machine.  Monica Becton, MD 06/18/2016, 9:44 AM [facility] Pager [phone] After 3 pm or if no answer, call [phone]

## 2016-06-22 NOTE — Telephone Encounter (Signed)
Was speaking to pt. But got disconnected will try to call him back later

## 2016-06-23 ENCOUNTER — Other Ambulatory Visit: Payer: Self-pay

## 2016-06-23 DIAGNOSIS — G4733 Obstructive sleep apnea (adult) (pediatric): Secondary | ICD-10-CM

## 2016-06-23 NOTE — Telephone Encounter (Signed)
Spoke with pt. And gave him AD message about his sleep study. Pt. Agreed to the order being placed. The order was placed nothing further is needed at this time. Pt. Will call once he has received his machine to schedule follow up appt.

## 2016-06-23 NOTE — Telephone Encounter (Signed)
Patient returned phone call.Erica R Taylor ° °

## 2016-07-24 ENCOUNTER — Emergency Department (HOSPITAL_COMMUNITY): Payer: BC Managed Care – PPO

## 2016-07-24 ENCOUNTER — Encounter (HOSPITAL_COMMUNITY): Payer: Self-pay

## 2016-07-24 ENCOUNTER — Telehealth: Payer: Self-pay

## 2016-07-24 ENCOUNTER — Observation Stay (HOSPITAL_COMMUNITY)
Admission: EM | Admit: 2016-07-24 | Discharge: 2016-07-25 | Disposition: A | Discharge status: Home / Self Care | Payer: BC Managed Care – PPO | Attending: Internal Medicine | Admitting: Internal Medicine

## 2016-07-24 DIAGNOSIS — R091 Pleurisy: Secondary | ICD-10-CM | POA: Insufficient documentation

## 2016-07-24 DIAGNOSIS — R42 Dizziness and giddiness: Secondary | ICD-10-CM

## 2016-07-24 DIAGNOSIS — E221 Hyperprolactinemia: Secondary | ICD-10-CM | POA: Insufficient documentation

## 2016-07-24 DIAGNOSIS — Z87891 Personal history of nicotine dependence: Secondary | ICD-10-CM | POA: Insufficient documentation

## 2016-07-24 DIAGNOSIS — L918 Other hypertrophic disorders of the skin: Secondary | ICD-10-CM | POA: Diagnosis not present

## 2016-07-24 DIAGNOSIS — Z6841 Body Mass Index (BMI) 40.0 and over, adult: Secondary | ICD-10-CM | POA: Insufficient documentation

## 2016-07-24 DIAGNOSIS — Z7982 Long term (current) use of aspirin: Secondary | ICD-10-CM | POA: Insufficient documentation

## 2016-07-24 DIAGNOSIS — E7800 Pure hypercholesterolemia, unspecified: Secondary | ICD-10-CM | POA: Diagnosis not present

## 2016-07-24 DIAGNOSIS — J209 Acute bronchitis, unspecified: Secondary | ICD-10-CM | POA: Diagnosis not present

## 2016-07-24 DIAGNOSIS — G4733 Obstructive sleep apnea (adult) (pediatric): Secondary | ICD-10-CM | POA: Diagnosis not present

## 2016-07-24 DIAGNOSIS — R55 Syncope and collapse: Secondary | ICD-10-CM | POA: Diagnosis present

## 2016-07-24 DIAGNOSIS — Z8249 Family history of ischemic heart disease and other diseases of the circulatory system: Secondary | ICD-10-CM | POA: Diagnosis not present

## 2016-07-24 DIAGNOSIS — F488 Other specified nonpsychotic mental disorders: Secondary | ICD-10-CM

## 2016-07-24 DIAGNOSIS — Z823 Family history of stroke: Secondary | ICD-10-CM | POA: Diagnosis not present

## 2016-07-24 DIAGNOSIS — R0789 Other chest pain: Secondary | ICD-10-CM | POA: Diagnosis not present

## 2016-07-24 DIAGNOSIS — I10 Essential (primary) hypertension: Secondary | ICD-10-CM

## 2016-07-24 DIAGNOSIS — E8881 Metabolic syndrome: Secondary | ICD-10-CM | POA: Insufficient documentation

## 2016-07-24 DIAGNOSIS — K219 Gastro-esophageal reflux disease without esophagitis: Secondary | ICD-10-CM | POA: Diagnosis not present

## 2016-07-24 DIAGNOSIS — L719 Rosacea, unspecified: Secondary | ICD-10-CM | POA: Diagnosis not present

## 2016-07-24 DIAGNOSIS — Z79899 Other long term (current) drug therapy: Secondary | ICD-10-CM | POA: Diagnosis not present

## 2016-07-24 DIAGNOSIS — E782 Mixed hyperlipidemia: Secondary | ICD-10-CM | POA: Diagnosis present

## 2016-07-24 DIAGNOSIS — E785 Hyperlipidemia, unspecified: Secondary | ICD-10-CM

## 2016-07-24 DIAGNOSIS — B351 Tinea unguium: Secondary | ICD-10-CM | POA: Insufficient documentation

## 2016-07-24 LAB — BASIC METABOLIC PANEL
ANION GAP: 11 (ref 5–15)
BUN: 9 mg/dL (ref 6–20)
CHLORIDE: 99 mmol/L — AB (ref 101–111)
CO2: 26 mmol/L (ref 22–32)
Calcium: 9.3 mg/dL (ref 8.9–10.3)
Creatinine, Ser: 0.97 mg/dL (ref 0.61–1.24)
GFR calc Af Amer: >60 mL/min (ref >60)
Glucose, Bld: 118 mg/dL — ABNORMAL HIGH (ref 65–99)
POTASSIUM: 3.7 mmol/L (ref 3.5–5.1)
Sodium: 136 mmol/L (ref 135–145)

## 2016-07-24 LAB — CBC
HEMATOCRIT: 40.7 % (ref 39.0–52.0)
HEMOGLOBIN: 14.7 g/dL (ref 13.0–17.0)
MCH: 33.7 pg (ref 26.0–34.0)
MCHC: 36.1 g/dL — AB (ref 30.0–36.0)
MCV: 93.3 fL (ref 78.0–100.0)
Platelets: 256 10*3/uL (ref 150–400)
RBC: 4.36 MIL/uL (ref 4.22–5.81)
RDW: 12.6 % (ref 11.5–15.5)
WBC: 6.8 10*3/uL (ref 4.0–10.5)

## 2016-07-24 LAB — I-STAT TROPONIN, ED: Troponin i, poc: 0.01 ng/mL (ref 0.00–0.08)

## 2016-07-24 NOTE — ED Triage Notes (Addendum)
Pt reports near syncopal episode today around 12pm and reports he became very lightheaded. No LOC. He reports not eating a lot today. Pt A&Ox4. Pt also reports central chest discomfort.

## 2016-07-24 NOTE — ED Notes (Signed)
Pt placed on hospital bed at this time.  

## 2016-07-24 NOTE — Telephone Encounter (Signed)
Patient's wife called very concerned about patient.  He was visiting a friend at [facility] when he became weak and experienced a near syncopal episode.  Wife reports that he became ashy and began jerking all over and nearly collapsed.  Nursing staff assessed his vitals (VITALS WNL except HR 52, FSBS 98, and EKG obtained.   When asked by this writer what they were instructed to do by the assessing nursing team, she states, " they wouldn't tell us anything because they said he wasn't their patient".  Given event, location and medication/family hx, I strongly recommended she take him over to the ER for further evaluation and work up.  She was very hesitant and wanted to hear from Dr. Diona Browner before taking him.    Discussed patient condition with Dr. Diona Browner, she supported the decision to be evaluated by ER and information given to patient's wife.  She verbalizes understanding and agreement with plan.

## 2016-07-24 NOTE — ED Notes (Addendum)
Pt given sprite and snack pack; pt instructed that he is NPO after midnight

## 2016-07-24 NOTE — H&P (Signed)
Todd Owens [id number] DOB: [birth year] DOA: 07/24/2016     PCP: Eliezer Lofts, MD   Outpatient Specialists: none  Patient coming from:    home Lives  With family    Chief Complaint: Syncope  HPI: Todd Owens is a 51 y.o. male with medical history significant of  HTN, OSA, HLD and morbid obesity early family history of coronary artery disease with cardiac death    Presented with near syncope episodes while visiting a friend in [place]. Wife states he became weak and almost passed out he appeared ashy and had some tremors. This occurred while he was sitting down in a chair denies complete syncope had some tingling of his hands prior to this he denies any recent illness no nausea,  Vomiting. He has not eaten anything all day and was bothered by the smells of the hospital. He was able to visualize his friends surgical repair. Denies he was bothered by it. 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  Vitals were assessed his heart rate was 52 at sugar 98 EKG was checked He haven't had any fevers or chills no headache no chest pain or shortness of breath associated with this.   He called his PCP who recommended elevation at the emergency department Regarding pertinent Chronic problems: Blood pressure controlled with losartan and Maxzide Patient reports he have had a normal stress test 5 years ago. Hx of CPAP but has not had CPAP sit up yet  IN ER:  Temp (24hrs), Avg:98.2 F (36.8 C), Min:98.1 F (36.7 C), Max:98.3 F (36.8 C)  RR 15 95% HR 60 BP 106/50 Na 136 K 3.7 Cr 0.97 WBC 6.8 Hg 14.7  CXR -negative Following Medications were ordered in ER:      Hospitalist was called for admission for syncope without prodrome  Review of Systems:    Pertinent positives include: presyncope , fatigue,  Constitutional:  No weight loss, night sweats, Fevers, chills weight loss  HEENT:  No headaches, Difficulty  swallowing,Tooth/dental problems,Sore throat,  No sneezing, itching, ear ache, nasal congestion, post nasal drip,  Cardio-vascular:  No chest pain, Orthopnea, PND, anasarca, dizziness, palpitations.no Bilateral lower extremity swelling  GI:  No heartburn, indigestion, abdominal pain, nausea, vomiting, diarrhea, change in bowel habits, loss of appetite, melena, blood in stool, hematemesis Resp:  no shortness of breath at rest. No dyspnea on exertion, No excess mucus, no productive cough, No non-productive cough, No coughing up of blood.No change in color of mucus.No wheezing. Skin:  no rash or lesions. No jaundice GU:  no dysuria, change in color of urine, no urgency or frequency. No straining to urinate.  No flank pain.  Musculoskeletal:  No joint pain or no joint swelling. No decreased range of motion. No back pain.  Psych:  No change in mood or affect. No depression or anxiety. No memory loss.  Neuro: no localizing neurological complaints, no tingling, no weakness, no double vision, no gait abnormality, no slurred speech, no confusion  As per HPI otherwise 10 point review of systems negative.   Past Medical History: Past Medical History:  Diagnosis Date  . Acute sinusitis, unspecified   . Dermatophytosis of nail   . Esophageal reflux   . Essential hypertension, benign   . Impotence of organic origin   . Morbid obesity ([place])   . Obstructive sleep apnea (adult) (pediatric)   . Other and unspecified hyperlipidemia   . Other malaise and  fatigue   . Other testicular hypofunction   . Pure hypercholesterolemia   . Rosacea   . Routine general medical examination at a health care facility    Past Surgical History:  Procedure Laterality Date  . BACK SURGERY  2003  . cardiolyte  2001   neg  . KNEE SURGERY  2005  . PFT  2007   WNL  . WRIST SURGERY Right      Social History:  Ambulatory   independently      reports that he quit smoking about 17 years ago. He has never used  smokeless tobacco. He reports that he does not drink alcohol or use drugs.  Allergies:  No Known Allergies     Family History:   Family History  Problem Relation Age of Onset  . Hypertension Mother   . Heart disease Mother   . Hypertension Father   . Stroke Father   . Heart disease Father 65  . Heart disease Sister     Medications: Prior to Admission medications   Medication Sig Start Date End Date Taking? Authorizing Provider  clindamycin (CLINDAGEL) 1 % gel Apply topically 2 (two) times daily. Patient taking differently: Apply 1 application topically 2 (two) times daily as needed (adult acne on nose).  10/09/14  Yes Amy Cletis Athens, MD  ibuprofen (ADVIL,MOTRIN) 200 MG tablet Take 600-800 mg by mouth every 6 (six) hours as needed (back pain).   Yes Historical Provider, MD  losartan (COZAAR) 100 MG tablet Take 1 tablet (100 mg total) by mouth daily. 01/22/16  Yes Amy Cletis [place], MD  omeprazole (PRILOSEC) 40 MG capsule Take 1 capsule (40 mg total) by mouth daily. 01/23/16  Yes Amy Cletis Athens, MD  simvastatin (ZOCOR) 40 MG tablet Take 1 tablet (40 mg total) by mouth daily. 01/22/16  Yes Amy Cletis Athens, MD  triamterene-hydrochlorothiazide (MAXZIDE-25) 37.5-25 MG tablet Take 1 tablet by mouth daily. 01/22/16  Yes Amy Cletis Athens, MD  PRESCRIPTION MEDICATION Inhale into the lungs at bedtime. CPAP    Historical Provider, MD    Physical Exam: Patient Vitals for the past 24 hrs:  BP Temp Temp src Pulse Resp SpO2 Height Weight  07/24/16 2315 (!) 106/50 - - 60 15 95 % - -  07/24/16 2235 118/61 - - 62 18 98 % - -  07/24/16 2200 112/60 - - 72 - 92 % - -  07/24/16 2000 123/65 - - 66 14 96 % - -  07/24/16 1754 111/66 - - 60 18 96 % - -  07/24/16 1535 110/72 98.1 F (36.7 C) Oral 68 18 100 % - -  07/24/16 1532 - - - - - - 5\' 9"  (1.753 m) (!) 145.2 kg (320 lb)  07/24/16 1530 137/60 98.3 F (36.8 C) Oral (!) 58 18 98 % - -    1. General:  in No Acute distress 2. Psychological: Alert and  Oriented 3.  Head/ENT:    Dry Mucous Membranes                          Head Non traumatic, neck supple                           normal Dentition 4. SKIN:  decreased Skin turgor,  Skin clean Dry and intact no rash 5. Heart: Regular rate and rhythm no Murmur, Rub or gallop 6. Lungs:  Clear to auscultation bilaterally, no  wheezes or crackles but  distant  7. Abdomen: Soft,  non-tender, Non distended, obese 8. Lower extremities: no clubbing, cyanosis, or edema 9. Neurologically Grossly intact, moving all 4 extremities equally 10.  MSK: Normal range of motion   body mass index is 47.26 kg/m.  Labs on Admission:   Labs on Admission: I have personally reviewed following labs and imaging studies  CBC:  Recent Labs Lab 07/24/16 1530  WBC 6.8  HGB 14.7  HCT 40.7  MCV 93.3  PLT 123456   Basic Metabolic Panel:  Recent Labs Lab 07/24/16 1530  NA 136  K 3.7  CL 99*  CO2 26  GLUCOSE 118*  BUN 9  CREATININE 0.97  CALCIUM 9.3   GFR: Estimated Creatinine Clearance: 129.5 mL/min (by C-G formula based on SCr of 0.97 mg/dL). Liver Function Tests: No results for input(s): AST, ALT, ALKPHOS, BILITOT, PROT, ALBUMIN in the last 168 hours. No results for input(s): LIPASE, AMYLASE in the last 168 hours. No results for input(s): AMMONIA in the last 168 hours. Coagulation Profile: No results for input(s): INR, PROTIME in the last 168 hours. Cardiac Enzymes: No results for input(s): CKTOTAL, CKMB, CKMBINDEX, TROPONINI in the last 168 hours. BNP (last 3 results) No results for input(s): PROBNP in the last 8760 hours. HbA1C: No results for input(s): HGBA1C in the last 72 hours. CBG: No results for input(s): GLUCAP in the last 168 hours. Lipid Profile: No results for input(s): CHOL, HDL, LDLCALC, TRIG, CHOLHDL, LDLDIRECT in the last 72 hours. Thyroid Function Tests: No results for input(s): TSH, T4TOTAL, FREET4, T3FREE, THYROIDAB in the last 72 hours. Anemia Panel: No results for input(s):  VITAMINB12, FOLATE, FERRITIN, TIBC, IRON, RETICCTPCT in the last 72 hours.  Sepsis Labs: @LABRCNTIP (procalcitonin:4,lacticidven:4) )No results found for this or any previous visit (from the past 240 hour(s)).    UA  not ordered  Lab Results  Component Value Date   HGBA1C 6.1 10/03/2015    Estimated Creatinine Clearance: 129.5 mL/min (by C-G formula based on SCr of 0.97 mg/dL).  BNP (last 3 results) No results for input(s): PROBNP in the last 8760 hours.   ECG REPORT  Independently reviewed Rate: 62  Rhythm: Normal sinus rhythm ST&T Change: No acute ischemic changes  [id number]  Filed Weights   07/24/16 1532  Weight: (!) 145.2 kg (320 lb)     Cultures: No results found for: SDES, [place], CULT, REPTSTATUS   Radiological Exams on Admission: Dg Chest 2 View  Result Date: 07/24/2016 CLINICAL DATA:  Chest pain EXAM: CHEST  2 VIEW COMPARISON:  04/27/2016 CXR FINDINGS: The heart size and mediastinal contours are within normal limits. Both lungs are clear. Stable pulmonary scarring in the right upper lobe. The visualized skeletal structures are unremarkable. IMPRESSION: No active cardiopulmonary disease. Electronically Signed   By: Ashley Royalty M.D.   On: 07/24/2016 16:25    Chart has been reviewed    Assessment/Plan   51 y.o. male with medical history significant of  HTN, OSA, HLD and morbid obesity early family history of coronary artery disease with cardiac death Admitted with pre-syncope  Present on Admission: . Syncope -  given risk factor will admit rehydrate obtain CE, monitor on tele and obtain echo likely secondary to stressful events no true syncope mobile presyncopal events.  . Essential hypertension, benign given soft blood pressure will hold home medications for today . Hyperlipidemia continue home medications will need to follow-up as an outpatient . Obstructive sleep apnea - Hold off on CPap patient  never been on it yet  need to have set up as an outpatient  patient's preference    Other plan as per orders.  DVT prophylaxis:    Lovenox     Code Status:  FULL CODE   as per patient    Family Communication:   Family not  at  Bedside     Disposition Plan:   To home once workup is complete and patient is stable                              Consults called: none  Admission status:   obs   Level of care    tele       I have spent a total of 57 min on this admission   Rasmus Preusser 07/25/2016, 1:08 AM    [facility]  Pager [phone]   after 2 AM please page floor coverage PA If 7AM-7PM, please contact the day team taking care of the patient  [email or web address]  Password [email or web address]

## 2016-07-24 NOTE — ED Provider Notes (Signed)
[facility] DEPT Provider Note   CSN: [id number] Arrival date & time: 07/24/16  1430     History   Chief Complaint Chief Complaint  Patient presents with  . Near Syncope    HPI Todd Owens is a 51 y.o. male with a hx of HTN, GERD presents to the Emergency Department complaining acute onset syncope around noon today while sitting in a chair visiting a friend.  Pt reports he had associated tingling in his hands after the incident.  CBG after the incident was 98, BP 126/68.  No recent illness, vertigo, nausea, vomiting.  Pt reports normal stress test 5 years ago. Family cardiac hx.  He has been taking his medications as prescribed.  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  No recurrent symptoms.  Pt reports no aura prior.  Pt denies headache, Vision changes, neck pain, chest pain, shortness of breath, abdominal pain, vomiting, diarrhea, weakness, dizziness.  Pt is a former smoker.    PCP: [facility]: none   The history is provided by the patient and medical records. No language interpreter was used.    Past Medical History:  Diagnosis Date  . Acute sinusitis, unspecified   . Dermatophytosis of nail   . Esophageal reflux   . Essential hypertension, benign   . Impotence of organic origin   . Morbid obesity ([place])   . Obstructive sleep apnea (adult) (pediatric)   . Other and unspecified hyperlipidemia   . Other malaise and fatigue   . Other testicular hypofunction   . Pure hypercholesterolemia   . Rosacea   . Routine general medical examination at a health care facility     Patient Active Problem List   Diagnosis Date Noted  . Syncope 07/24/2016  . Pleurisy 04/27/2016  . Acute bronchitis 04/27/2016  . Cutaneous skin tags 03/27/2016  . Left LBP 10/04/2015  . Family history of premature CAD 10/09/2014  . Prediabetes 05/26/2013  . Metabolic syndrome [id number]  . Abnormal stress test 03/20/2011  . Hyperprolactinemia ([place])  09/26/2010  . Testosterone deficiency 09/17/2009  . 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 ONYCHOMYCOSIS, BILATERAL 09/14/2008  . GERD 09/14/2008  . Obstructive sleep apnea 11/25/2007  . Essential hypertension, benign 11/14/2007  . MORBID OBESITY 10/07/2007  . ACNE, ROSACEA 10/07/2007  . Hyperlipidemia 10/03/2007    Past Surgical History:  Procedure Laterality Date  . BACK SURGERY  2003  . cardiolyte  2001   neg  . KNEE SURGERY  2005  . PFT  2007   WNL  . WRIST SURGERY Right        Home Medications    Prior to Admission medications   Medication Sig Start Date End Date Taking? Authorizing Provider  clindamycin (CLINDAGEL) 1 % gel Apply topically 2 (two) times daily. Patient taking differently: Apply 1 application topically 2 (two) times daily as needed (adult acne on nose).  10/09/14  Yes Amy Cletis Athens, MD  ibuprofen (ADVIL,MOTRIN) 200 MG tablet Take 600-800 mg by mouth every 6 (six) hours as needed (back pain).   Yes Historical Provider, MD  losartan (COZAAR) 100 MG tablet Take 1 tablet (100 mg total) by mouth daily. 01/22/16  Yes Amy Cletis Athens, MD  omeprazole (PRILOSEC) 40 MG capsule Take 1 capsule (40 mg total) by mouth daily. 01/23/16  Yes Amy Cletis Athens, MD  simvastatin (ZOCOR) 40 MG tablet Take 1 tablet (40 mg total) by mouth daily. 01/22/16  Yes Amy Cletis Athens, MD  triamterene-hydrochlorothiazide (MAXZIDE-25)  37.5-25 MG tablet Take 1 tablet by mouth daily. 01/22/16  Yes Amy Cletis Athens, MD  PRESCRIPTION MEDICATION Inhale into the lungs at bedtime. CPAP    Historical Provider, MD    Family History Family History  Problem Relation Age of Onset  . Hypertension Mother   . Heart disease Mother   . Hypertension Father   . Stroke Father   . Heart disease Father 68  . Heart disease Sister     Social History Social History  Substance Use Topics  . Smoking status: Former Smoker    Quit date: 07/21/1999  . Smokeless tobacco: Never Used  . Alcohol use No     Allergies     Patient has no known allergies.   Review of Systems Review of Systems  Cardiovascular: Positive for near-syncope.  Neurological: Positive for syncope.  All other systems reviewed and are negative.    Physical Exam Updated Vital Signs BP 112/60   Pulse 72   Temp 98.1 F (36.7 C) (Oral)   Resp 14   Ht 5\' 9"  (1.753 m)   Wt (!) 145.2 kg   SpO2 92%   BMI 47.26 kg/m   Physical Exam  Constitutional: He is oriented to person, place, and time. He appears well-developed and well-nourished. No distress.  HENT:  Head: Normocephalic and atraumatic.  Mouth/Throat: Oropharynx is clear and moist.  Eyes: Conjunctivae and EOM are normal. Pupils are equal, round, and reactive to light. No scleral icterus.  No horizontal, vertical or rotational nystagmus  Neck: Normal range of motion. Neck supple.  Full active and passive ROM without pain No midline or paraspinal tenderness No nuchal rigidity or meningeal signs  Cardiovascular: Normal rate, regular rhythm and intact distal pulses.   Pulmonary/Chest: Effort normal and breath sounds normal. No respiratory distress. He has no wheezes. He has no rales.  Abdominal: Soft. Bowel sounds are normal. There is no tenderness. There is no rebound and no guarding.  Musculoskeletal: Normal range of motion.  Lymphadenopathy:    He has no cervical adenopathy.  Neurological: He is alert and oriented to person, place, and time. No cranial nerve deficit. He exhibits normal muscle tone. Coordination normal.  Mental Status:  Alert, oriented, thought content appropriate. Speech fluent without evidence of aphasia. Able to follow 2 step commands without difficulty.  Cranial Nerves:  II:  Peripheral visual fields grossly normal, pupils equal, round, reactive to light III,IV, VI: ptosis not present, extra-ocular motions intact bilaterally  V,VII: smile symmetric, facial light touch sensation equal VIII: hearing grossly normal bilaterally  IX,X: midline uvula  rise  XI: bilateral shoulder shrug equal and strong XII: midline tongue extension  Motor:  5/5 in upper and lower extremities bilaterally including strong and equal grip strength and dorsiflexion/plantar flexion Sensory: Pinprick and light touch normal in all extremities.  Cerebellar: normal finger-to-nose with bilateral upper extremities Gait: normal gait and balance CV: distal pulses palpable throughout   Skin: Skin is warm and dry. No rash noted. He is not diaphoretic.  Psychiatric: He has a normal mood and affect. His behavior is normal. Judgment and thought content normal.  Nursing note and vitals reviewed.    ED Treatments / Results  Labs (all labs ordered are listed, but only abnormal results are displayed) Labs Reviewed  BASIC METABOLIC PANEL - Abnormal; Notable for the following:       Result Value   Chloride 99 (*)    Glucose, Bld 118 (*)    All other components within normal  limits  CBC - Abnormal; Notable for the following:    MCHC 36.1 (*)    All other components within normal limits  D-DIMER, QUANTITATIVE (NOT AT [facility])  I-STAT TROPOININ, ED    EKG  EKG Interpretation  Date/Time:  Friday July 24 2016 15:33:57 EST Ventricular Rate:  62 PR Interval:  130 QRS Duration: 102 QT Interval:  390 QTC Calculation: 395 R Axis:   6 Text Interpretation:  Normal sinus rhythm Normal ECG Normal sinus rhythm Confirmed by Gerald Leitz ([id number]) on 07/24/2016 11:37:46 PM       Radiology Dg Chest 2 View  Result Date: 07/24/2016 CLINICAL DATA:  Chest pain EXAM: CHEST  2 VIEW COMPARISON:  04/27/2016 CXR FINDINGS: The heart size and mediastinal contours are within normal limits. Both lungs are clear. Stable pulmonary scarring in the right upper lobe. The visualized skeletal structures are unremarkable. IMPRESSION: No active cardiopulmonary disease. Electronically Signed   By: Ashley Royalty M.D.   On: 07/24/2016 16:25    Procedures Procedures (including critical care  time)  Medications Ordered in ED Medications - No data to display   Initial Impression / Assessment and Plan / ED Course  I have reviewed the triage vital signs and the nursing notes.  Pertinent labs & imaging results that were available during my care of the patient were reviewed by me and considered in my medical decision making (see chart for details).  Clinical Course as of Jul 24 2334  Fri Jul 24, 2016  2335 Discussed with Dr. Roel Cluck who will admit for further evaluation and monitoring.  Tele Obs  [HM]    Clinical Course User Index [HM] Cashmere Harmes, PA-C    Pt with syncope.  He said without aura. Concern for potential cardiac etiology. EKG normal sinus rhythm and labs reassuring. Chest x-ray unremarkable. Initial troponin negative. Patient will need admission for monitoring and possible stress test in the morning.  No orthostasis.    Orthostatic VS for the past 24 hrs:  BP- Lying Pulse- Lying BP- Sitting Pulse- Sitting BP- Standing at 0 minutes Pulse- Standing at 0 minutes  07/24/16 2237 118/61 62 130/76 65 133/78 69    Final Clinical Impressions(s) / ED Diagnoses   Final diagnoses:  Syncope, unspecified syncope type    New Prescriptions New Prescriptions   No medications on file     Abigail Butts, PA-C 07/25/16 [place], MD 07/29/16 [id number]

## 2016-07-25 ENCOUNTER — Observation Stay (HOSPITAL_BASED_OUTPATIENT_CLINIC_OR_DEPARTMENT_OTHER): Payer: BC Managed Care – PPO

## 2016-07-25 DIAGNOSIS — R55 Syncope and collapse: Secondary | ICD-10-CM

## 2016-07-25 DIAGNOSIS — R42 Dizziness and giddiness: Secondary | ICD-10-CM

## 2016-07-25 DIAGNOSIS — I10 Essential (primary) hypertension: Secondary | ICD-10-CM | POA: Diagnosis not present

## 2016-07-25 LAB — COMPREHENSIVE METABOLIC PANEL
ALK PHOS: 51 U/L (ref 38–126)
ALT: 29 U/L (ref 17–63)
ANION GAP: 9 (ref 5–15)
AST: 26 U/L (ref 15–41)
Albumin: 3.7 g/dL (ref 3.5–5.0)
BUN: 11 mg/dL (ref 6–20)
CALCIUM: 9 mg/dL (ref 8.9–10.3)
CO2: 27 mmol/L (ref 22–32)
Chloride: 101 mmol/L (ref 101–111)
Creatinine, Ser: 1.03 mg/dL (ref 0.61–1.24)
GFR calc non Af Amer: >60 mL/min (ref >60)
Glucose, Bld: 139 mg/dL — ABNORMAL HIGH (ref 65–99)
Potassium: 3.6 mmol/L (ref 3.5–5.1)
SODIUM: 137 mmol/L (ref 135–145)
TOTAL PROTEIN: 6.9 g/dL (ref 6.5–8.1)
Total Bilirubin: 0.6 mg/dL (ref 0.3–1.2)

## 2016-07-25 LAB — ECHOCARDIOGRAM COMPLETE
HEIGHTINCHES: 69 in
Weight: 5091.2 oz

## 2016-07-25 LAB — GLUCOSE, CAPILLARY: GLUCOSE-CAPILLARY: 107 mg/dL — AB (ref 65–99)

## 2016-07-25 LAB — CBC
HCT: 40.6 % (ref 39.0–52.0)
HEMOGLOBIN: 14.1 g/dL (ref 13.0–17.0)
MCH: 32.8 pg (ref 26.0–34.0)
MCHC: 34.7 g/dL (ref 30.0–36.0)
MCV: 94.4 fL (ref 78.0–100.0)
Platelets: 266 10*3/uL (ref 150–400)
RBC: 4.3 MIL/uL (ref 4.22–5.81)
RDW: 12.7 % (ref 11.5–15.5)
WBC: 6.4 10*3/uL (ref 4.0–10.5)

## 2016-07-25 LAB — TROPONIN I
Troponin I: <0.03 ng/mL (ref <0.03)
Troponin I: <0.03 ng/mL (ref <0.03)

## 2016-07-25 LAB — PHOSPHORUS: Phosphorus: 4 mg/dL (ref 2.5–4.6)

## 2016-07-25 LAB — MAGNESIUM: Magnesium: 2.1 mg/dL (ref 1.7–2.4)

## 2016-07-25 LAB — D-DIMER, QUANTITATIVE: D-Dimer, Quant: <0.27 ug/mL-FEU (ref 0.00–0.50)

## 2016-07-25 MED ORDER — ASPIRIN EC 81 MG PO TBEC: 81.0000 mg | DELAYED_RELEASE_TABLET | Freq: Every day | ORAL | 0 refills | Status: DC

## 2016-07-25 MED ORDER — NITROGLYCERIN 0.4 MG SL SUBL: 0.4000 mg | SUBLINGUAL_TABLET | SUBLINGUAL | 12 refills | Status: DC | PRN

## 2016-07-25 MED ORDER — SODIUM CHLORIDE 0.9 % IV SOLN
INTRAVENOUS | Status: DC
  Administered 2016-07-25: 02:00:00 via INTRAVENOUS

## 2016-07-25 MED ORDER — ENOXAPARIN SODIUM 40 MG/0.4ML ~~LOC~~ SOLN: 40.0000 mg | Freq: Every day | SUBCUTANEOUS | Status: DC

## 2016-07-25 MED ORDER — PERFLUTREN LIPID MICROSPHERE
1.0000 mL | INTRAVENOUS | Status: AC | PRN
  Administered 2016-07-25: 2 mL via INTRAVENOUS
  Filled 2016-07-25: qty 10

## 2016-07-25 MED ORDER — SIMVASTATIN 40 MG PO TABS
40.0000 mg | ORAL_TABLET | Freq: Every day | ORAL | Status: DC
  Administered 2016-07-25: 40 mg via ORAL
  Filled 2016-07-25: qty 1

## 2016-07-25 MED ORDER — SODIUM CHLORIDE 0.9% FLUSH
3.0000 mL | Freq: Two times a day (BID) | INTRAVENOUS | Status: DC
  Administered 2016-07-25: 3 mL via INTRAVENOUS

## 2016-07-25 NOTE — Discharge Summary (Signed)
AUSITN MANY [id number] DOB: [birth year] DOA: 07/24/2016  PCP: Eliezer Lofts, MD  Admit date: 07/24/2016  Discharge date: 07/25/2016  Admitted From: Home   Disposition:  Home   Recommendations for Outpatient Follow-up:   Follow up with PCP in 1-2 weeks  PCP Please obtain BMP/CBC, 2 view CXR in 1week,  (see Discharge instructions)   PCP Please follow up on the following pending results: None   Home Health: None   Equipment/Devices: None  Consultations: None Discharge Condition: Stable   CODE STATUS: Full   Diet Recommendation: Heart Healthy    Chief Complaint  Patient presents with  . Near Syncope     Brief history of present illness from the day of admission and additional interim summary     51 year old male with history of morbid obesity, hypertension, obstructive sleep apnea, dyslipidemia who was visiting her friend at [facility], while he was sitting he had a brief episode of chest discomfort which he explains as feeling of pressure with some sweating, this lasted for a few minutes and went away by itself, not related to exertion, did not get short of breath, he had no radiation of this sensation. Was admitted for chest pain workup.    Hospital issues addressed    1.Atypical chest discomfort. Patient denies that he was ever syncopal. Patient is not orthostatic, completely symptom free this morning, EKG nonacute, troponin negative 3, echocardiogram obtained shows a preserved EF of 60% without any wall motion abnormality, I related the patient for 5 minutes in the hallway and then he climbed to flights of stairs without any chest discomfort. At this point I will place him on aspirin and sublingual nitroglycerin as needed with outpatient PCP and cardiology follow-up for outpatient stress test.  He is completely symptom free. Stopped his NSAIDs for now.  2. Essential hypertension. Blood pressure stable, continue ARB, follow with PCP  3. Dyslipidemia. Tinea home dose statin and follow with PCP.  4. OSA. Outpatient management. No change   Discharge diagnosis     Active Problems:   Hyperlipidemia   Obstructive sleep apnea   Essential hypertension, benign   Family history of premature CAD   Syncope   Postural dizziness with presyncope    Discharge instructions    Discharge Instructions    Diet - low sodium heart healthy    Complete by:  As directed    Discharge instructions    Complete by:  As directed    Do not operate any heavy machinery  you have your stress test and have been cleared to do so by the cardiologist.   Follow with Primary MD Eliezer Lofts, MD in 7 days   Get CBC, CMP, 2 view Chest X ray checked  by Primary MD or SNF MD in 5-7 days ( we routinely change or add medications that can affect your baseline labs and fluid status, therefore we recommend that you get the mentioned basic workup next visit with your PCP, your PCP may decide not to get them or  add new tests based on their clinical decision)   Activity: As tolerated with Full fall precautions use walker/cane & assistance as needed   Disposition Home    Diet:    Heart Healthy    For Heart failure patients - Check your Weight same time everyday, if you gain over 2 pounds, or you develop in leg swelling, experience more shortness of breath or chest pain, call your Primary MD immediately. Follow Cardiac Low Salt Diet and 1.5 lit/day fluid restriction.   On your next visit with your primary care physician please Get Medicines reviewed and adjusted.   Please request your Prim.MD to go over all Hospital Tests and Procedure/Radiological results at the follow up, please get all Hospital records sent to your Prim MD by signing hospital release before you go home.   If you experience worsening of your  admission symptoms, develop shortness of breath, life threatening emergency, suicidal or homicidal thoughts you must seek medical attention immediately by calling 911 or calling your MD immediately  if symptoms less severe.  You Must read complete instructions/literature along with all the possible adverse reactions/side effects for all the Medicines you take and that have been prescribed to you. Take any new Medicines after you have completely understood and accpet all the possible adverse reactions/side effects.   Do not drive, operate heavy machinery, perform activities at heights, swimming or participation in water activities or provide baby sitting services if your were admitted for syncope or siezures until you have seen by Primary MD or a Neurologist and advised to do so again.  Do not drive when taking Pain medications.    Do not take more than prescribed Pain, Sleep and Anxiety Medications  Special Instructions: If you have smoked or chewed Tobacco  in the last 2 yrs please stop smoking, stop any regular Alcohol  and or any Recreational drug use.  Wear Seat belts while driving.   Please note  You were cared for by a hospitalist during your hospital stay. If you have any questions about your discharge medications or the care you received while you were in the hospital after you are discharged, you can call the unit and asked to speak with the hospitalist on call if the hospitalist that took care of you is not available. Once you are discharged, your primary care physician will handle any further medical issues. Please note that NO REFILLS for any discharge medications will be authorized once you are discharged, as it is imperative that you return to your primary care physician (or establish a relationship with a primary care physician if you do not have one) for your aftercare needs so that they can reassess your need for medications and monitor your lab values.   Increase activity slowly     Complete by:  As directed       Discharge Medications   Allergies as of 07/25/2016   No Known Allergies     Medication List    STOP taking these medications   ibuprofen 200 MG tablet Commonly known as:  ADVIL,MOTRIN     TAKE these medications   aspirin EC 81 MG tablet Take 1 tablet (81 mg total) by mouth daily.   clindamycin 1 % gel Commonly known as:  CLINDAGEL Apply topically 2 (two) times daily. What changed:  how much to take  when to take this  reasons to take this   losartan 100 MG tablet Commonly known as:  COZAAR Take 1 tablet (100 mg total)  by mouth daily.   nitroGLYCERIN 0.4 MG SL tablet Commonly known as:  NITROSTAT Place 1 tablet (0.4 mg total) under the tongue every 5 (five) minutes as needed for chest pain.   omeprazole 40 MG capsule Commonly known as:  PRILOSEC Take 1 capsule (40 mg total) by mouth daily.   PRESCRIPTION MEDICATION Inhale into the lungs at bedtime. CPAP   simvastatin 40 MG tablet Commonly known as:  ZOCOR Take 1 tablet (40 mg total) by mouth daily.   triamterene-hydrochlorothiazide 37.5-25 MG tablet Commonly known as:  MAXZIDE-25 Take 1 tablet by mouth daily.       Follow-up Information    Eliezer Lofts, MD. Schedule an appointment as soon as possible for a visit in 1 week(s).   Specialty:  Family Medicine Contact information: [place] [address] [phone]        Adrian Prows, MD. Schedule an appointment as soon as possible for a visit in 2 day(s).   Specialty:  Cardiology Why:  Outpatient stress test for chest pain call your PCP or make an appointment directly Contact information: [place] [address] [phone]           Major procedures and Radiology Reports - PLEASE review detailed and final reports thoroughly  -     TTE  Left ventricle: The cavity size was normal. There was moderate concentric hypertrophy. Systolic function was normal. The  estimated ejection fraction was in the range of 60% to 65%. Wall motion was normal; there were no regional wall motion abnormalities. Left ventricular diastolic function parameters were normal. - Aortic valve: Poorly visualized. Valve area (VTI): 4.24 cm^2. Valve area (Vmax): 4.05 cm^2. Valve area (Vmean): 4 cm^2. - Left atrium: The atrium was mildly dilated. - Pulmonary arteries: Systolic pressure could not be accurately estimated.   Dg Chest 2 View  Result Date: 07/24/2016 CLINICAL DATA:  Chest pain EXAM: CHEST  2 VIEW COMPARISON:  04/27/2016 CXR FINDINGS: The heart size and mediastinal contours are within normal limits. Both lungs are clear. Stable pulmonary scarring in the right upper lobe. The visualized skeletal structures are unremarkable. IMPRESSION: No active cardiopulmonary disease. Electronically Signed   By: Ashley Royalty M.D.   On: 07/24/2016 16:25    Micro Results     No results found for this or any previous visit (from the past 240 hour(s)).  Today   Subjective    Todd Owens today has no headache,no chest abdominal pain,no new weakness tingling or numbness, feels much better wants to go home today.     Objective   Blood pressure (!) 136/53, pulse 92, temperature 98.1 F (36.7 C), temperature source Oral, resp. rate 18, height 5\' 9"  (1.753 m), weight (!) 144.3 kg (318 lb 3.2 oz), SpO2 100 %.   Intake/Output Summary (Last 24 hours) at 07/25/16 1404 Last data filed at 07/25/16 1319  Gross per 24 hour  Intake           1567.5 ml  Output                0 ml  Net           1567.5 ml    Exam Awake Alert, Oriented x 3, No new F.N deficits, Normal affect Remer.AT,PERRAL Supple Neck,No JVD, No cervical lymphadenopathy appriciated.  Symmetrical Chest wall movement, Good air movement bilaterally, CTAB RRR,No Gallops,Rubs or new Murmurs, No Parasternal Heave +ve B.Sounds, Abd Soft, Non tender, No organomegaly appriciated,  No rebound -guarding or rigidity. No Cyanosis,  Clubbing or edema, No new Rash or bruise   Data Review   CBC w Diff: Lab Results  Component Value Date   WBC 6.4 07/25/2016   HGB 14.1 07/25/2016   HCT 40.6 07/25/2016   PLT 266 07/25/2016   LYMPHOPCT 34.4 10/04/2015   MONOPCT 5.9 10/04/2015   EOSPCT 1.4 10/04/2015   BASOPCT 0.6 10/04/2015    CMP: Lab Results  Component Value Date   NA 137 07/25/2016   K 3.6 07/25/2016   CL 101 07/25/2016   CO2 27 07/25/2016   BUN 11 07/25/2016   CREATININE 1.03 07/25/2016   PROT 6.9 07/25/2016   ALBUMIN 3.7 07/25/2016   BILITOT 0.6 07/25/2016   ALKPHOS 51 07/25/2016   AST 26 07/25/2016   ALT 29 07/25/2016  .   Total Time in preparing paper work, data evaluation and todays exam - 35 minutes  Thurnell Lose M.D on 07/25/2016 at 2:04 PM  [facility]   Office  [phone]

## 2016-07-25 NOTE — Progress Notes (Signed)
Received report from ED RN, Clarise Cruz.

## 2016-07-25 NOTE — Progress Notes (Signed)
Patient's resting BP 111/50 HR 65. Patient ambulated in hall and up and down stairs BP 136/53 HR 92 on monitor. Patient tolerated ambulation well. Dr. Candiss Norse paged to update.

## 2016-07-25 NOTE — Progress Notes (Signed)
Todd Owens to be D/C'd Home per MD order.  Discussed with the patient and all questions fully answered.  VSS, Skin clean, dry and intact without evidence of skin break down, no evidence of skin tears noted. IV catheter discontinued intact. Site without signs and symptoms of complications. Dressing and pressure applied.  An After Visit Summary was printed and given to the patient. Patient received prescription.  D/c education completed with patient/family including follow up instructions, medication list, d/c activities limitations if indicated, with other d/c instructions as indicated by MD - patient able to verbalize understanding, all questions fully answered.   Patient instructed to return to ED, call 911, or call MD for any changes in condition.   Patient escorted via [place], and D/C home via private auto.  Todd Owens C 07/25/2016 2:14 PM

## 2016-07-25 NOTE — Discharge Instructions (Signed)
Do not operate any heavy machinery  you have your stress test and have been cleared to do so by the cardiologist.    Follow with Primary MD Eliezer Lofts, MD in 7 days   Get CBC, CMP, 2 view Chest X ray checked  by Primary MD or SNF MD in 5-7 days ( we routinely change or add medications that can affect your baseline labs and fluid status, therefore we recommend that you get the mentioned basic workup next visit with your PCP, your PCP may decide not to get them or add new tests based on their clinical decision)   Activity: As tolerated with Full fall precautions use walker/cane & assistance as needed   Disposition Home    Diet:    Heart Healthy    For Heart failure patients - Check your Weight same time everyday, if you gain over 2 pounds, or you develop in leg swelling, experience more shortness of breath or chest pain, call your Primary MD immediately. Follow Cardiac Low Salt Diet and 1.5 lit/day fluid restriction.   On your next visit with your primary care physician please Get Medicines reviewed and adjusted.   Please request your Prim.MD to go over all Hospital Tests and Procedure/Radiological results at the follow up, please get all Hospital records sent to your Prim MD by signing hospital release before you go home.   If you experience worsening of your admission symptoms, develop shortness of breath, life threatening emergency, suicidal or homicidal thoughts you must seek medical attention immediately by calling 911 or calling your MD immediately  if symptoms less severe.  You Must read complete instructions/literature along with all the possible adverse reactions/side effects for all the Medicines you take and that have been prescribed to you. Take any new Medicines after you have completely understood and accpet all the possible adverse reactions/side effects.   Do not drive, operate heavy machinery, perform activities at heights, swimming or participation in water activities or  provide baby sitting services if your were admitted for syncope or siezures until you have seen by Primary MD or a Neurologist and advised to do so again.  Do not drive when taking Pain medications.    Do not take more than prescribed Pain, Sleep and Anxiety Medications  Special Instructions: If you have smoked or chewed Tobacco  in the last 2 yrs please stop smoking, stop any regular Alcohol  and or any Recreational drug use.  Wear Seat belts while driving.   Please note  You were cared for by a hospitalist during your hospital stay. If you have any questions about your discharge medications or the care you received while you were in the hospital after you are discharged, you can call the unit and asked to speak with the hospitalist on call if the hospitalist that took care of you is not available. Once you are discharged, your primary care physician will handle any further medical issues. Please note that NO REFILLS for any discharge medications will be authorized once you are discharged, as it is imperative that you return to your primary care physician (or establish a relationship with a primary care physician if you do not have one) for your aftercare needs so that they can reassess your need for medications and monitor your lab values.

## 2016-07-25 NOTE — Progress Notes (Signed)
NURSING PROGRESS NOTE  [facility] [id number] Admission Data: 07/25/2016 2:36 AM Attending Provider: Toy Baker, MD PCP:Amy Diona Browner, MD Code Status: FULL  Allergies:  Patient has no known allergies. Past Medical History:   has a past medical history of Acute sinusitis, unspecified; Dermatophytosis of nail; Esophageal reflux; Essential hypertension, benign; Impotence of organic origin; Morbid obesity ([place]); Obstructive sleep apnea (adult) (pediatric); Other and unspecified hyperlipidemia; Other malaise and fatigue; Other testicular hypofunction; Pure hypercholesterolemia; Rosacea; and Routine general medical examination at a health care facility. Past Surgical History:   has a past surgical history that includes Back surgery (2003); Knee surgery (2005); PFT (2007); cardiolyte (2001); and Wrist surgery (Right). Social History:   reports that he quit smoking about 17 years ago. He has never used smokeless tobacco. He reports that he does not drink alcohol or use drugs.  Todd Owens is a 51 y.o. male patient admitted from ED:   Last Documented Vital Signs: Blood pressure 123/67, pulse 60, temperature 97.8 F (36.6 C), temperature source Oral, resp. rate 18, height 5\' 9"  (1.753 m), weight (!) 144.5 kg (318 lb 8 oz), SpO2 99 %.  Cardiac Monitoring: Box # 28 in place. Cardiac monitor yields:normal sinus rhythm.  IV Fluids:  IV in place, occlusive dsg intact without redness, IV cath antecubital left, condition patent and no redness normal saline.   Skin: Appropriate for ethnicity and intact  Patient orientated to room.  Admission inpatient armband information verified with patient to include name and date of birth and placed on patient arm. Side rails up x 2, fall assessment and education completed with patient. Patient able to verbalize understanding of risk associated with falls and verbalized understanding to call for assistance before getting out of bed. Call light within reach.  Patient able to voice and demonstrate understanding of unit orientation instructions.    Will continue to evaluate and treat per MD orders.  Clydell Hakim RN, BSN

## 2016-07-26 LAB — HEMOGLOBIN A1C
Hgb A1c MFr Bld: 5.6 % (ref 4.8–5.6)
Mean Plasma Glucose: 114 mg/dL

## 2016-07-27 ENCOUNTER — Telehealth: Payer: Self-pay | Admitting: *Deleted

## 2016-07-27 NOTE — Telephone Encounter (Signed)
Patient called stating that he needs a referral for a stress test per his hospital stay over the weekend. Patient has an appointment scheduled with Dr. Diona Browner 07/30/16.

## 2016-07-28 NOTE — Telephone Encounter (Signed)
Reviewed hospital note.. No urgent need for stress test.. Will discuss and schedule at appt in 2 days.

## 2016-07-28 NOTE — Telephone Encounter (Signed)
Wife called - she states that dr at ed told him that he is not able to work until he is released by a cardiologist.   cb number is [phone] or [phone]

## 2016-07-28 NOTE — Telephone Encounter (Signed)
Noted.  Have pt remain out of work until follow up appt. Okay to write note for pt.

## 2016-07-29 NOTE — Telephone Encounter (Signed)
Left message for Todd Owens to return my call.  He is scheduled for follow up tomorrow with Dr. Diona Browner at 9:00 am.

## 2016-07-29 NOTE — Telephone Encounter (Signed)
Mr. Letter notified we would get his stress test scheduled tomorrow when he follow up with Dr. Diona Browner.  He states he just can't go back to work until after his stress test per hospital MD.

## 2016-07-29 NOTE — Telephone Encounter (Signed)
Patient returned Donna's call.  He said his phone didn't ring when she called earlier.

## 2016-07-30 ENCOUNTER — Other Ambulatory Visit: Payer: Self-pay | Admitting: Family Medicine

## 2016-07-30 ENCOUNTER — Ambulatory Visit (INDEPENDENT_AMBULATORY_CARE_PROVIDER_SITE_OTHER): Payer: BC Managed Care – PPO | Admitting: Family Medicine

## 2016-07-30 ENCOUNTER — Encounter: Payer: Self-pay | Admitting: Family Medicine

## 2016-07-30 VITALS — BP 100/70 | HR 66 | Temp 98.5°F | Ht 69.0 in | Wt 323.5 lb

## 2016-07-30 DIAGNOSIS — I10 Essential (primary) hypertension: Secondary | ICD-10-CM | POA: Diagnosis not present

## 2016-07-30 DIAGNOSIS — E785 Hyperlipidemia, unspecified: Secondary | ICD-10-CM

## 2016-07-30 DIAGNOSIS — R55 Syncope and collapse: Secondary | ICD-10-CM | POA: Insufficient documentation

## 2016-07-30 LAB — BASIC METABOLIC PANEL
BUN: 13 mg/dL (ref 6–23)
CO2: 32 mEq/L (ref 19–32)
Calcium: 9.6 mg/dL (ref 8.4–10.5)
Chloride: 100 mEq/L (ref 96–112)
Creatinine, Ser: 1.01 mg/dL (ref 0.40–1.50)
GFR: 82.97 mL/min (ref >60.00)
Glucose, Bld: 94 mg/dL (ref 70–99)
POTASSIUM: 4.1 meq/L (ref 3.5–5.1)
Sodium: 138 mEq/L (ref 135–145)

## 2016-07-30 LAB — LIPID PANEL
CHOL/HDL RATIO: 4
Cholesterol: 127 mg/dL (ref 0–200)
HDL: 34.1 mg/dL — ABNORMAL LOW (ref >39.00)
LDL CALC: 80 mg/dL (ref 0–99)
NonHDL: 92.96
TRIGLYCERIDES: 67 mg/dL (ref 0.0–149.0)
VLDL: 13.4 mg/dL (ref 0.0–40.0)

## 2016-07-30 LAB — CBC WITH DIFFERENTIAL/PLATELET
BASOS PCT: 0.7 % (ref 0.0–3.0)
Basophils Absolute: 0 10*3/uL (ref 0.0–0.1)
EOS ABS: 0.1 10*3/uL (ref 0.0–0.7)
EOS PCT: 2.5 % (ref 0.0–5.0)
HEMATOCRIT: 42.7 % (ref 39.0–52.0)
HEMOGLOBIN: 14.8 g/dL (ref 13.0–17.0)
LYMPHS PCT: 33.6 % (ref 12.0–46.0)
Lymphs Abs: 2 10*3/uL (ref 0.7–4.0)
MCHC: 34.7 g/dL (ref 30.0–36.0)
MCV: 96.2 fl (ref 78.0–100.0)
Monocytes Absolute: 0.4 10*3/uL (ref 0.1–1.0)
Monocytes Relative: 6 % (ref 3.0–12.0)
Neutro Abs: 3.3 10*3/uL (ref 1.4–7.7)
Neutrophils Relative %: 57.2 % (ref 43.0–77.0)
Platelets: 277 10*3/uL (ref 150.0–400.0)
RBC: 4.44 Mil/uL (ref 4.22–5.81)
RDW: 13.2 % (ref 11.5–15.5)
WBC: 5.9 10*3/uL (ref 4.0–10.5)

## 2016-07-30 NOTE — Progress Notes (Signed)
   Subjective:    Patient ID: Todd Owens, male    DOB: 1966/04/20, 51 y.o.   MRN: [id number]  HPI  51 year old male presents for follow up hospitalization. Admitted on 07/25/2015 for near syncope,  Per pt not really chest discomfort, sweating at rest Discharge: 07/25/2016  No proceeding symptoms.. No palpitations.  No anxious,not panic attack but he has been stress out lately. He had not eaten breakfast, but he never does. He did have Reeses cup and nabs and coke... Episode occurred 45 min after  Surgical center said  CBG 98  After spell he felt fine.. Wife drove him to hospital  Recommendations for Outpatient Follow-up:  Follow up with PCP in 1-2 weeks PCP Please obtain BMP/CBC, 2 view CXR in 1week   EKG was non acute ECHO: EF 60%  Labs nml.. No anemia, nml electrolytes  Nml CXR  d dimer Placed on ASA and sublingual nitroglycerin BP was stable Continued statin   BP Readings from Last 3 Encounters:  07/30/16 100/70  07/25/16 (!) 136/53  06/03/16 108/74    Today he reports:   He has been resting.  No further dizzy spells. No syncope. NO SOB, no CP. Mild headache.  No numbness, no weakness.  No symptoms with sweeping floors.  No respiratory symptoms.  He works at [employer].. Not at work since 07/24/2016.    Low risk [email or web address] Review of Systems  Constitutional: Negative for fatigue and fever.  HENT: Negative for ear pain.   Eyes: Negative for pain.  Respiratory: Negative for chest tightness and shortness of breath.   Cardiovascular: Negative for chest pain.  Gastrointestinal: Negative for abdominal distention.       Objective:   Physical Exam  Constitutional: Vital signs are normal. He appears well-developed and well-nourished.  Morbidly obese  HENT:  Head: Normocephalic.  Right Ear: Hearing normal.  Left Ear: Hearing normal.  Nose: Nose normal.  Mouth/Throat: Oropharynx is clear and moist and mucous membranes are normal.  Neck: Trachea normal. Carotid  bruit is not present. No thyroid mass and no thyromegaly present.  Cardiovascular: Normal rate, regular rhythm and normal pulses.  Exam reveals no gallop, no distant heart sounds and no friction rub.   No murmur heard. No peripheral edema  Pulmonary/Chest: Effort normal and breath sounds normal. No respiratory distress.  Skin: Skin is warm, dry and intact. No rash noted.  Psychiatric: He has a normal mood and affect. His speech is normal and behavior is normal. Thought content normal.          Assessment & Plan:

## 2016-07-30 NOTE — Assessment & Plan Note (Signed)
BP appears low normal. Pt will follow at home .Marland Kitchen May need to decrease meds if overtreating..? Contributing to presyncope.

## 2016-07-30 NOTE — Progress Notes (Signed)
Pre visit review using our clinic review tool, if applicable. No additional management support is needed unless otherwise documented below in the visit note. 

## 2016-07-30 NOTE — Patient Instructions (Addendum)
Keep appt as scheduled with cardiology on Tuesday 1:45.  Follow blood pressure and pulse at home.. Call  ASAP either number BP < 90/60 regularly or if pulse < 60 or > 90.

## 2016-07-30 NOTE — Assessment & Plan Note (Signed)
No further episodes. Agree with further cardiac eval. Continue ASA.  Refer to cardiology. Re-eval labs.  No clear indication for CXR.

## 2016-07-30 NOTE — Assessment & Plan Note (Signed)
Previously well controlled on moderate dose statin. Re-eval today.

## 2016-07-31 ENCOUNTER — Encounter: Payer: Self-pay | Admitting: *Deleted

## 2016-08-04 ENCOUNTER — Ambulatory Visit (INDEPENDENT_AMBULATORY_CARE_PROVIDER_SITE_OTHER): Payer: BC Managed Care – PPO | Admitting: Internal Medicine

## 2016-08-04 ENCOUNTER — Encounter: Payer: Self-pay | Admitting: Internal Medicine

## 2016-08-04 ENCOUNTER — Encounter: Payer: Self-pay | Admitting: *Deleted

## 2016-08-04 VITALS — BP 110/68 | HR 65 | Ht 69.0 in | Wt 328.2 lb

## 2016-08-04 DIAGNOSIS — E785 Hyperlipidemia, unspecified: Secondary | ICD-10-CM

## 2016-08-04 DIAGNOSIS — I10 Essential (primary) hypertension: Secondary | ICD-10-CM

## 2016-08-04 DIAGNOSIS — R55 Syncope and collapse: Secondary | ICD-10-CM | POA: Diagnosis not present

## 2016-08-04 NOTE — Progress Notes (Addendum)
New Outpatient Visit Date: 08/04/2016  Referring Provider: Jinny Sanders, MD [place], [place] [phone]  Chief Complaint: Fainting spell  HPI:  [name] is a 51 y.o. year-old male with history of hypertension, hyperlipidemia, and morbid obesity, who has been referred by Dr. Diona Browner for fainting episode. [name] reports that he was visiting a friend who had just undergone cervical spine surgery. He was seated in the recovery area and began feeling hot and tingly all over. He subsequently slumped over in his chair for short period of time (patient does not recall exact duration but believes it was only a few seconds to a minute). He did not have any other warning signs including chest pain, palpitations, and lightheadedness. He notes that he has had similar episodes twice while having blood drawn as well as when undergoing a nerve conduction velocity study/EMG. He otherwise has felt well, denying any chest pain, shortness of breath, palpitations, lightheadedness, orthopnea, and leg edema. He has sleep apnea and began wearing CPAP within the last week.  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 He has not returned to work since this episode occurred. He does not exercise regularly but has no limitations doing his usual activities. He denies a prior history of cardiovascular disease and underwent negative myocardial perfusion stress test in 2012. While hospitalized earlier this month, serial troponins were negative. Transthoracic echocardiogram showed normal LV size with moderate LVH and EF of 60-65%. Left atrium was mildly dilated. No significant valvular abnormalities were identified.  --------------------------------------------------------------------------------------------------  Cardiovascular History & Procedures: Cardiovascular Problems:  Syncope  Risk Factors:  Hypertension, hyperlipidemia, obesity, family history, and male  gender  Cath/PCI:  None  CV Surgery:  None  EP Procedures and Devices:  None  Non-Invasive Evaluation(s):  Transthoracic echocardiogram (07/25/16): Normal LV size with moderate LVH. LVEF 60-65%. Normal diastolic function. Mildly dilated aortic root. Mild left atrial enlargement. Normal RV size and function. No significant valvular abnormalities.  Pharmacologic myocardial perfusion stress test (04/28/11): Normal study without ischemia or scar. LVEF 54%.  Recent CV Pertinent Labs: Lab Results  Component Value Date   CHOL 127 07/30/2016   HDL 34.10 (L) 07/30/2016   LDLCALC 80 07/30/2016   TRIG 67.0 07/30/2016   CHOLHDL 4 07/30/2016   K 4.1 07/30/2016   MG 2.1 07/25/2016   BUN 13 07/30/2016   CREATININE 1.01 07/30/2016    --------------------------------------------------------------------------------------------------  Past Medical History:  Diagnosis Date  . Acute sinusitis, unspecified   . Dermatophytosis of nail   . Esophageal reflux   . Essential hypertension, benign   . Impotence of organic origin   . Morbid obesity ([place])   . Obstructive sleep apnea (adult) (pediatric)   . Other and unspecified hyperlipidemia   . Other malaise and fatigue   . Other testicular hypofunction   . Pure hypercholesterolemia   . Rosacea   . Routine general medical examination at a health care facility     Past Surgical History:  Procedure Laterality Date  . BACK SURGERY  2003  . cardiolyte  2001   neg  . KNEE SURGERY  2005  . PFT  2007   WNL  . WRIST SURGERY Right     Outpatient Encounter Prescriptions as of 08/04/2016  Medication Sig  . aspirin EC 81 MG tablet Take 1 tablet (81 mg total) by mouth daily.  . clindamycin (CLINDAGEL) 1 % gel Apply topically 2 (two) times daily. (Patient taking differently: Apply 1  application topically 2 (two) times daily as needed (adult acne on nose). )  . losartan (COZAAR) 100 MG tablet TAKE 1 TABLET (100 MG TOTAL) BY MOUTH DAILY.  .  nitroGLYCERIN (NITROSTAT) 0.4 MG SL tablet Place 1 tablet (0.4 mg total) under the tongue every 5 (five) minutes as needed for chest pain.  Marland Kitchen omeprazole (PRILOSEC) 40 MG capsule Take 1 capsule (40 mg total) by mouth daily.  Marland Kitchen PRESCRIPTION MEDICATION Inhale into the lungs at bedtime. CPAP  . simvastatin (ZOCOR) 40 MG tablet Take 1 tablet (40 mg total) by mouth daily.  Marland Kitchen triamterene-hydrochlorothiazide (MAXZIDE-25) 37.5-25 MG tablet TAKE 1 TABLET BY MOUTH ONCE DAILY   No facility-administered encounter medications on file as of 08/04/2016.     Allergies: Patient has no known allergies.  Social History   Social History  . Marital status: Married    Spouse name: N/A  . Number of children: N/A  . Years of education: N/A   Occupational History  . Not on file.   Social History Main Topics  . Smoking status: Former Smoker    Packs/day: 1.00    Years: 15.00    Quit date: 1995  . Smokeless tobacco: Never Used  . Alcohol use No  . Drug use: No  . Sexual activity: Yes   Other Topics Concern  . Not on file   Social History Narrative  . No narrative on file    Family History  Problem Relation Age of Onset  . Hypertension Mother   . Heart disease Mother     pacemaker  . Hypertension Father   . Stroke Father   . Heart disease Father 71  . Heart attack Father   . Heart disease Sister   . Heart attack Sister 21  . Heart failure Sister    Review of Systems: A 12-system review of systems was performed and was negative except as noted in the HPI.  --------------------------------------------------------------------------------------------------  Physical Exam: BP 110/68 (BP Location: Right Arm, Patient Position: Sitting, Cuff Size: Large)   Pulse 65   Ht 5\' 9"  (1.753 m)   Wt (!) 328 lb 4 oz (148.9 kg)   BMI 48.47 kg/m   Position Blood pressure (mmHg) Heart rate (bpm)  Lying 147/83 65  Sitting 133/81 69  Standing 164/86 75  Standing (3 minutes) 143/83 80   General:   Morbidly obese man, seated comfortably in the exam room. He is accompanied by his wife. HEENT: No conjunctival pallor or scleral icterus.  Moist mucous membranes.  OP clear. Neck: Supple without lymphadenopathy, thyromegaly, JVD, or HJR.  No carotid bruit. Lungs: Normal work of breathing.  Clear to auscultation bilaterally without wheezes or crackles. Heart: Regular rate and rhythm without murmurs, rubs, or gallops. Unable to assess PMI due to body habitus. Abd: Bowel sounds present.  Soft, nontender, nondistended. Unable to assess hepatosplenomegaly due to body habitus. Ext: Trace pretibial edema bilaterally.  Radial, PT, and DP pulses are 2+ bilaterally Skin: warm and dry without rash Neuro: CNIII-XII intact.  Strength and fine-touch sensation intact in upper and lower extremities bilaterally. Psych: Normal mood and affect.  EKG:  Normal sinus rhythm without significant abnormalities or changes from prior tracing on 07/24/16 (I have personally reviewed both tracings).  Lab Results  Component Value Date   WBC 5.9 07/30/2016   HGB 14.8 07/30/2016   HCT 42.7 07/30/2016   MCV 96.2 07/30/2016   PLT 277.0 07/30/2016    Lab Results  Component Value Date   NA  138 07/30/2016   K 4.1 07/30/2016   CL 100 07/30/2016   CO2 32 07/30/2016   BUN 13 07/30/2016   CREATININE 1.01 07/30/2016   GLUCOSE 94 07/30/2016   ALT 29 07/25/2016    Lab Results  Component Value Date   CHOL 127 07/30/2016   HDL 34.10 (L) 07/30/2016   LDLCALC 80 07/30/2016   TRIG 67.0 07/30/2016   CHOLHDL 4 07/30/2016    --------------------------------------------------------------------------------------------------  ASSESSMENT AND PLAN: Syncope The description of the patient's recent event as well as several prior episodes in the setting of having blood drawn or being stuck with a needle suggest a vasovagal etiology. EKG today and workup during his hospitalization were unrevealing with the exception of LVH by  echocardiogram. Though he has not had any chest pain, Mr. Roques has multiple risk factors for atherosclerotic cardiovascular disease, including hypertension, hyperlipidemia, obesity, and family history. Though he denies ever having chest pain, this was reported in the discharge summary from his recent hospitalization. We have agreed to proceed with an exercise tolerance test. If he does not have any further episodes and his stress test is normal, I think it would be reasonable for him to return to driving. If the stress test is abnormal, we will need to consider stress test with imaging versus coronary angiography. He should continue his current medication regimen, including low-dose aspirin. We discussed potential triggers for vasovagal episodes as well as avoiding situations where following/passing out could be dangerous.  Hyperlipidemia Most recent LDL was reasonable at 80. He should continue his current dose of simvastatin.  Hypertension Blood pressure is well controlled today. We will not make any changes to his current regimen.  Follow-up: Return to clinic in 6 weeks.  Nelva Bush, MD 08/04/2016 2:44 PM

## 2016-08-04 NOTE — Patient Instructions (Addendum)
Medication Instructions:  Your physician recommends that you continue on your current medications as directed. Please refer to the Current Medication list given to you today.   Labwork: none  Testing/Procedures: Your physician has requested that you have an exercise tolerance test. For further information please visit [email or web address]. Please also follow instruction sheet, as given.    Follow-Up: Your physician recommends that you schedule a follow-up appointment in: [place].    If you need a refill on your cardiac medications before your next appointment, please call your pharmacy.   Exercise Stress Electrocardiogram An exercise stress electrocardiogram is a test that is done to evaluate the blood supply to your heart. This test may also be called exercise stress electrocardiography. The test is done while you are walking on a treadmill. The goal of this test is to raise your heart rate. This test is done to find areas of poor blood flow to the heart by determining the extent of coronary artery disease (CAD). CAD is defined as narrowing in one or more heart (coronary) arteries of more than 70%. If you have an abnormal test result, this may mean that you are not getting adequate blood flow to your heart during exercise. Additional testing may be needed to understand why your test was abnormal. Tell a health care provider about:  Any allergies you have.  All medicines you are taking, including vitamins, herbs, eye drops, creams, and over-the-counter medicines.  Any problems you or family members have had with anesthetic medicines.  Any blood disorders you have.  Any surgeries you have had.  Any medical conditions you have.  Possibility of pregnancy, if this applies. What are the risks? Generally, this is a safe procedure. However, as with any procedure, complications can occur. Possible complications can include:  Pain or pressure in the following  areas:  Chest.  Jaw or neck.  Between your shoulder blades.  Radiating down your left arm.  Dizziness or light-headedness.  Shortness of breath.  Increased or irregular heartbeats.  Nausea or vomiting.  Heart attack (rare). What happens before the procedure?  Avoid all forms of caffeine 24 hours before your test or as directed by your health care provider. This includes coffee, tea (even decaffeinated tea), caffeinated sodas, chocolate, cocoa, and certain pain medicines.  Follow your health care provider's instructions regarding eating and drinking before the test.  Take your medicines as directed at regular times with water unless instructed otherwise. Exceptions may include:  If you have diabetes, ask how you are to take your insulin or pills. It is common to adjust insulin dosing the morning of the test.  If you are taking beta-blocker medicines, it is important to talk to your health care provider about these medicines well before the date of your test. Taking beta-blocker medicines may interfere with the test. In some cases, these medicines need to be changed or stopped 24 hours or more before the test.  If you wear a nitroglycerin patch, it may need to be removed prior to the test. Ask your health care provider if the patch should be removed before the test.  If you use an inhaler for any breathing condition, bring it with you to the test.  If you are an outpatient, bring a snack so you can eat right after the stress phase of the test.  Do not smoke for 4 hours prior to the test or as directed by your health care provider.  Do not apply lotions, powders, creams,  or oils on your chest prior to the test.  Wear loose-fitting clothes and comfortable shoes for the test. This test involves walking on a treadmill. What happens during the procedure?  Multiple patches (electrodes) will be put on your chest. If needed, small areas of your chest may have to be shaved to get  better contact with the electrodes. Once the electrodes are attached to your body, multiple wires will be attached to the electrodes and your heart rate will be monitored.  Your heart will be monitored both at rest and while exercising.  You will walk on a treadmill. The treadmill will be started at a slow pace. The treadmill speed and incline will gradually be increased to raise your heart rate. What happens after the procedure?  Your heart rate and blood pressure will be monitored after the test.  You may return to your normal schedule including diet, activities, and medicines, unless your health care provider tells you otherwise. This information is not intended to replace advice given to you by your health care provider. Make sure you discuss any questions you have with your health care provider. Document Released: 07/03/2000 Document Revised: 12/12/2015 Document Reviewed: 03/13/2013 Elsevier Interactive Patient Education  2017 [employer].

## 2016-08-07 ENCOUNTER — Encounter: Payer: Self-pay | Admitting: Internal Medicine

## 2016-08-10 ENCOUNTER — Telehealth: Payer: Self-pay | Admitting: Internal Medicine

## 2016-08-10 ENCOUNTER — Encounter: Payer: Self-pay | Admitting: Internal Medicine

## 2016-08-10 ENCOUNTER — Ambulatory Visit (INDEPENDENT_AMBULATORY_CARE_PROVIDER_SITE_OTHER): Payer: BC Managed Care – PPO

## 2016-08-10 DIAGNOSIS — R55 Syncope and collapse: Secondary | ICD-10-CM | POA: Diagnosis not present

## 2016-08-10 DIAGNOSIS — I10 Essential (primary) hypertension: Secondary | ICD-10-CM | POA: Diagnosis not present

## 2016-08-10 NOTE — Telephone Encounter (Signed)
This encounter was created in error - please disregard.

## 2016-08-10 NOTE — Telephone Encounter (Signed)
Received request from [employer] for STD .  Patient signed ROI and submitted Personal Check for $35 fee.  Mailed to [employer] via interoffice mail.

## 2016-08-12 LAB — EXERCISE TOLERANCE TEST
CHL CUP MPHR: 170 {beats}/min
CHL CUP RESTING HR STRESS: 70 {beats}/min
CSEPEDS: 48 s
CSEPEW: 8.2 METS
Exercise duration (min): 6 min
Peak HR: 155 {beats}/min
Percent HR: 91 %

## 2016-08-13 ENCOUNTER — Encounter: Payer: Self-pay | Admitting: *Deleted

## 2016-08-14 ENCOUNTER — Telehealth: Payer: Self-pay | Admitting: Internal Medicine

## 2016-08-14 NOTE — Telephone Encounter (Signed)
Received forms from [place], placed in nurse's box for Dr. Saunders Revel to sign

## 2016-08-14 NOTE — Telephone Encounter (Signed)
[employer] forms placed in Dr Darnelle Bos inbox to complete next time in the office.

## 2016-08-18 NOTE — Telephone Encounter (Signed)
[employer] forms completed by Dr End and given to [facility].

## 2016-08-18 NOTE — Telephone Encounter (Signed)
Sent via interoffice mail to [place] at [facility].

## 2016-08-19 ENCOUNTER — Other Ambulatory Visit: Payer: Self-pay | Admitting: Family Medicine

## 2016-09-09 ENCOUNTER — Encounter: Payer: Self-pay | Admitting: Pulmonary Disease

## 2016-09-15 ENCOUNTER — Ambulatory Visit: Payer: BC Managed Care – PPO | Admitting: Internal Medicine

## 2016-09-16 ENCOUNTER — Telehealth: Payer: Self-pay | Admitting: Pulmonary Disease

## 2016-09-16 NOTE — Telephone Encounter (Signed)
   DL the last month : 100%, AHI 1.4.  On autocpap 11-15 cm water.   Pls tell pt cpap machine works and to keep up the good work!  J. Shirl Harris, MD 09/16/2016, 9:15 AM [facility] Pager [phone] After 3 pm or if no answer, call [phone]

## 2016-09-17 NOTE — Telephone Encounter (Signed)
lmom tcb x1 and pt needs to schedule a follow up appointment per insurance

## 2016-09-17 NOTE — Telephone Encounter (Signed)
Patient scheduled with TP on 09/25/2016 - pr

## 2016-09-17 NOTE — Telephone Encounter (Signed)
Noted thanks will close message

## 2016-09-25 ENCOUNTER — Ambulatory Visit (INDEPENDENT_AMBULATORY_CARE_PROVIDER_SITE_OTHER): Payer: BC Managed Care – PPO | Admitting: Adult Health

## 2016-09-25 ENCOUNTER — Encounter: Payer: Self-pay | Admitting: Adult Health

## 2016-09-25 DIAGNOSIS — G4733 Obstructive sleep apnea (adult) (pediatric): Secondary | ICD-10-CM | POA: Diagnosis not present

## 2016-09-25 NOTE — Progress Notes (Signed)
@Patient  ID: Todd Owens, male    DOB: 19-Feb-1966, 51 y.o.   MRN: [id number]  Chief Complaint  Patient presents with  . Follow-up    OSA     Referring provider: Jinny Sanders, MD  HPI: 51 year old male seen for sleep consult November 2017 found to have moderate to severe sleep apnea   TEST  Sleep Study 05/2016 >AHI 29/hr .   09/25/2016  Follow up : OSA  Patient returns for a follow-up visit for sleep apnea. Patient was seen for sleep consult in November. He was set up for a sleep study that showed moderate to severe sleep apnea. He was started on C Pap at bedtime. Patient says he feels much improved. He is wearing his C Pap every single night. Get an about 7 hours. Download shows excellent compliance with average. Uses at 7.5 hours. He is on AutoSet 11-15 with H2O. AHI 1.7. Some leaks. Feels rested with no significant   No Known Allergies  Immunization History  Administered Date(s) Administered  . Influenza,inj,Quad PF,36+ Mos 05/26/2013, 03/27/2016  . Td 01/17/2010  . Tdap 04/18/2013    Past Medical History:  Diagnosis Date  . Acute sinusitis, unspecified   . Dermatophytosis of nail   . Esophageal reflux   . Essential hypertension, benign   . Impotence of organic origin   . Morbid obesity ([place])   . Obstructive sleep apnea (adult) (pediatric)   . Other and unspecified hyperlipidemia   . Other malaise and fatigue   . Other testicular hypofunction   . Pure hypercholesterolemia   . Rosacea   . Routine general medical examination at a health care facility     Tobacco History: History  Smoking Status  . Former Smoker  . Packs/day: 1.00  . Years: 15.00  . Quit date: 1995  Smokeless Tobacco  . Never Used   Counseling given: Not Answered   Outpatient Encounter Prescriptions as of 09/25/2016  Medication Sig  . aspirin 81 MG EC tablet TAKE 1 TABLET BY MOUTH EVERY DAY  . clindamycin (CLINDAGEL) 1 % gel Apply topically 2 (two) times daily. (Patient taking  differently: Apply 1 application topically 2 (two) times daily as needed (adult acne on nose). )  . losartan (COZAAR) 100 MG tablet TAKE 1 TABLET (100 MG TOTAL) BY MOUTH DAILY.  Marland Kitchen omeprazole (PRILOSEC) 40 MG capsule Take 1 capsule (40 mg total) by mouth daily.  Marland Kitchen PRESCRIPTION MEDICATION Inhale into the lungs at bedtime. CPAP  . simvastatin (ZOCOR) 40 MG tablet Take 1 tablet (40 mg total) by mouth daily.  Marland Kitchen triamterene-hydrochlorothiazide (MAXZIDE-25) 37.5-25 MG tablet TAKE 1 TABLET BY MOUTH ONCE DAILY  . [DISCONTINUED] nitroGLYCERIN (NITROSTAT) 0.4 MG SL tablet Place 1 tablet (0.4 mg total) under the tongue every 5 (five) minutes as needed for chest pain.   No facility-administered encounter medications on file as of 09/25/2016.      Review of Systems  Constitutional:   No  weight loss, night sweats,  Fevers, chills, fatigue, or  lassitude.  HEENT:   No headaches,  Difficulty swallowing,  Tooth/dental problems, or  Sore throat,                No sneezing, itching, ear ache, nasal congestion, post nasal drip,   CV:  No chest pain,  Orthopnea, PND, swelling in lower extremities, anasarca, dizziness, palpitations, syncope.   GI  No heartburn, indigestion, abdominal pain, nausea, vomiting, diarrhea, change in bowel habits, loss of appetite, bloody stools.  Resp: No shortness of breath with exertion or at rest.  No excess mucus, no productive cough,  No non-productive cough,  No coughing up of blood.  No change in color of mucus.  No wheezing.  No chest wall deformity  Skin: no rash or lesions.  GU: no dysuria, change in color of urine, no urgency or frequency.  No flank pain, no hematuria   MS:  No joint pain or swelling.  No decreased range of motion.  No back pain.    Physical Exam  BP 130/78 (BP Location: Right Arm, Cuff Size: Large)   Pulse 73   Ht 5\' 9"  (1.753 m)   Wt (!) 333 lb (151 kg)   SpO2 94%   BMI 49.18 kg/m   GEN: A/Ox3; pleasant , NAD, obese    HEENT:  [place]/AT,   EACs-clear, TMs-wnl, NOSE-clear, THROAT-clear, no lesions, no postnasal drip or exudate noted. Class 2 MP airway .   NECK:  Supple w/ fair ROM; no JVD; normal carotid impulses w/o bruits; no thyromegaly or nodules palpated; no lymphadenopathy.    RESP  Clear  P & A; w/o, wheezes/ rales/ or rhonchi. no accessory muscle use, no dullness to percussion  CARD:  RRR, no m/r/g, no peripheral edema, pulses intact, no cyanosis or clubbing.  GI:   Soft & nt; nml bowel sounds; no organomegaly or masses detected.   Musco: Warm bil, no deformities or joint swelling noted.   Neuro: alert, no focal deficits noted.    Skin: Warm, no lesions or rashes    BNP No results found for: BNP  ProBNP No results found for: PROBNP  Imaging: No results found.   Assessment & Plan:   Obstructive sleep apnea Well control on CPAP   Plan  Patient Instructions  Saline nasal rinses and gel As needed  (AYR gel)  Call back if want to change , the Dream Wear Gel nasal mask might work.  Continue on CPAP At bedtime   Work on weight loss  Do not drive if sleepy .  Follow up with Dr. Elsworth Soho  In 6 months and As needed      MORBID OBESITY Wt loss.      Rexene Edison, NP 09/25/2016

## 2016-09-25 NOTE — Assessment & Plan Note (Signed)
Wt loss  

## 2016-09-25 NOTE — Patient Instructions (Addendum)
Saline nasal rinses and gel As needed  (AYR gel)  Call back if want to change , the Dream Wear Gel nasal mask might work.  Continue on CPAP At bedtime   Work on weight loss  Do not drive if sleepy .  Follow up with Dr. Elsworth Soho  In 6 months and As needed

## 2016-09-25 NOTE — Assessment & Plan Note (Signed)
Well control on CPAP   Plan  Patient Instructions  Saline nasal rinses and gel As needed  (AYR gel)  Call back if want to change , the Dream Wear Gel nasal mask might work.  Continue on CPAP At bedtime   Work on weight loss  Do not drive if sleepy .  Follow up with Dr. Elsworth Soho  In 6 months and As needed

## 2016-09-28 NOTE — Progress Notes (Signed)
Reviewed & agree with plan  

## 2016-10-19 ENCOUNTER — Telehealth: Payer: Self-pay | Admitting: Internal Medicine

## 2016-10-19 NOTE — Telephone Encounter (Signed)
Contacted by [employer] re: records release fax request.  This fax was not received. Called patient to verify that he has requested or applied for Disability that this third party records requesting company is assisting.  LMOV to call office.

## 2016-10-19 NOTE — Telephone Encounter (Signed)
Received faxed request from [employer].  Forward to [employer]. Via fax.   Scanned to documents

## 2016-10-21 ENCOUNTER — Encounter: Payer: Self-pay | Admitting: Internal Medicine

## 2016-10-21 ENCOUNTER — Ambulatory Visit (INDEPENDENT_AMBULATORY_CARE_PROVIDER_SITE_OTHER): Payer: BC Managed Care – PPO | Admitting: Internal Medicine

## 2016-10-21 VITALS — BP 118/72 | HR 64 | Ht 69.0 in | Wt 332.5 lb

## 2016-10-21 DIAGNOSIS — R55 Syncope and collapse: Secondary | ICD-10-CM | POA: Diagnosis not present

## 2016-10-21 DIAGNOSIS — I10 Essential (primary) hypertension: Secondary | ICD-10-CM | POA: Diagnosis not present

## 2016-10-21 NOTE — Progress Notes (Signed)
Follow-up Outpatient Visit Date: 10/21/2016  Primary Care Provider: Eliezer Lofts, MD [place] [address]  Chief Complaint: Follow-up syncope  HPI:  Todd Owens is a 51 y.o. year-old male with history of hypertension, hyperlipidemia, and morbid obesity, who presents for follow-up of syncope. I saw him on 08/04/16 following a fainting spell while visiting a friend in the hospital. He reports having had 2 similar episodes in the remote past while having blood drawn and undergoing a nerve conduction velocity study. We agreed to obtain an exercise tolerance test, which showed no evidence of ischemia. Since her last visit, Todd Owens has not had any further fainting episodes. He reports one incident in which he felt a little bit warm and "out of sorts," though this resolved after going outside into the cool air. He has not had any chest pain, shortness of breath, palpitations, lightheadedness, orthopnea, or PND. He has mild lower extremity edema, which is stable. He has returned to work without any issues.  --------------------------------------------------------------------------------------------------  Cardiovascular History & Procedures: Cardiovascular Problems:  Vasovagal syncope  Risk Factors:  Hypertension, hyperlipidemia, obesity, family history, and male gender  Cath/PCI:  None  CV Surgery:  None  EP Procedures and Devices:  None  Non-Invasive Evaluation(s):  Exercise tolerance test (08/10/16): Fair exercise capacity (6 minutes, 48 seconds) achieving 155 bpm (91% MPHR). Hypertensive response to exercise. No significant ST-T segment or T-wave changes. Low risk study.  Transthoracic echocardiogram (07/25/16): Normal LV size with moderate LVH. LVEF 60-65%. Normal diastolic function. Mildly dilated aortic root. Mild left atrial enlargement. Normal RV size and function. No significant valvular abnormalities.  Pharmacologic myocardial perfusion stress test  (04/28/11): Normal study without ischemia or scar. LVEF 54%.  Recent CV Pertinent Labs: Lab Results  Component Value Date   CHOL 127 07/30/2016   HDL 34.10 (L) 07/30/2016   LDLCALC 80 07/30/2016   TRIG 67.0 07/30/2016   CHOLHDL 4 07/30/2016   K 4.1 07/30/2016   MG 2.1 07/25/2016   BUN 13 07/30/2016   CREATININE 1.01 07/30/2016    Past medical and surgical history were reviewed and updated in EPIC.  Outpatient Encounter Prescriptions as of 10/21/2016  Medication Sig  . aspirin 81 MG EC tablet TAKE 1 TABLET BY MOUTH EVERY DAY  . clindamycin (CLINDAGEL) 1 % gel Apply topically 2 (two) times daily. (Patient taking differently: Apply 1 application topically 2 (two) times daily as needed (adult acne on nose). )  . losartan (COZAAR) 100 MG tablet TAKE 1 TABLET (100 MG TOTAL) BY MOUTH DAILY.  . nitroGLYCERIN (NITROSTAT) 0.4 MG SL tablet Place 0.4 mg under the tongue as needed.  Marland Kitchen omeprazole (PRILOSEC) 40 MG capsule Take 1 capsule (40 mg total) by mouth daily.  Marland Kitchen PRESCRIPTION MEDICATION Inhale into the lungs at bedtime. CPAP  . simvastatin (ZOCOR) 40 MG tablet Take 1 tablet (40 mg total) by mouth daily.  Marland Kitchen triamterene-hydrochlorothiazide (MAXZIDE-25) 37.5-25 MG tablet TAKE 1 TABLET BY MOUTH ONCE DAILY   No facility-administered encounter medications on file as of 10/21/2016.     Allergies: Patient has no known allergies.  Social History   Social History  . Marital status: Married    Spouse name: N/A  . Number of children: N/A  . Years of education: N/A   Occupational History  . Not on file.   Social History Main Topics  . Smoking status: Former Smoker    Packs/day: 1.00    Years: 15.00    Quit date: 1995  .  Smokeless tobacco: Never Used  . Alcohol use No  . Drug use: No  . Sexual activity: Yes   Other Topics Concern  . Not on file   Social History Narrative  . No narrative on file    Family History  Problem Relation Age of Onset  . Hypertension Mother   . Heart  disease Mother     pacemaker  . Hypertension Father   . Stroke Father   . Heart disease Father 17  . Heart attack Father   . Heart disease Sister   . Heart attack Sister 57  . Heart failure Sister     Review of Systems: Intermittent headaches, Which have been a chronic problem. Otherwise, a 12-system review of systems was performed and was negative except as noted in the HPI.  --------------------------------------------------------------------------------------------------  Physical Exam: BP 118/72 (BP Location: Left Arm, Patient Position: Sitting, Cuff Size: Normal)   Pulse 64   Ht 5\' 9"  (1.753 m)   Wt (!) 332 lb 8 oz (150.8 kg)   SpO2 94%   BMI 49.10 kg/m   General:  Morbidly obese man, seated comfortably in the exam room. HEENT: No conjunctival pallor or scleral icterus.  Moist mucous membranes.  OP clear. Neck: Supple without lymphadenopathy, thyromegaly, JVD, or HJR. Lungs: Normal work of breathing.  Clear to auscultation bilaterally without wheezes or crackles. Heart: Regular rate and rhythm without murmurs, rubs, or gallops.  Non-displaced PMI. Abd: Bowel sounds present.  Soft, NT/ND without hepatosplenomegaly Ext: 1+ pretibial edema bilaterally.  Radial, PT, and DP pulses are 2+ bilaterally. Skin: warm and dry without rash  Lab Results  Component Value Date   WBC 5.9 07/30/2016   HGB 14.8 07/30/2016   HCT 42.7 07/30/2016   MCV 96.2 07/30/2016   PLT 277.0 07/30/2016    Lab Results  Component Value Date   NA 138 07/30/2016   K 4.1 07/30/2016   CL 100 07/30/2016   CO2 32 07/30/2016   BUN 13 07/30/2016   CREATININE 1.01 07/30/2016   GLUCOSE 94 07/30/2016   ALT 29 07/25/2016    Lab Results  Component Value Date   CHOL 127 07/30/2016   HDL 34.10 (L) 07/30/2016   LDLCALC 80 07/30/2016   TRIG 67.0 07/30/2016   CHOLHDL 4 07/30/2016    --------------------------------------------------------------------------------------------------  ASSESSMENT AND  PLAN: Vasovagal syncope No further episodes of syncope. Patient is aware of potential triggers and warning signs of vasovagal episodes. I encouraged him to stay well-hydrated and to consider wearing compression stockings to help improve venous return. No further cardiac workup at this time unless he were to have recurrent episodes and or palpitations.  Hypertension Blood pressure well controlled today, though hypertensive response noted on recent stress test. Encouraged patient to exercise and lose weight. We will not make any medication changes today.  Follow-up: Return to clinic in 6 months.  Nelva Bush, MD 10/21/2016 1:43 PM

## 2016-10-21 NOTE — Patient Instructions (Signed)
Medication Instructions:  Your physician recommends that you continue on your current medications as directed. Please refer to the Current Medication list given to you today.   Labwork: none  Testing/Procedures: none  Follow-Up: Your physician wants you to follow-up in: 6 MONTHS WITH DR END. You will receive a reminder letter in the mail two months in advance. If you don't receive a letter, please call our office to schedule the follow-up appointment.   Any Other Special Instructions Will Be Listed Below (If Applicable).  Your physician recommends you purchase compression stockings to wear. These can be purchased over the counter.  Please contact our office if you have symptoms of lightheadedness or palpitations before it is time for your next appointment.  You will be provided information on the DASH diet.   DASH Eating Plan DASH stands for "Dietary Approaches to Stop Hypertension." The DASH eating plan is a healthy eating plan that has been shown to reduce high blood pressure (hypertension). It may also reduce your risk for type 2 diabetes, heart disease, and stroke. The DASH eating plan may also help with weight loss. What are tips for following this plan? General guidelines   Avoid eating more than 2,300 mg (milligrams) of salt (sodium) a day. If you have hypertension, you may need to reduce your sodium intake to 1,500 mg a day.  Limit alcohol intake to no more than 1 drink a day for nonpregnant women and 2 drinks a day for men. One drink equals 12 oz of beer, 5 oz of wine, or 1 oz of hard liquor.  Work with your health care provider to maintain a healthy body weight or to lose weight. Ask what an ideal weight is for you.  Get at least 30 minutes of exercise that causes your heart to beat faster (aerobic exercise) most days of the week. Activities may include walking, swimming, or biking.  Work with your health care provider or diet and nutrition specialist (dietitian) to  adjust your eating plan to your individual calorie needs. Reading food labels   Check food labels for the amount of sodium per serving. Choose foods with less than 5 percent of the Daily Value of sodium. Generally, foods with less than 300 mg of sodium per serving fit into this eating plan.  To find whole grains, look for the word "whole" as the first word in the ingredient list. Shopping   Buy products labeled as "low-sodium" or "no salt added."  Buy fresh foods. Avoid canned foods and premade or frozen meals. Cooking   Avoid adding salt when cooking. Use salt-free seasonings or herbs instead of table salt or sea salt. Check with your health care provider or pharmacist before using salt substitutes.  Do not fry foods. Cook foods using healthy methods such as baking, boiling, grilling, and broiling instead.  Cook with heart-healthy oils, such as olive, canola, soybean, or sunflower oil. Meal planning    Eat a balanced diet that includes:  5 or more servings of fruits and vegetables each day. At each meal, try to fill half of your plate with fruits and vegetables.  Up to 6-8 servings of whole grains each day.  Less than 6 oz of lean meat, poultry, or fish each day. A 3-oz serving of meat is about the same size as a deck of cards. One egg equals 1 oz.  2 servings of low-fat dairy each day.  A serving of nuts, seeds, or beans 5 times each week.  Heart-healthy fats. Healthy  fats called Omega-3 fatty acids are found in foods such as flaxseeds and coldwater fish, like sardines, salmon, and mackerel.  Limit how much you eat of the following:  Canned or prepackaged foods.  Food that is high in trans fat, such as fried foods.  Food that is high in saturated fat, such as fatty meat.  Sweets, desserts, sugary drinks, and other foods with added sugar.  Full-fat dairy products.  Do not salt foods before eating.  Try to eat at least 2 vegetarian meals each week.  Eat more  home-cooked food and less restaurant, buffet, and fast food.  When eating at a restaurant, ask that your food be prepared with less salt or no salt, if possible. What foods are recommended? The items listed may not be a complete list. Talk with your dietitian about what dietary choices are best for you. Grains  Whole-grain or whole-wheat bread. Whole-grain or whole-wheat pasta. Brown rice. [place] Morrow. Bulgur. Whole-grain and low-sodium cereals. Pita bread. Low-fat, low-sodium crackers. Whole-wheat flour tortillas. Vegetables  Fresh or frozen vegetables (raw, steamed, roasted, or grilled). Low-sodium or reduced-sodium tomato and vegetable juice. Low-sodium or reduced-sodium tomato sauce and tomato paste. Low-sodium or reduced-sodium canned vegetables. Fruits  All fresh, dried, or frozen fruit. Canned fruit in natural juice (without added sugar). Meat and other protein foods  Skinless chicken or [place]. Ground chicken or [place]. Pork with fat trimmed off. Fish and seafood. Egg whites. Dried beans, peas, or lentils. Unsalted nuts, nut butters, and seeds. Unsalted canned beans. Lean cuts of beef with fat trimmed off. Low-sodium, lean deli meat. Dairy  Low-fat (1%) or fat-free (skim) milk. Fat-free, low-fat, or reduced-fat cheeses. Nonfat, low-sodium ricotta or cottage cheese. Low-fat or nonfat yogurt. Low-fat, low-sodium cheese. Fats and oils  Soft margarine without trans fats. Vegetable oil. Low-fat, reduced-fat, or light mayonnaise and salad dressings (reduced-sodium). Canola, safflower, olive, soybean, and sunflower oils. Avocado. Seasoning and other foods  Herbs. Spices. Seasoning mixes without salt. Unsalted popcorn and pretzels. Fat-free sweets. What foods are not recommended? The items listed may not be a complete list. Talk with your dietitian about what dietary choices are best for you. Grains  Baked goods made with fat, such as croissants, muffins, or some breads. Dry pasta or rice  meal packs. Vegetables  Creamed or fried vegetables. Vegetables in a cheese sauce. Regular canned vegetables (not low-sodium or reduced-sodium). Regular canned tomato sauce and paste (not low-sodium or reduced-sodium). Regular tomato and vegetable juice (not low-sodium or reduced-sodium). Angie Fava. Olives. Fruits  Canned fruit in a light or heavy syrup. Fried fruit. Fruit in cream or butter sauce. Meat and other protein foods  Fatty cuts of meat. Ribs. Fried meat. Berniece Salines. Sausage. Bologna and other processed lunch meats. Salami. Fatback. Hotdogs. Bratwurst. Salted nuts and seeds. Canned beans with added salt. Canned or smoked fish. Whole eggs or egg yolks. Chicken or [place] with skin. Dairy  Whole or 2% milk, cream, and half-and-half. Whole or full-fat cream cheese. Whole-fat or sweetened yogurt. Full-fat cheese. Nondairy creamers. Whipped toppings. Processed cheese and cheese spreads. Fats and oils  Butter. Stick margarine. Lard. Shortening. Ghee. Bacon fat. Tropical oils, such as coconut, palm kernel, or palm oil. Seasoning and other foods  Salted popcorn and pretzels. Onion salt, garlic salt, seasoned salt, table salt, and sea salt. Worcestershire sauce. Tartar sauce. Barbecue sauce. Teriyaki sauce. Soy sauce, including reduced-sodium. Steak sauce. Canned and packaged gravies. Fish sauce. Oyster sauce. Cocktail sauce. Horseradish that you find on the shelf. Ketchup. Mustard.  Meat flavorings and tenderizers. Bouillon cubes. Hot sauce and Tabasco sauce. Premade or packaged marinades. Premade or packaged taco seasonings. Relishes. Regular salad dressings. Where to find more information:  National Heart, Lung, and [place]: https://wilson-eaton.com/  American Heart Association: www.heart.org Summary  The DASH eating plan is a healthy eating plan that has been shown to reduce high blood pressure (hypertension). It may also reduce your risk for type 2 diabetes, heart disease, and stroke.  With the  DASH eating plan, you should limit salt (sodium) intake to 2,300 mg a day. If you have hypertension, you may need to reduce your sodium intake to 1,500 mg a day.  When on the DASH eating plan, aim to eat more fresh fruits and vegetables, whole grains, lean proteins, low-fat dairy, and heart-healthy fats.  Work with your health care provider or diet and nutrition specialist (dietitian) to adjust your eating plan to your individual calorie needs. This information is not intended to replace advice given to you by your health care provider. Make sure you discuss any questions you have with your health care provider. Document Released: 06/25/2011 Document Revised: 06/29/2016 Document Reviewed: 06/29/2016 Elsevier Interactive Patient Education  2017 [employer].  If you need a refill on your cardiac medications before your next appointment, please call your pharmacy.

## 2016-12-31 ENCOUNTER — Encounter: Payer: Self-pay | Admitting: Family Medicine

## 2016-12-31 ENCOUNTER — Ambulatory Visit (INDEPENDENT_AMBULATORY_CARE_PROVIDER_SITE_OTHER): Payer: BC Managed Care – PPO | Admitting: Family Medicine

## 2016-12-31 DIAGNOSIS — M654 Radial styloid tenosynovitis [de Quervain]: Secondary | ICD-10-CM | POA: Diagnosis not present

## 2016-12-31 MED ORDER — DICLOFENAC SODIUM 75 MG PO TBEC: 75.0000 mg | DELAYED_RELEASE_TABLET | Freq: Two times a day (BID) | ORAL | 0 refills | Status: DC

## 2016-12-31 NOTE — Patient Instructions (Addendum)
Start home stretched.  Start diclofenac twice daily x 1-2 weeks.  Stop ibuprofen. Wear thumb spica splint when using arm and at night.  Follow up if not improving in 2 week.

## 2016-12-31 NOTE — Assessment & Plan Note (Signed)
Treat with short term NSAIDs given CAD risk.  Wear splint on left wrist.. Thumb spica.  Start home PT.

## 2016-12-31 NOTE — Progress Notes (Signed)
   Subjective:    Patient ID: Todd Owens, male    DOB: Jan 24, 1966, 51 y.o.   MRN: [id number]  HPI    51 year old male presents for new onset leftt wrist pain x 1 month. No initial fall, no injury.  Awoke suddenly with pain in wrist on radial side. Pain increases with turning wrist.  No redness, no swelling, no numbness, no weakness.   He has treated with ice and heat. He has used ibuprofen and tylenol for pain.  Minimal relief.  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     Father with gout.  Review of Systems  Constitutional: Negative for fatigue.  HENT: Negative for ear pain.   Eyes: Negative for pain.  Respiratory: Negative for shortness of breath.   Cardiovascular: Negative for chest pain and leg swelling.  Gastrointestinal: Negative for abdominal distention.       Objective:   Physical Exam  Constitutional: Vital signs are normal. He appears well-developed and well-nourished.  Obesity   HENT:  Head: Normocephalic.  Right Ear: Hearing normal.  Left Ear: Hearing normal.  Nose: Nose normal.  Mouth/Throat: Oropharynx is clear and moist and mucous membranes are normal.  Neck: Trachea normal. Carotid bruit is not present. No thyroid mass and no thyromegaly present.  Cardiovascular: Normal rate, regular rhythm and normal pulses.  Exam reveals no gallop, no distant heart sounds and no friction rub.   No murmur heard. No peripheral edema  Pulmonary/Chest: Effort normal and breath sounds normal. No respiratory distress.  Musculoskeletal:       Left wrist: He exhibits tenderness and bony tenderness. He exhibits normal range of motion, no swelling and no effusion.  No erythema, no pain in MCP joint, pain voeraradial side of wrist, positive finkelstein test.  Skin: Skin is warm, dry and intact. No rash noted.  Psychiatric: He has a normal mood and affect. His speech is normal and behavior is normal. Thought content normal.           Assessment & Plan:

## 2017-01-23 ENCOUNTER — Other Ambulatory Visit: Payer: Self-pay | Admitting: Family Medicine

## 2017-02-15 ENCOUNTER — Telehealth: Payer: Self-pay | Admitting: Pulmonary Disease

## 2017-02-15 DIAGNOSIS — G4733 Obstructive sleep apnea (adult) (pediatric): Secondary | ICD-10-CM

## 2017-02-15 NOTE — Telephone Encounter (Signed)
Called and spoke to pt's wife, Estill Bamberg. Pt is needing CPAP supplies and OV with RA. Appt made for pt with RA on 05/20/2017 and order placed for CPAP supplies under TP. Estill Bamberg verbalized understanding and denied any further questions or concerns at this time.

## 2017-03-30 ENCOUNTER — Encounter: Payer: Self-pay | Admitting: Family Medicine

## 2017-03-30 ENCOUNTER — Ambulatory Visit (INDEPENDENT_AMBULATORY_CARE_PROVIDER_SITE_OTHER): Payer: BC Managed Care – PPO | Admitting: Family Medicine

## 2017-03-30 VITALS — BP 104/60 | HR 61 | Temp 98.3°F | Ht 69.0 in | Wt 331.8 lb

## 2017-03-30 DIAGNOSIS — M654 Radial styloid tenosynovitis [de Quervain]: Secondary | ICD-10-CM

## 2017-03-30 DIAGNOSIS — M722 Plantar fascial fibromatosis: Secondary | ICD-10-CM | POA: Diagnosis not present

## 2017-03-30 MED ORDER — NAPROXEN 500 MG PO TBEC: 500.0000 mg | DELAYED_RELEASE_TABLET | Freq: Two times a day (BID) | ORAL | 0 refills | Status: DC

## 2017-03-30 NOTE — Assessment & Plan Note (Signed)
Continue thumb spica and home PT. Refer to sports med for further eval and treatment with possible steroid injection given no better with NSAIDs and splinting.

## 2017-03-30 NOTE — Assessment & Plan Note (Signed)
Start ice massage, NSAIDS and home PT.

## 2017-03-30 NOTE — Patient Instructions (Addendum)
For foot: start antiinflammatory twice daily, home PT and ice massage. For  Wrist: stop at front desk to make appt with Dr. Lorelei Pont for sports med referral.  Plantar Fasciitis Plantar fasciitis is a painful foot condition that affects the heel. It occurs when the band of tissue that connects the toes to the heel bone (plantar fascia) becomes irritated. This can happen after exercising too much or doing other repetitive activities (overuse injury). The pain from plantar fasciitis can range from mild irritation to severe pain that makes it difficult for you to walk or move. The pain is usually worse in the morning or after you have been sitting or lying down for a while. What are the causes? This condition may be caused by:  Standing for long periods of time.  Wearing shoes that do not fit.  Doing high-impact activities, including running, aerobics, and ballet.  Being overweight.  Having an abnormal way of walking (gait).  Having tight calf muscles.  Having high arches in your feet.  Starting a new athletic activity.  What are the signs or symptoms? The main symptom of this condition is heel pain. Other symptoms include:  Pain that gets worse after activity or exercise.  Pain that is worse in the morning or after resting.  Pain that goes away after you walk for a few minutes.  How is this diagnosed? This condition may be diagnosed based on your signs and symptoms. Your health care provider will also do a physical exam to check for:  A tender area on the bottom of your foot.  A high arch in your foot.  Pain when you move your foot.  Difficulty moving your foot.  You may also need to have imaging studies to confirm the diagnosis. These can include:  X-rays.  Ultrasound.  MRI.  How is this treated? Treatment for plantar fasciitis depends on the severity of the condition. Your treatment may include:  Rest, ice, and over-the-counter pain medicines to manage your  pain.  Exercises to stretch your calves and your plantar fascia.  A splint that holds your foot in a stretched, upward position while you sleep (night splint).  Physical therapy to relieve symptoms and prevent problems in the future.  Cortisone injections to relieve severe pain.  Extracorporeal shock wave therapy (ESWT) to stimulate damaged plantar fascia with electrical impulses. It is often used as a last resort before surgery.  Surgery, if other treatments have not worked after 12 months.  Follow these instructions at home:  Take medicines only as directed by your health care provider.  Avoid activities that cause pain.  Roll the bottom of your foot over a bag of ice or a bottle of cold water. Do this for 20 minutes, 3-4 times a day.  Perform simple stretches as directed by your health care provider.  Try wearing athletic shoes with air-sole or gel-sole cushions or soft shoe inserts.  Wear a night splint while sleeping, if directed by your health care provider.  Keep all follow-up appointments with your health care provider. How is this prevented?  Do not perform exercises or activities that cause heel pain.  Consider finding low-impact activities if you continue to have problems.  Lose weight if you need to. The best way to prevent plantar fasciitis is to avoid the activities that aggravate your plantar fascia. Contact a health care provider if:  Your symptoms do not go away after treatment with home care measures.  Your pain gets worse.  Your pain  affects your ability to move or do your daily activities. This information is not intended to replace advice given to you by your health care provider. Make sure you discuss any questions you have with your health care provider. Document Released: 03/31/2001 Document Revised: 12/09/2015 Document Reviewed: 05/16/2014 Elsevier Interactive Patient Education  [employer].

## 2017-03-30 NOTE — Progress Notes (Signed)
   Subjective:    Patient ID: Todd Owens, male    DOB: 11-Jun-1966, 51 y.o.   MRN: [id number]  HPI   51 year old male morbidly obese with history of HTN, testosterone def presents with  Continued left wrist pain and new left foot pain.   He was seen in 12/2016 and Dx with DeQuervain's tenosynovitis.. Treated with NSAIDs (diclofenac), home PT and thumb spica splint. Med did not help at all but brace helps a lot He cannot wear splint at work , so wears during the day.  Pain in  radial aspect of wrist.  No numbness, no weakness, no redness , no swelling.   left foot pain in left posterior heel: started in last 1.5 month.  Pain off and on. Worse with driving, weight on it. Pain after sitting with standing up and putting on pressure. Wears supportive boots with pad.  No numbness, no redness, no swelling. He is active and works as Airline pilot. Has not taken any pain med.   Review of Systems  Constitutional: Negative for fatigue and fever.  HENT: Negative for ear pain.   Eyes: Negative for pain.  Respiratory: Negative for cough and shortness of breath.   Cardiovascular: Negative for chest pain, palpitations and leg swelling.  Gastrointestinal: Negative for abdominal pain.  Genitourinary: Negative for dysuria.  Musculoskeletal: Negative for arthralgias.  Neurological: Negative for syncope, light-headedness and headaches.  Psychiatric/Behavioral: Negative for dysphoric mood.       Objective:   Physical Exam  Constitutional: Vital signs are normal. He appears well-developed and well-nourished.  Obesity   HENT:  Head: Normocephalic.  Right Ear: Hearing normal.  Left Ear: Hearing normal.  Nose: Nose normal.  Mouth/Throat: Oropharynx is clear and moist and mucous membranes are normal.  Neck: Trachea normal. Carotid bruit is not present. No thyroid mass and no thyromegaly present.  Cardiovascular: Normal rate, regular rhythm and normal pulses.  Exam reveals no gallop, no distant  heart sounds and no friction rub.   No murmur heard. No peripheral edema  Pulmonary/Chest: Effort normal and breath sounds normal. No respiratory distress.  Musculoskeletal:       Left wrist: He exhibits tenderness and bony tenderness. He exhibits normal range of motion, no swelling and no effusion.       Left ankle: Normal. He exhibits normal range of motion. No tenderness.       Left foot: There is tenderness and bony tenderness. There is normal range of motion.  No erythema, no pain in MCP joint, pain voeraradial side of wrist, positive finkelstein test.  No redness, no swelling in hands or feet  Neg tinel and phalen on left upper ext  Left lower ext.. ttp over insertion of plantar fascia  Skin: Skin is warm, dry and intact. No rash noted.  Psychiatric: He has a normal mood and affect. His speech is normal and behavior is normal. Thought content normal.          Assessment & Plan:

## 2017-04-02 ENCOUNTER — Ambulatory Visit: Payer: Self-pay

## 2017-04-02 ENCOUNTER — Encounter: Payer: Self-pay | Admitting: Sports Medicine

## 2017-04-02 ENCOUNTER — Ambulatory Visit (INDEPENDENT_AMBULATORY_CARE_PROVIDER_SITE_OTHER): Payer: BC Managed Care – PPO | Admitting: Sports Medicine

## 2017-04-02 VITALS — BP 122/84 | HR 63 | Ht 69.0 in | Wt 335.2 lb

## 2017-04-02 DIAGNOSIS — M654 Radial styloid tenosynovitis [de Quervain]: Secondary | ICD-10-CM | POA: Diagnosis not present

## 2017-04-02 MED ORDER — DICLOFENAC SODIUM 2 % TD SOLN: 1.0000 "application " | Freq: Two times a day (BID) | TRANSDERMAL | 0 refills | Status: AC

## 2017-04-02 MED ORDER — DICLOFENAC SODIUM 2 % TD SOLN: 1.0000 "application " | Freq: Two times a day (BID) | TRANSDERMAL | 2 refills | Status: DC

## 2017-04-02 NOTE — Progress Notes (Signed)
OFFICE VISIT NOTE Todd Owens. Todd Owens, [name] - 51 y.o. male MRN [id number]  Date of birth: 18-Sep-1965  Visit Date: 04/02/2017  PCP: Jinny Sanders, MD   Referred by: Jinny Sanders, MD  Burlene Arnt, CMA acting as scribe for Dr. Paulla Fore.  SUBJECTIVE:   Chief Complaint  Patient presents with  . New Patient (Initial Visit)    LT wrist/thumb pain   HPI: As below and per problem based documentation when appropriate.  [name] is a new patient presenting today for evaluation of left wrist/thumb pain Pain started about 1 month ago.  No known injury or trauma to the wrist.   The pain is described as aching with occasional sharp pain. Pain is rated as 4/23 with certain movements.  Worsened with certain movements like scratching his neck.  Improves with rest, thumb spica Therapies tried include : Naproxen, Voltaren, thumb spica.   Other associated symptoms include: No radiation of pain into the arm.     Review of Systems  Constitutional: Negative for chills and fever.  Respiratory: Negative for shortness of breath and wheezing.   Cardiovascular: Negative for chest pain and palpitations.  Musculoskeletal: Positive for joint pain. Negative for falls.  Neurological: Negative for dizziness, tingling and headaches.  Endo/Heme/Allergies: Does not bruise/bleed easily.    Otherwise per HPI.  HISTORY & PERTINENT PRIOR DATA:  No specialty comments available. He reports that he quit smoking about 23 years ago. He has a 15.00 pack-year smoking history. He has never used smokeless tobacco.   Recent Labs  07/25/16 0754  HGBA1C 5.6   Medications & Allergies reviewed per EMR Patient Active Problem List   Diagnosis Date Noted  . Plantar fasciitis of left foot 03/30/2017  . De Quervain's tenosynovitis, left 12/31/2016  . Vasovagal syncope 10/21/2016  . Acute bronchitis 04/27/2016  . Cutaneous skin  tags 03/27/2016  . Left LBP 10/04/2015  . Family history of premature CAD 10/09/2014  . Prediabetes 05/26/2013  . Metabolic syndrome 53/61/4431  . Hyperprolactinemia ([place]) 09/26/2010  . Testosterone deficiency 09/17/2009  . 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 ONYCHOMYCOSIS, BILATERAL 09/14/2008  . GERD 09/14/2008  . Obstructive sleep apnea 11/25/2007  . Essential hypertension, benign 11/14/2007  . MORBID OBESITY 10/07/2007  . ACNE, ROSACEA 10/07/2007  . Hyperlipidemia 10/03/2007   Past Medical History:  Diagnosis Date  . Acute sinusitis, unspecified   . Dermatophytosis of nail   . Esophageal reflux   . Essential hypertension, benign   . Impotence of organic origin   . Morbid obesity ([place])   . Obstructive sleep apnea (adult) (pediatric)   . Other and unspecified hyperlipidemia   . Other malaise and fatigue   . Other testicular hypofunction   . Pure hypercholesterolemia   . Rosacea   . Routine general medical examination at a health care facility    Family History  Problem Relation Age of Onset  . Hypertension Mother   . Heart disease Mother        pacemaker  . Hypertension Father   . Stroke Father   . Heart disease Father 57  . Heart attack Father   . Heart disease Sister   . Heart attack Sister 60  . Heart failure Sister    Past Surgical History:  Procedure Laterality Date  . BACK SURGERY  2003  . cardiolyte  2001   neg  . KNEE SURGERY  2005  .  PFT  2007   WNL  . WRIST SURGERY Right    Social History   Occupational History  . Not on file.   Social History Main Topics  . Smoking status: Former Smoker    Packs/day: 1.00    Years: 15.00    Quit date: 1995  . Smokeless tobacco: Never Used  . Alcohol use No  . Drug use: No  . Sexual activity: Yes    OBJECTIVE:  VS:  HT:5\' 9"  (175.3 cm)   WT:(!) 335 lb 3.2 oz (152 kg)  BMI:49.48    BP:122/84  HR:63bpm  TEMP: ( )  RESP:96 % EXAM: Findings:  WDWN, NAD, Non-toxic appearing Alert &  appropriately interactive Not depressed or anxious appearing No increased work of breathing. Pupils are equal. EOM intact without nystagmus No clubbing or cyanosis of the extremities appreciated No significant rashes/lesions/ulcerations overlying the examined area. Radial pulses 2+/4.  No significant generalized UE edema. Sensation intact to light touch in upper extremities.  Left hand:  Overall well aligned with slight bossing of the hands and fingers but mild.  Degenerative changes are mild.  Marked pain with Wynn Maudlin testing.   Pain present with resisted thumb extension.      ASSESSMENT & PLAN:     ICD-10-CM   1. De Quervain's tenosynovitis, left M65.4 [place] LIMITED JOINT SPACE STRUCTURES UP LEFT(NO LINKED CHARGES)   =================================================================  PROCEDURE NOTE -  ULTRASOUND GUIDEDINJECTION: Left 1st Dorsal Compartment Injection Images were obtained and interpreted by myself, Teresa Coombs, DO  Images have been saved and stored to PACS system. Images obtained on: GE S7 Ultrasound machine  ULTRASOUND FINDINGS: Stenosis and hypoechoic change of the first dorsal compartment  DESCRIPTION OF PROCEDURE:  The patient's clinical condition is marked by substantial pain and/or significant functional disability. Other conservative therapy has not provided relief, is contraindicated, or not appropriate. There is a reasonable likelihood that injection will significantly improve the patient's pain and/or functional impairment. After discussing the risks, benefits and expected outcomes of the injection and all questions were reviewed and answered, the patient wished to undergo the above named procedure. Verbal consent was obtained. The ultrasound was used to identify the target structure and adjacent neurovascular structures. The skin was then prepped in sterile fashion and the target structure was injected under direct visualization using sterile technique as  below: PREP: Alcohol, Ethel Chloride APPROACH: Direct inplane, single injection, 25g 1.5" needle INJECTATE: 0.5cc 1% lidocaine, 0.5cc 0.5% marcaine, 0.5cc 40mg  DepoMedrol ASPIRATE: N/A DRESSING: Band-Aid  Post procedural instructions including recommending icing and warning signs for infection were reviewed. This procedure was well tolerated and there were no complications.   IMPRESSION: Succesful US Guided Injection    =================================================================  Follow-up: Return in about 6 weeks (around 05/14/2017).   CMA/ATC served as Education administrator during this visit. History, Physical, and Plan performed by medical provider. Documentation and orders reviewed and attested to.      Teresa Coombs, [facility] Physician

## 2017-04-02 NOTE — Patient Instructions (Signed)

## 2017-04-18 NOTE — Procedures (Signed)
PROCEDURE NOTE -  ULTRASOUND GUIDEDINJECTION: Left 1st Dorsal Compartment Injection Images were obtained and interpreted by myself, Teresa Coombs, DO  Images have been saved and stored to PACS system. Images obtained on: GE S7 Ultrasound machine  ULTRASOUND FINDINGS: Stenosis and hypoechoic change of the first dorsal compartment  DESCRIPTION OF PROCEDURE:  The patient's clinical condition is marked by substantial pain and/or significant functional disability. Other conservative therapy has not provided relief, is contraindicated, or not appropriate. There is a reasonable likelihood that injection will significantly improve the patient's pain and/or functional impairment. After discussing the risks, benefits and expected outcomes of the injection and all questions were reviewed and answered, the patient wished to undergo the above named procedure. Verbal consent was obtained. The ultrasound was used to identify the target structure and adjacent neurovascular structures. The skin was then prepped in sterile fashion and the target structure was injected under direct visualization using sterile technique as below: PREP: Alcohol, Ethel Chloride APPROACH: Direct inplane, single injection, 25g 1.5" needle INJECTATE: 0.5cc 1% lidocaine, 0.5cc 0.5% marcaine, 0.5cc 40mg  DepoMedrol ASPIRATE: N/A DRESSING: Band-Aid  Post procedural instructions including recommending icing and warning signs for infection were reviewed. This procedure was well tolerated and there were no complications.   IMPRESSION: Succesful US Guided Injection

## 2017-05-20 ENCOUNTER — Encounter: Payer: Self-pay | Admitting: Pulmonary Disease

## 2017-05-20 ENCOUNTER — Ambulatory Visit (INDEPENDENT_AMBULATORY_CARE_PROVIDER_SITE_OTHER): Payer: BC Managed Care – PPO | Admitting: Pulmonary Disease

## 2017-05-20 DIAGNOSIS — G4733 Obstructive sleep apnea (adult) (pediatric): Secondary | ICD-10-CM | POA: Diagnosis not present

## 2017-05-20 NOTE — Progress Notes (Signed)
   Subjective:    Patient ID: Todd Owens, male    DOB: Sep 18, 1965, 51 y.o.   MRN: [id number]  HPI  51 year old male for FU of moderate to severe sleep apnea -on autoCPAP 11-15 cm  he is a driver of heavy equipment   He has done well on CPAP machine, uses nasal pillows and is tolerating this well.  He occasionally has burning on the outside of his nostrils. No problems with pressure or pillows. Bedtime is around 10:30 PM, for the last month or 2 he has been waking up around 1 AM and then is unable to fall asleep for 30 minutes.  Denies stressors or previous history of insomnia. Getting his supplies on time now after some initial pickups.  Download was reviewed which shows average pressure of 14 cm with good control of events on auto settings and minimal leak.  Compliance is excellent 7 hours per night  His weight remains high, blood pressure is controlled    Significant tests/ events reviewed  NPSG 05/2016 >AHI 29/hr .   Review of Systems neg for any significant sore throat, dysphagia, itching, sneezing, nasal congestion or excess/ purulent secretions, fever, chills, sweats, unintended wt loss, pleuritic or exertional cp, hempoptysis, orthopnea pnd or change in chronic leg swelling. Also denies presyncope, palpitations, heartburn, abdominal pain, nausea, vomiting, diarrhea or change in bowel or urinary habits, dysuria,hematuria, rash, arthralgias, visual complaints, headache, numbness weakness or ataxia.      Objective:   Physical Exam   Gen. Pleasant, obese, in no distress ENT - class 2 airway, no post nasal drip Neck: No JVD, no thyromegaly, no carotid bruits Lungs: no use of accessory muscles, no dullness to percussion, decreased without rales or rhonchi  Cardiovascular: Rhythm regular, heart sounds  normal, no murmurs or gallops, no peripheral edema Musculoskeletal: No deformities, no cyanosis or clubbing , no tremors         Assessment & Plan:

## 2017-05-20 NOTE — Assessment & Plan Note (Signed)
Weight loss encouraged 

## 2017-05-20 NOTE — Patient Instructions (Addendum)
You are on autoCPAP settings- avg pr is 13 cm CPAP is working well

## 2017-05-20 NOTE — Assessment & Plan Note (Signed)
You are on autoCPAP settings- avg pr is 13 cm CPAP is working well  Weight loss encouraged, compliance with goal of at least 4-6 hrs every night is the expectation. Advised against medications with sedative side effects Cautioned against driving when sleepy - understanding that sleepiness will vary on a day to day basis

## 2017-06-24 ENCOUNTER — Encounter: Payer: Self-pay | Admitting: Family Medicine

## 2017-06-24 ENCOUNTER — Ambulatory Visit: Payer: BC Managed Care – PPO | Admitting: Primary Care

## 2017-06-24 ENCOUNTER — Ambulatory Visit: Payer: BC Managed Care – PPO | Admitting: Family Medicine

## 2017-06-24 DIAGNOSIS — R059 Cough, unspecified: Secondary | ICD-10-CM

## 2017-06-24 DIAGNOSIS — R05 Cough: Secondary | ICD-10-CM

## 2017-06-24 MED ORDER — ALBUTEROL SULFATE HFA 108 (90 BASE) MCG/ACT IN AERS: 2.0000 | INHALATION_SPRAY | Freq: Four times a day (QID) | RESPIRATORY_TRACT | 0 refills | Status: DC | PRN

## 2017-06-24 MED ORDER — BENZONATATE 200 MG PO CAPS: 200.0000 mg | ORAL_CAPSULE | Freq: Three times a day (TID) | ORAL | 1 refills | Status: DC | PRN

## 2017-06-24 MED ORDER — AMOXICILLIN-POT CLAVULANATE 875-125 MG PO TABS: 1.0000 | ORAL_TABLET | Freq: Two times a day (BID) | ORAL | 0 refills | Status: DC

## 2017-06-24 NOTE — Progress Notes (Signed)
Sx started about 5 days ago.  ST/irritation/dry throat.  Worse in the meantime, more cough.  Diffuse aches.  Stayed at home yesterday.  Working for [employer].  He was out of work today and yesterday.  His grandson has been sick with a cold.  No fevers but had some chills yesterday.  He feels a little better today compared to yesterday but still with chest irritation and frontal sinus pressure.  Taking robitussin with honey.  No vomiting, no diarrhea, no rash.  Some sputum, discolored.    Meds, vitals, and allergies reviewed.   ROS: Per HPI unless specifically indicated in ROS section   nad ncat TM wnl Frontal sinuses ttp B.  Max sinuses not ttp Nasal exam stuffy OP with mild cobblestoning Neck supple, no LA rrr Diffuse mild exp wheeze noted.  No focal dec in BS. No inc in WOB abd soft Ext w/o edema.  Skin well perfused.

## 2017-06-24 NOTE — Patient Instructions (Signed)
Use the inhaler for the wheeze and cough.  If still needed, then use tessalon for cough.  If not better in a few days, then start the antibiotics.  Gargle with salt water, rest and fluids.  Update [place] as needed.  Take care.  Glad to see you.

## 2017-06-25 NOTE — Assessment & Plan Note (Signed)
Likely viral, nontoxic.   Use SABA for the wheeze and cough.  If still needed, then use tessalon for cough.  If not better in a few days, then start augmentin, given the sinus pressure and timeline of sx.  Gargle with salt water, rest and fluids.  Update [place] as needed.  Okay for outpatient f/u.  He agrees.

## 2017-07-22 ENCOUNTER — Other Ambulatory Visit: Payer: Self-pay | Admitting: Family Medicine

## 2017-08-07 ENCOUNTER — Other Ambulatory Visit: Payer: Self-pay | Admitting: Family Medicine

## 2017-08-09 ENCOUNTER — Encounter: Payer: Self-pay | Admitting: *Deleted

## 2017-09-06 ENCOUNTER — Encounter: Payer: Self-pay | Admitting: Family Medicine

## 2017-09-06 ENCOUNTER — Ambulatory Visit: Payer: BC Managed Care – PPO | Admitting: Family Medicine

## 2017-09-06 ENCOUNTER — Other Ambulatory Visit: Payer: Self-pay

## 2017-09-06 VITALS — BP 110/60 | HR 64 | Temp 99.0°F | Ht 69.0 in | Wt 340.5 lb

## 2017-09-06 DIAGNOSIS — S39011A Strain of muscle, fascia and tendon of abdomen, initial encounter: Secondary | ICD-10-CM

## 2017-09-06 DIAGNOSIS — S76011A Strain of muscle, fascia and tendon of right hip, initial encounter: Secondary | ICD-10-CM

## 2017-09-06 MED ORDER — DICLOFENAC SODIUM 75 MG PO TBEC: 75.0000 mg | DELAYED_RELEASE_TABLET | Freq: Two times a day (BID) | ORAL | 2 refills | Status: AC

## 2017-09-06 NOTE — Progress Notes (Signed)
Dr. Frederico Hamman T. Carson Bogden, MD, [facility] [place], [address] Phone: [phone] Fax: [phone]  09/06/2017  Patient: Todd Owens, MRN: [id number], DOB: 1966/03/22, 52 y.o.  Primary Physician:  Jinny Sanders, MD   Chief Complaint  Patient presents with  . Abdominal Pain    Lower right side-Slipped on Rug yesterday   Subjective:   Todd Owens is a 52 y.o. very pleasant male patient who presents with the following:  DOI: 09/05/2017  Yesterday, leaving the var, wife laid a doormat, legs went the other way. Can now feel a knot on that side.  He slipped on a rug that was out in his yard, and his legs went out in opposite directions.  He subsequently developed pain on his lower right side, particular the anterior aspect of the lower abdomen and upper leg.  He is also morbidly obese. Body mass index is 50.28 kg/m.   He has had 2 prior surgeries in his lower abdomen, he had as a child.  He thinks these may have been hernia repairs, but is not entirely sure.  Past Medical History, Surgical History, Social History, Family History, Problem List, Medications, and Allergies have been reviewed and updated if relevant.  Patient Active Problem List   Diagnosis Date Noted  . Plantar fasciitis of left foot 03/30/2017  . De Quervain's tenosynovitis, left 12/31/2016  . Vasovagal syncope 10/21/2016  . Cutaneous skin tags 03/27/2016  . Left LBP 10/04/2015  . Family history of premature CAD 10/09/2014  . Prediabetes 05/26/2013  . Metabolic syndrome 69/48/5462  . Cough 10/16/2010  . Hyperprolactinemia ([place]) 09/26/2010  . Testosterone deficiency 09/17/2009  . 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 ONYCHOMYCOSIS, BILATERAL 09/14/2008  . GERD 09/14/2008  . Obstructive sleep apnea 11/25/2007  . Essential hypertension, benign 11/14/2007  . MORBID OBESITY 10/07/2007  . ACNE, ROSACEA 10/07/2007  . Hyperlipidemia 10/03/2007     Past Medical History:  Diagnosis Date  . Acute sinusitis, unspecified   . Dermatophytosis of nail   . Esophageal reflux   . Essential hypertension, benign   . Impotence of organic origin   . Morbid obesity ([place])   . Obstructive sleep apnea (adult) (pediatric)   . Other and unspecified hyperlipidemia   . Other malaise and fatigue   . Other testicular hypofunction   . Pure hypercholesterolemia   . Rosacea   . Routine general medical examination at a health care facility     Past Surgical History:  Procedure Laterality Date  . BACK SURGERY  2003  . cardiolyte  2001   neg  . KNEE SURGERY  2005  . PFT  2007   WNL  . WRIST SURGERY Right     Social History   Socioeconomic History  . Marital status: Married    Spouse name: Not on file  . Number of children: Not on file  . Years of education: Not on file  . Highest education level: Not on file  Social Needs  . Financial resource strain: Not on file  . Food insecurity - worry: Not on file  . Food insecurity - inability: Not on file  . Transportation needs - medical: Not on file  . Transportation needs - non-medical: Not on file  Occupational History  . Not on file  Tobacco Use  . Smoking status: Former Smoker    Packs/day: 1.00    Years: 15.00    Pack years: 15.00  Last attempt to quit: 1995    Years since quitting: 24.1  . Smokeless tobacco: Never Used  Substance and Sexual Activity  . Alcohol use: No  . Drug use: No  . Sexual activity: Yes  Other Topics Concern  . Not on file  Social History Narrative  . Not on file    Family History  Problem Relation Age of Onset  . Hypertension Mother   . Heart disease Mother        pacemaker  . Hypertension Father   . Stroke Father   . Heart disease Father 51  . Heart attack Father   . Heart disease Sister   . Heart attack Sister 80  . Heart failure Sister     No Known Allergies  Medication list reviewed and updated in full in [facility].  GEN:  No fevers, chills. Nontoxic. Primarily MSK c/o today. MSK: Detailed in the HPI GI: tolerating PO intake without difficulty Neuro: No numbness, parasthesias, or tingling associated. Otherwise the pertinent positives of the ROS are noted above.   Objective:   BP 110/60   Pulse 64   Temp 99 F (37.2 C) (Oral)   Ht 5\' 9"  (1.753 m)   Wt (!) 340 lb 8 oz (154.4 kg)   BMI 50.28 kg/m   GEN: WDWN, NAD, Non-toxic, A & O x 3 HEENT: Atraumatic, Normocephalic. Neck supple. No masses, No LAD. Ears and Nose: No external deformity. CV: RRR, No M/G/R. No JVD. No thrill. No extra heart sounds. PULM: CTA B, no wheezes, crackles, rhonchi. No retractions. No resp. distress. No accessory muscle use. ABD: S, NT, ND, +BS. No rebound. No HSM. EXTR: No c/c/e NEURO Normal gait.  PSYCH: Normally interactive. Conversant. Not depressed or anxious appearing.  Calm demeanor.    At the right hip, there is full range of motion.  Barrel roll is normal.  Hip flexion is 3/5.  Hip abduction is 3+/5.  Hip abduction is 4+/5.  GU exam is normal.  There is no appreciable hernia.  There is also no appreciable femoral hernia.  He does have some modest tenderness of the lower aspect of a previous scar.  Radiology: No results found.  Assessment and Plan:   Strain of flexor muscle of right hip, initial encounter  Abdominal muscle strain, initial encounter  Clearly, he has a hip flexor strain secondary to injury described above.  To a lesser degree, he may have a lower abdominal  strain, which could include some disruption of scar tissue.  I do not appreciate any hernia.  He  operates heavy machinery.  I am going to keep him out of work next week until rechecked by my partner.  Ice several times a day.  Follow-up: No Follow-up on file.  Meds ordered this encounter  Medications  . diclofenac (VOLTAREN) 75 MG EC tablet    Sig: Take 1 tablet (75 mg total) by mouth 2 (two) times daily.    Dispense:  60 tablet     Refill:  2   Signed,  Estefani Bateson T. Wyonia Fontanella, MD   Allergies as of 09/06/2017   No Known Allergies     Medication List        Accurate as of 09/06/17  1:49 PM. Always use your most recent med list.          aspirin 81 MG EC tablet TAKE 1 TABLET BY MOUTH EVERY DAY   clindamycin 1 % gel Commonly known as:  CLINDAGEL Apply topically 2 (  two) times daily.   diclofenac 75 MG EC tablet Commonly known as:  VOLTAREN Take 1 tablet (75 mg total) by mouth 2 (two) times daily.   losartan 100 MG tablet Commonly known as:  COZAAR TAKE 1 TABLET (100 MG TOTAL) BY MOUTH DAILY.   omeprazole 40 MG capsule Commonly known as:  PRILOSEC TAKE ONE CAPSULE BY MOUTH EVERY DAY   PRESCRIPTION MEDICATION Inhale into the lungs at bedtime. CPAP   simvastatin 40 MG tablet Commonly known as:  ZOCOR TAKE 1 TABLET (40 MG TOTAL) BY MOUTH DAILY.   triamterene-hydrochlorothiazide 37.5-25 MG tablet Commonly known as:  MAXZIDE-25 TAKE 1 TABLET BY MOUTH ONCE DAILY

## 2017-09-14 ENCOUNTER — Other Ambulatory Visit: Payer: Self-pay

## 2017-09-14 ENCOUNTER — Ambulatory Visit: Payer: BC Managed Care – PPO | Admitting: Family Medicine

## 2017-09-14 ENCOUNTER — Encounter: Payer: Self-pay | Admitting: Family Medicine

## 2017-09-14 DIAGNOSIS — J069 Acute upper respiratory infection, unspecified: Secondary | ICD-10-CM

## 2017-09-14 DIAGNOSIS — S76011D Strain of muscle, fascia and tendon of right hip, subsequent encounter: Secondary | ICD-10-CM

## 2017-09-14 DIAGNOSIS — B9789 Other viral agents as the cause of diseases classified elsewhere: Secondary | ICD-10-CM

## 2017-09-14 DIAGNOSIS — S76019A Strain of muscle, fascia and tendon of unspecified hip, initial encounter: Secondary | ICD-10-CM | POA: Insufficient documentation

## 2017-09-14 NOTE — Assessment & Plan Note (Signed)
Resolving  With NSAIDs, ice and time.  Cleared to return to work.

## 2017-09-14 NOTE — Progress Notes (Signed)
   Subjective:    Patient ID: Todd Owens, male    DOB: [birth year], 52 y.o.   MRN: [id number]  HPI   52 year old morbidly obesemale presents for follow up on  Strain of flexor muscle of right hip.  Seen by Dr. Lorelei Pont on 09/06/2017 Slipped on rug and afterward noted pain in right lower abdomen/hip No hernia noted.  Ice, NSAIDs (diclofenac) and out of work until this appt.    He has had improvement in pain, still sore with sneezing and change in position.  No new numbness, no weakness.  He has also noted days of chest congestion in last 3 days. Some nasal congestion.  no fever, feels well overall.  no ST.  Mild chest tightness and shortness of breath.   No asthma, nonsmoker  Blood pressure 100/70, pulse 66, temperature 98.5 F (36.9 C), temperature source Oral, height 5\' 9"  (1.753 m), weight (!) 337 lb 8 oz (153.1 kg), SpO2 95 %.  Review of Systems  Constitutional: Negative for fatigue and fever.  HENT: Positive for congestion. Negative for ear pain.   Eyes: Negative for pain.  Respiratory: Positive for cough.   Cardiovascular: Negative for chest pain, palpitations and leg swelling.  Gastrointestinal: Negative for abdominal pain.  Genitourinary: Negative for dysuria.  Musculoskeletal: Negative for arthralgias.  Neurological: Negative for syncope, light-headedness and headaches.  Psychiatric/Behavioral: Negative for dysphoric mood.       Objective:   Physical Exam  Constitutional: Vital signs are normal. He appears well-developed and well-nourished.  Non-toxic appearance. He does not appear ill. No distress.  Morbidly obese  HENT:  Head: Normocephalic and atraumatic.  Right Ear: Hearing, tympanic membrane, external ear and ear canal normal. No tenderness. No foreign bodies. Tympanic membrane is not retracted and not bulging.  Left Ear: Hearing, tympanic membrane, external ear and ear canal normal. No tenderness. No foreign bodies. Tympanic membrane is not retracted and not  bulging.  Nose: Nose normal. No mucosal edema or rhinorrhea. Right sinus exhibits no maxillary sinus tenderness and no frontal sinus tenderness. Left sinus exhibits no maxillary sinus tenderness and no frontal sinus tenderness.  Mouth/Throat: Uvula is midline, oropharynx is clear and moist and mucous membranes are normal. Normal dentition. No dental caries. No oropharyngeal exudate or tonsillar abscesses.  Eyes: Conjunctivae, EOM and lids are normal. Pupils are equal, round, and reactive to light. Lids are everted and swept, no foreign bodies found.  Neck: Trachea normal, normal range of motion and phonation normal. Neck supple. Carotid bruit is not present. No thyroid mass and no thyromegaly present.  Cardiovascular: Normal rate, regular rhythm, S1 normal, S2 normal, normal heart sounds, intact distal pulses and normal pulses. Exam reveals no gallop.  No murmur heard. Pulmonary/Chest: Effort normal and breath sounds normal. No respiratory distress. He has no wheezes. He has no rhonchi. He has no rales.  Abdominal: Soft. Normal appearance and bowel sounds are normal. There is no hepatosplenomegaly. There is no tenderness. There is no rebound, no guarding and no CVA tenderness. No hernia.  Neurological: He is alert. He has normal reflexes.  Skin: Skin is warm, dry and intact. No rash noted.  Psychiatric: He has a normal mood and affect. His speech is normal and behavior is normal. Judgment normal.          Assessment & Plan:

## 2017-09-14 NOTE — Patient Instructions (Addendum)
Use diclofenac as need for pain, gradually wean of as able.  Can use mucinex DM or benzonatate.

## 2017-09-14 NOTE — Assessment & Plan Note (Signed)
Symptomatic care.  Mucinex DM.

## 2017-11-11 ENCOUNTER — Other Ambulatory Visit: Payer: Self-pay | Admitting: Family Medicine

## 2017-11-25 ENCOUNTER — Other Ambulatory Visit: Payer: Self-pay | Admitting: Family Medicine

## 2017-12-02 ENCOUNTER — Telehealth: Payer: Self-pay | Admitting: Family Medicine

## 2017-12-02 DIAGNOSIS — R7303 Prediabetes: Secondary | ICD-10-CM

## 2017-12-02 DIAGNOSIS — E785 Hyperlipidemia, unspecified: Secondary | ICD-10-CM

## 2017-12-02 DIAGNOSIS — Z125 Encounter for screening for malignant neoplasm of prostate: Secondary | ICD-10-CM

## 2017-12-02 NOTE — Telephone Encounter (Signed)
-----   Message from Lendon Collar, RT sent at 11/24/2017  5:18 PM EDT ----- Regarding: Lab orders for 12/03/17 Please enter CPX lab orders for Friday 12/03/17. Thanks-Lauren

## 2017-12-03 ENCOUNTER — Other Ambulatory Visit (INDEPENDENT_AMBULATORY_CARE_PROVIDER_SITE_OTHER): Payer: BC Managed Care – PPO

## 2017-12-03 DIAGNOSIS — R7303 Prediabetes: Secondary | ICD-10-CM

## 2017-12-03 DIAGNOSIS — Z125 Encounter for screening for malignant neoplasm of prostate: Secondary | ICD-10-CM | POA: Diagnosis not present

## 2017-12-03 DIAGNOSIS — E785 Hyperlipidemia, unspecified: Secondary | ICD-10-CM | POA: Diagnosis not present

## 2017-12-03 LAB — LIPID PANEL
CHOL/HDL RATIO: 4
Cholesterol: 131 mg/dL (ref 0–200)
HDL: 30.8 mg/dL — AB (ref >39.00)
LDL Cholesterol: 85 mg/dL (ref 0–99)
NonHDL: 100.47
TRIGLYCERIDES: 77 mg/dL (ref 0.0–149.0)
VLDL: 15.4 mg/dL (ref 0.0–40.0)

## 2017-12-03 LAB — COMPREHENSIVE METABOLIC PANEL
ALK PHOS: 60 U/L (ref 39–117)
ALT: 25 U/L (ref 0–53)
AST: 24 U/L (ref 0–37)
Albumin: 4 g/dL (ref 3.5–5.2)
BUN: 17 mg/dL (ref 6–23)
CO2: 31 mEq/L (ref 19–32)
Calcium: 9.1 mg/dL (ref 8.4–10.5)
Chloride: 101 mEq/L (ref 96–112)
Creatinine, Ser: 1.2 mg/dL (ref 0.40–1.50)
GFR: 67.64 mL/min (ref >60.00)
GLUCOSE: 121 mg/dL — AB (ref 70–99)
POTASSIUM: 4 meq/L (ref 3.5–5.1)
Sodium: 140 mEq/L (ref 135–145)
TOTAL PROTEIN: 6.9 g/dL (ref 6.0–8.3)
Total Bilirubin: 0.4 mg/dL (ref 0.2–1.2)

## 2017-12-03 LAB — HEMOGLOBIN A1C: Hgb A1c MFr Bld: 6.1 % (ref 4.6–6.5)

## 2017-12-03 LAB — PSA: PSA: 0.31 ng/mL (ref 0.10–4.00)

## 2017-12-07 ENCOUNTER — Telehealth: Payer: Self-pay | Admitting: *Deleted

## 2017-12-07 ENCOUNTER — Ambulatory Visit (INDEPENDENT_AMBULATORY_CARE_PROVIDER_SITE_OTHER): Payer: BC Managed Care – PPO | Admitting: Family Medicine

## 2017-12-07 ENCOUNTER — Encounter: Payer: Self-pay | Admitting: Family Medicine

## 2017-12-07 ENCOUNTER — Encounter: Payer: Self-pay | Admitting: Gastroenterology

## 2017-12-07 VITALS — BP 118/62 | HR 2 | Temp 98.0°F | Ht 69.75 in | Wt 332.5 lb

## 2017-12-07 DIAGNOSIS — Z0000 Encounter for general adult medical examination without abnormal findings: Secondary | ICD-10-CM | POA: Diagnosis not present

## 2017-12-07 DIAGNOSIS — L719 Rosacea, unspecified: Secondary | ICD-10-CM

## 2017-12-07 DIAGNOSIS — Z1211 Encounter for screening for malignant neoplasm of colon: Secondary | ICD-10-CM | POA: Diagnosis not present

## 2017-12-07 DIAGNOSIS — R7303 Prediabetes: Secondary | ICD-10-CM | POA: Diagnosis not present

## 2017-12-07 MED ORDER — DOXYCYCLINE HYCLATE 100 MG PO TABS: 100.0000 mg | ORAL_TABLET | Freq: Every day | ORAL | 0 refills | Status: DC

## 2017-12-07 MED ORDER — TRETINOIN 0.01 % EX GEL: Freq: Every day | CUTANEOUS | 0 refills | Status: DC

## 2017-12-07 NOTE — Telephone Encounter (Signed)
PA approved effective 12/07/2017 through 12/07/2020.  [facility] notified of approval via fax.

## 2017-12-07 NOTE — Assessment & Plan Note (Signed)
With evidence of chronic changes. Not responding to topical antibiotics. Change to 12 weeks of oral low dose doxy, and retinol. If not improving in 6-12 weeks.. Consider referral to [facility] for possible bx or further treatment.

## 2017-12-07 NOTE — Telephone Encounter (Signed)
Received fax from [facility] requesting PA for Tretinoin Gel. PA completed on CoverMyMeds and sent for review.  Can take up to 72 hours for a decision.

## 2017-12-07 NOTE — Assessment & Plan Note (Signed)
Stable control. Encouraged exercise, weight loss, healthy eating habits.  

## 2017-12-07 NOTE — Patient Instructions (Addendum)
Please stop at the front desk to set up referral.  For skin changes on nose... Likely rosacea.. Stop clinda gel.  Complete 12 weeks of doxycycline  Daily. Apply retina  Daily.  If not improving.. Call for derm referral.

## 2017-12-07 NOTE — Progress Notes (Signed)
Subjective:    Patient ID: Todd Owens, male    DOB: 14-Aug-1965, 52 y.o.   MRN: [id number]  HPI The patient is here for annual wellness exam and preventative care.     Redness and bumpiness on nose.. Mainly on left nares more prominent for 1 year. occ white heads on nasal crease. Prominent blood vessel on nose.  Using clindamycin gel. As needed.   Hypertension: Good control on losartan. BP Readings from Last 3 Encounters:  12/07/17 118/62  09/14/17 100/70  09/06/17 110/60  Using medication without problems or lightheadedness:  none Chest pain with exertion: none Edema:none Short of breath: none Average home BPs: Other issues: prediabetes  Lab Results  Component Value Date   HGBA1C 6.1 12/03/2017    Sleep apnea: on CPAP.  Elevated Cholesterol:  LDL at goal on simvastatin Lab Results  Component Value Date   CHOL 131 12/03/2017   HDL 30.80 (L) 12/03/2017   LDLCALC 85 12/03/2017   TRIG 77.0 12/03/2017   CHOLHDL 4 12/03/2017  Using medications without problems: Muscle aches:  Diet compliance: moderate Exercise: working on exercise Other complaints:   Social History /Family History/Past Medical History reviewed in detail and updated in EMR if needed. Blood pressure 118/62, pulse (!) 2, temperature 98 F (36.7 C), temperature source Oral, height 5' 9.75" (1.772 m), weight (!) 332 lb 8 oz (150.8 kg), SpO2 97 %.  Review of Systems  Constitutional: Negative for fatigue and fever.  HENT: Negative for ear pain.   Eyes: Negative for pain.  Respiratory: Negative for cough and shortness of breath.   Cardiovascular: Negative for chest pain, palpitations and leg swelling.  Gastrointestinal: Negative for abdominal pain.  Genitourinary: Negative for dysuria.  Musculoskeletal: Negative for arthralgias.  Neurological: Negative for syncope, light-headedness and headaches.  Psychiatric/Behavioral: Negative for dysphoric mood.       Objective:   Physical Exam    Constitutional: He appears well-developed and well-nourished.  Non-toxic appearance. He does not appear ill. No distress.  HENT:  Head: Normocephalic and atraumatic.  Right Ear: Hearing, tympanic membrane, external ear and ear canal normal.  Left Ear: Hearing, tympanic membrane, external ear and ear canal normal.  Nose: Nose normal.  Mouth/Throat: Uvula is midline, oropharynx is clear and moist and mucous membranes are normal.  Eyes: Pupils are equal, round, and reactive to light. Conjunctivae, EOM and lids are normal. Lids are everted and swept, no foreign bodies found.  Neck: Trachea normal, normal range of motion and phonation normal. Neck supple. Carotid bruit is not present. No thyroid mass and no thyromegaly present.  Cardiovascular: Normal rate, regular rhythm, S1 normal, S2 normal, intact distal pulses and normal pulses. Exam reveals no gallop.  No murmur heard. Pulmonary/Chest: Breath sounds normal. He has no wheezes. He has no rhonchi. He has no rales.  Abdominal: Soft. Normal appearance and bowel sounds are normal. There is no hepatosplenomegaly. There is no tenderness. There is no rebound, no guarding and no CVA tenderness. No hernia. Hernia confirmed negative in the right inguinal area and confirmed negative in the left inguinal area.  Genitourinary: Prostate normal, testes normal and penis normal. Rectal exam shows no external hemorrhoid, no internal hemorrhoid, no fissure, no mass, no tenderness, anal tone normal and guaiac negative stool. Prostate is not enlarged and not tender. Right testis shows no mass and no tenderness. Left testis shows no mass and no tenderness. No paraphimosis or penile tenderness.  Lymphadenopathy:    He has no cervical adenopathy.  Right: No inguinal adenopathy present.       Left: No inguinal adenopathy present.  Neurological: He is alert. He has normal strength and normal reflexes. No cranial nerve deficit or sensory deficit. Gait normal.  Skin:  Skin is warm, dry and intact. No rash noted.  Hypertrophy/nodularity of pores on left nares, erythema and increase vascularity over nose and cheeks  Psychiatric: He has a normal mood and affect. His speech is normal and behavior is normal. Judgment normal.          Assessment & Plan:  The patient's preventative maintenance and recommended screening tests for an annual wellness exam were reviewed in full today. Brought up to date unless services declined.  Counselled on the importance of diet, exercise, and its role in overall health and mortality. The patient's FH and SH was reviewed, including their home life, tobacco status, and drug and alcohol status.   Vaccine: Td 2014. Former smoker, Quit 15-20 years ago Prostate,  Lab Results  Component Value Date   PSA 0.31 12/03/2017  colon cancer screen: plan colonoscopy No STD testing requested. CZY:[id number] Hep C:not indicated.  No genital exam.

## 2017-12-19 ENCOUNTER — Other Ambulatory Visit: Payer: Self-pay | Admitting: Family Medicine

## 2018-02-03 ENCOUNTER — Other Ambulatory Visit: Payer: Self-pay

## 2018-02-03 ENCOUNTER — Ambulatory Visit (AMBULATORY_SURGERY_CENTER): Payer: Self-pay

## 2018-02-03 VITALS — Ht 69.0 in | Wt 338.8 lb

## 2018-02-03 DIAGNOSIS — Z1211 Encounter for screening for malignant neoplasm of colon: Secondary | ICD-10-CM

## 2018-02-03 MED ORDER — NA SULFATE-K SULFATE-MG SULF 17.5-3.13-1.6 GM/177ML PO SOLN: 1.0000 | Freq: Once | ORAL | 0 refills | Status: AC

## 2018-02-03 NOTE — Progress Notes (Signed)
Denies allergies to eggs or soy products. Denies complication of anesthesia or sedation. Denies use of weight loss medication. Denies use of O2.   Todd Owens instructions declined.   Todd Owens the CRNA spoke to the patient in Pre-Visit. Todd Owens has Sleep Apnea and a thick neck. After speaking with the patient and examining the thickness of the patients neck, Todd Owens  gave the ok for Todd Owens to  have his procedure at the [facility].   Todd Sheer, LPN

## 2018-02-04 ENCOUNTER — Encounter: Payer: Self-pay | Admitting: Gastroenterology

## 2018-02-18 ENCOUNTER — Other Ambulatory Visit: Payer: Self-pay | Admitting: Family Medicine

## 2018-02-18 ENCOUNTER — Ambulatory Visit (AMBULATORY_SURGERY_CENTER): Payer: BC Managed Care – PPO | Admitting: Gastroenterology

## 2018-02-18 ENCOUNTER — Encounter: Payer: Self-pay | Admitting: Gastroenterology

## 2018-02-18 VITALS — BP 118/66 | HR 58 | Temp 97.5°F | Resp 19 | Ht 69.0 in | Wt 338.0 lb

## 2018-02-18 DIAGNOSIS — D128 Benign neoplasm of rectum: Secondary | ICD-10-CM

## 2018-02-18 DIAGNOSIS — D122 Benign neoplasm of ascending colon: Secondary | ICD-10-CM

## 2018-02-18 DIAGNOSIS — D125 Benign neoplasm of sigmoid colon: Secondary | ICD-10-CM

## 2018-02-18 DIAGNOSIS — Z1211 Encounter for screening for malignant neoplasm of colon: Secondary | ICD-10-CM | POA: Diagnosis present

## 2018-02-18 DIAGNOSIS — D129 Benign neoplasm of anus and anal canal: Secondary | ICD-10-CM

## 2018-02-18 DIAGNOSIS — K621 Rectal polyp: Secondary | ICD-10-CM | POA: Diagnosis not present

## 2018-02-18 MED ORDER — SODIUM CHLORIDE 0.9 % IV SOLN: 500.0000 mL | Freq: Once | INTRAVENOUS | Status: DC

## 2018-02-18 NOTE — Patient Instructions (Signed)
*  handout on polyps and hemorrhoids given*  YOU HAD AN ENDOSCOPIC PROCEDURE TODAY AT THE [facility]:   Refer to the procedure report that was given to you for any specific questions about what was found during the examination.  If the procedure report does not answer your questions, please call your gastroenterologist to clarify.  If you requested that your care partner not be given the details of your procedure findings, then the procedure report has been included in a sealed envelope for you to review at your convenience later.  YOU SHOULD EXPECT: Some feelings of bloating in the abdomen. Passage of more gas than usual.  Walking can help get rid of the air that was put into your GI tract during the procedure and reduce the bloating. If you had a lower endoscopy (such as a colonoscopy or flexible sigmoidoscopy) you may notice spotting of blood in your stool or on the toilet paper. If you underwent a bowel prep for your procedure, you may not have a normal bowel movement for a few days.  Please Note:  You might notice some irritation and congestion in your nose or some drainage.  This is from the oxygen used during your procedure.  There is no need for concern and it should clear up in a day or so.  SYMPTOMS TO REPORT IMMEDIATELY:   Following lower endoscopy (colonoscopy or flexible sigmoidoscopy):  Excessive amounts of blood in the stool  Significant tenderness or worsening of abdominal pains  Swelling of the abdomen that is new, acute  Fever of 100F or higher   For urgent or emergent issues, a gastroenterologist can be reached at any hour by calling [phone].   DIET:  We do recommend a small meal at first, but then you may proceed to your regular diet.  Drink plenty of fluids but you should avoid alcoholic beverages for 24 hours.  ACTIVITY:  You should plan to take it easy for the rest of today and you should NOT DRIVE or use heavy machinery until tomorrow (because of  the sedation medicines used during the test).    FOLLOW UP: Our staff will call the number listed on your records the next business day following your procedure to check on you and address any questions or concerns that you may have regarding the information given to you following your procedure. If we do not reach you, we will leave a message.  However, if you are feeling well and you are not experiencing any problems, there is no need to return our call.  We will assume that you have returned to your regular daily activities without incident.  If any biopsies were taken you will be contacted by phone or by letter within the next 1-3 weeks.  Please call us at [phone] if you have not heard about the biopsies in 3 weeks.    SIGNATURES/CONFIDENTIALITY: You and/or your care partner have signed paperwork which will be entered into your electronic medical record.  These signatures attest to the fact that that the information above on your After Visit Summary has been reviewed and is understood.  Full responsibility of the confidentiality of this discharge information lies with you and/or your care-partner. 

## 2018-02-18 NOTE — Progress Notes (Signed)
Called to room to assist during endoscopic procedure.  Patient ID and intended procedure confirmed with present staff. Received instructions for my participation in the procedure from the performing physician.  

## 2018-02-18 NOTE — Progress Notes (Signed)
Pt. Reports no change in his medical or surgical history since his pre-visit 02/03/2018.

## 2018-02-18 NOTE — Progress Notes (Signed)
A/ox3, pleased with MAC, report to RN 

## 2018-02-18 NOTE — Op Note (Signed)
[place] Patient Name: Todd Owens Procedure Date: 02/18/2018 1:14 PM MRN: [id number] Endoscopist: Mauri Pole , MD Age: 52 Referring MD:  Date of Birth: [birth year] Gender: Male Account #: [id number] Procedure:                Colonoscopy Indications:              Screening for colorectal malignant neoplasm Medicines:                Monitored Anesthesia Care Procedure:                Pre-Anesthesia Assessment:                           - Prior to the procedure, a History and Physical                            was performed, and patient medications and                            allergies were reviewed. The patient's tolerance of                            previous anesthesia was also reviewed. The risks                            and benefits of the procedure and the sedation                            options and risks were discussed with the patient.                            All questions were answered, and informed consent                            was obtained. Prior Anticoagulants: The patient has                            taken no previous anticoagulant or antiplatelet                            agents. ASA Grade Assessment: III - A patient with                            severe systemic disease. After reviewing the risks                            and benefits, the patient was deemed in                            satisfactory condition to undergo the procedure.                           After obtaining informed consent, the colonoscope  was passed under direct vision. Throughout the                            procedure, the patient's blood pressure, pulse, and                            oxygen saturations were monitored continuously. The                            Model PCF-H190DL [id number]) scope was introduced                            through the anus and advanced to the the cecum,                            identified by  appendiceal orifice and ileocecal                            valve. The colonoscopy was performed without                            difficulty. The patient tolerated the procedure                            well. The quality of the bowel preparation was                            adequate. The ileocecal valve, appendiceal orifice,                            and rectum were photographed. Scope In: 1:16:39 PM Scope Out: 1:49:46 PM Scope Withdrawal Time: 0 hours 25 minutes 35 seconds  Total Procedure Duration: 0 hours 33 minutes 7 seconds  Findings:                 The perianal and digital rectal examinations were                            normal.                           A 5 mm polyp was found in the ascending colon. The                            polyp was sessile. The polyp was removed with a                            cold snare. Resection and retrieval were complete.                           Two sessile polyps were found in the rectum and                            sigmoid colon. The polyps were 1 to 2 mm  in size.                            These polyps were removed with a cold biopsy                            forceps. Resection and retrieval were complete.                           Non-bleeding internal hemorrhoids were found during                            retroflexion. The hemorrhoids were small. Complications:            No immediate complications. Estimated Blood Loss:     Estimated blood loss was minimal. Impression:               - One 5 mm polyp in the ascending colon, removed                            with a cold snare. Resected and retrieved.                           - Two 1 to 2 mm polyps in the rectum and in the                            sigmoid colon, removed with a cold biopsy forceps.                            Resected and retrieved.                           - Non-bleeding internal hemorrhoids. Recommendation:           - Patient has a contact number  available for                            emergencies. The signs and symptoms of potential                            delayed complications were discussed with the                            patient. Return to normal activities tomorrow.                            Written discharge instructions were provided to the                            patient.                           - Resume previous diet.                           - Continue present medications.                           -  Await pathology results.                           - Repeat colonoscopy in 3 - 5 years for                            surveillance based on pathology results. Mauri Pole, MD 02/18/2018 1:55:13 PM This report has been signed electronically.

## 2018-02-21 ENCOUNTER — Telehealth: Payer: Self-pay | Admitting: *Deleted

## 2018-02-21 NOTE — Telephone Encounter (Signed)
  Follow up Call-  Call back number 02/18/2018  Post procedure Call Back phone  # [phone]  Permission to leave phone message Yes  Some recent data might be hidden     Patient questions:  Do you have a fever, pain , or abdominal swelling? No. Pain Score  0 *  Have you tolerated food without any problems? Yes.    Have you been able to return to your normal activities? Yes.    Do you have any questions about your discharge instructions: Diet   No. Medications  No. Follow up visit  No.  Do you have questions or concerns about your Care? No.  Actions: * If pain score is 4 or above: No action needed, pain <4.

## 2018-03-03 ENCOUNTER — Encounter: Payer: Self-pay | Admitting: Gastroenterology

## 2018-03-29 ENCOUNTER — Encounter: Payer: Self-pay | Admitting: Family Medicine

## 2018-03-29 ENCOUNTER — Ambulatory Visit: Payer: BC Managed Care – PPO | Admitting: Family Medicine

## 2018-03-29 VITALS — BP 118/62 | HR 60 | Temp 98.2°F | Ht 69.75 in | Wt 327.5 lb

## 2018-03-29 DIAGNOSIS — L293 Anogenital pruritus, unspecified: Secondary | ICD-10-CM

## 2018-03-29 DIAGNOSIS — L290 Pruritus ani: Secondary | ICD-10-CM

## 2018-03-29 DIAGNOSIS — K594 Anal spasm: Secondary | ICD-10-CM | POA: Diagnosis not present

## 2018-03-29 DIAGNOSIS — Z23 Encounter for immunization: Secondary | ICD-10-CM | POA: Diagnosis not present

## 2018-03-29 MED ORDER — NITROGLYCERIN 2 % TD OINT: TOPICAL_OINTMENT | TRANSDERMAL | 0 refills | Status: DC

## 2018-03-29 NOTE — Progress Notes (Signed)
Subjective:    Patient ID: Todd Owens, male    DOB: 1965/11/14, 52 y.o.   MRN: [id number]  HPI Here for rectal pain  Was awakened by a sharp pain in rectum today  Severe (sharp) -has had it before but not to that extent (every once in a while)  30-60 minutes of pain  He tried to have a BM (felt like he needed to) - did not pass anything  No flank or back pain  Still sore feeling  Last bm this am and it did not work   No blood in stool  No straining  No constipation problems  No diarrhea   No abdominal pain   Wt Readings from Last 3 Encounters:  03/29/18 (!) 327 lb 8 oz (148.6 kg)  02/18/18 (!) 338 lb (153.3 kg)  02/03/18 (!) 338 lb 12.8 oz (153.7 kg)   47.33 kg/m   Had a colonoscopy 8/19  Polyps Internal hemorrhoids-non bleeding  Also c/o occ itching in perianal area-no rash or jock itch Tried gold bond powder and an antifungal product with no imp  Patient Active Problem List   Diagnosis Date Noted  . 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 Strain of hip flexor 09/14/2017  . Plantar fasciitis of left foot 03/30/2017  . De Quervain's tenosynovitis, left 12/31/2016  . Vasovagal syncope 10/21/2016  . Left LBP 10/04/2015  . Family history of premature CAD 10/09/2014  . Prediabetes 05/26/2013  . Metabolic syndrome 48/88/9169  . Hyperprolactinemia ([place]) 09/26/2010  . Testosterone deficiency 09/17/2009  . 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 ONYCHOMYCOSIS, BILATERAL 09/14/2008  . GERD 09/14/2008  . Obstructive sleep apnea 11/25/2007  . Essential hypertension, benign 11/14/2007  . MORBID OBESITY 10/07/2007  . ACNE, ROSACEA 10/07/2007  . Hyperlipidemia 10/03/2007   Past Medical History:  Diagnosis Date  . Acute sinusitis, unspecified   . Dermatophytosis of nail   . Esophageal reflux   . Essential hypertension, benign   . Impotence of organic origin   . Morbid obesity ([place])   . Obstructive sleep apnea (adult) (pediatric)   . Other and unspecified  hyperlipidemia   . Other malaise and fatigue   . Other testicular hypofunction   . Pure hypercholesterolemia   . Rosacea   . Routine general medical examination at a health care facility   . Sleep apnea    Past Surgical History:  Procedure Laterality Date  . BACK SURGERY  2003  . cardiolyte  2001   neg  . KNEE SURGERY  2005  . PFT  2007   WNL  . WRIST SURGERY Right    Social History   Tobacco Use  . Smoking status: Former Smoker    Packs/day: 1.00    Years: 15.00    Pack years: 15.00    Last attempt to quit: 1995    Years since quitting: 24.7  . Smokeless tobacco: Never Used  Substance Use Topics  . Alcohol use: No  . Drug use: No   Family History  Problem Relation Age of Onset  . Hypertension Mother   . Heart disease Mother        pacemaker  . Hypertension Father   . Stroke Father   . Heart disease Father 76  . Heart attack Father   . Heart disease Sister   . Heart attack Sister 39  . Heart failure Sister   . Colon cancer Neg Hx   . Esophageal cancer Neg Hx   . Liver cancer Neg Hx   .  Pancreatic cancer Neg Hx   . Stomach cancer Neg Hx   . Rectal cancer Neg Hx    No Known Allergies Current Outpatient Medications on File Prior to Visit  Medication Sig Dispense Refill  . ASPIRIN ADULT LOW STRENGTH 81 MG EC tablet TAKE 1 TABLET BY MOUTH EVERY DAY 90 tablet 3  . losartan (COZAAR) 100 MG tablet TAKE 1 TABLET BY MOUTH EVERY DAY 90 tablet 1  . omeprazole (PRILOSEC) 40 MG capsule TAKE 1 CAPSULE BY MOUTH EVERY DAY 90 capsule 3  . PRESCRIPTION MEDICATION Inhale into the lungs at bedtime. CPAP    . simvastatin (ZOCOR) 40 MG tablet TAKE 1 TABLET BY MOUTH EVERY DAY 90 tablet 3  . tretinoin (RETIN-A) 0.01 % gel Apply topically at bedtime. 45 g 0  . triamterene-hydrochlorothiazide (MAXZIDE-25) 37.5-25 MG tablet TAKE 1 TABLET BY MOUTH EVERY DAY 90 tablet 1   Current Facility-Administered Medications on File Prior to Visit  Medication Dose Route Frequency Provider Last  Rate Last Dose  . 0.9 %  sodium chloride infusion  500 mL Intravenous Once Nandigam, Kavitha V, MD        Review of Systems  Constitutional: Negative for activity change, appetite change, fatigue, fever and unexpected weight change.  HENT: Negative for congestion, rhinorrhea, sore throat and trouble swallowing.   Eyes: Negative for pain, redness, itching and visual disturbance.  Respiratory: Negative for cough, chest tightness, shortness of breath and wheezing.   Cardiovascular: Negative for chest pain and palpitations.  Gastrointestinal: Positive for rectal pain. Negative for abdominal distention, abdominal pain, anal bleeding, blood in stool, constipation, diarrhea, nausea and vomiting.  Endocrine: Negative for cold intolerance, heat intolerance, polydipsia and polyuria.  Genitourinary: Negative for difficulty urinating, dysuria, frequency and urgency.  Musculoskeletal: Negative for arthralgias, joint swelling and myalgias.  Skin: Negative for pallor and rash.       Perianal/perineal itching occ   Neurological: Negative for dizziness, tremors, weakness, numbness and headaches.  Hematological: Negative for adenopathy. Does not bruise/bleed easily.  Psychiatric/Behavioral: Negative for decreased concentration and dysphoric mood. The patient is not nervous/anxious.        Objective:   Physical Exam  Constitutional: He appears well-developed and well-nourished. No distress.  obese and well appearing   HENT:  Head: Normocephalic and atraumatic.  Mouth/Throat: Oropharynx is clear and moist.  Eyes: Pupils are equal, round, and reactive to light. Conjunctivae and EOM are normal. No scleral icterus.  Neck: Normal range of motion. Neck supple.  Cardiovascular: Normal rate, regular rhythm and normal heart sounds.  Pulmonary/Chest: Effort normal and breath sounds normal. No respiratory distress.  Abdominal: Soft. Bowel sounds are normal. He exhibits no distension. There is no tenderness.  No  suprapubic tenderness or fullness    Obese abdomen  Genitourinary: Rectal exam shows internal hemorrhoid. Rectal exam shows no fissure, no mass, no tenderness, anal tone normal and guaiac negative stool.  Genitourinary Comments: Few internal non bleeding hemorrhoids   No fissure No tenderness Guaiac neg stool  Nl tone  Lymphadenopathy:    He has no cervical adenopathy.  Neurological: He is alert. He exhibits normal muscle tone. Coordination normal.  Skin: Skin is warm and dry. No rash noted. No erythema. No pallor.  No perineal rash or excoriation noted   Ruddy complexion   Psychiatric: He has a normal mood and affect.          Assessment & Plan:   Problem List Items Addressed This Visit      Digestive  Proctalgia fugax - Primary    Episodes of sharp rectal pain occasionally - often wakes from sleep , usually brief (but more severe last night) Nl exam (mild int hem) but no pain with bms (even this am)  Suspect proctalgia fugax Rev recent colonoscopy as well  Handout given  Px nitroglycerin ointment to try rectally prn  Update if not helpful or if symptoms worsen/ inc in frequency   Meds ordered this encounter  Medications  . nitroGLYCERIN (NITROGLYN) 2 % ointment    Sig: Use 1 inch of ointment rectally as needed for rectal pain (no more than once daily)    Dispense:  30 g    Refill:  0            Musculoskeletal and Integument   Perineal itching, male    No rash or abn seen  Disc cleaning well with non scented baby wipe after bm and dry well also Failed gold bond and otc anti fungal   inst to try otc mild cortisone product like cort aid Update        Other Visit Diagnoses    Need for influenza vaccination       Relevant Orders   Flu Vaccine QUAD 6+ mos PF IM (Fluarix Quad PF) (Completed)

## 2018-03-29 NOTE — Assessment & Plan Note (Signed)
Episodes of sharp rectal pain occasionally - often wakes from sleep , usually brief (but more severe last night) Nl exam (mild int hem) but no pain with bms (even this am)  Suspect proctalgia fugax Rev recent colonoscopy as well  Handout given  Px nitroglycerin ointment to try rectally prn  Update if not helpful or if symptoms worsen/ inc in frequency   Meds ordered this encounter  Medications  . nitroGLYCERIN (NITROGLYN) 2 % ointment    Sig: Use 1 inch of ointment rectally as needed for rectal pain (no more than once daily)    Dispense:  30 g    Refill:  0

## 2018-03-29 NOTE — Patient Instructions (Signed)
I think you are having proctalgia fugax - a suspected spasm of rectum  Your exam is normal/re assuring   Try the nitroglycerin ointment if you have another episode that lasts more than a few minutes   For the itching- try cort aid over the counter   After bm - wipe with unscented baby wipe and dry well   Update [place] if this does not help

## 2018-03-29 NOTE — Assessment & Plan Note (Signed)
No rash or abn seen  Disc cleaning well with non scented baby wipe after bm and dry well also Failed gold bond and otc anti fungal   inst to try otc mild cortisone product like cort aid Update

## 2018-04-01 ENCOUNTER — Telehealth: Payer: Self-pay | Admitting: Family Medicine

## 2018-04-01 DIAGNOSIS — L293 Anogenital pruritus, unspecified: Secondary | ICD-10-CM

## 2018-04-01 DIAGNOSIS — L290 Pruritus ani: Secondary | ICD-10-CM

## 2018-04-01 NOTE — Telephone Encounter (Signed)
Copied from [place] [id number]. Topic: General - Other >> Apr 01, 2018  8:38 AM Janace Aris A wrote: Reason for CRM: Patient called in and said, "he would like to have a referral to a dermatologist, because his skin is not getting any better". Patient says he would like any Doctor "preferably in the [place] area".    Please advise.

## 2018-04-01 NOTE — Telephone Encounter (Signed)
Called the patient , will call Derm on Monday when reopened.Patient is aware.

## 2018-04-01 NOTE — Telephone Encounter (Signed)
Referred for acute on chronic perineal itching unresponsive to several treatments Will forward to [facility] Also cc PCP dr Diona Browner so she is aware Thanks

## 2018-04-10 ENCOUNTER — Other Ambulatory Visit: Payer: Self-pay | Admitting: Family Medicine

## 2018-04-11 NOTE — Telephone Encounter (Signed)
Last office visit 03/29/18 with Dr. Glori Bickers for Proctalgia Fugax.  Patient was started on Doxycycline 12/07/2017 for Rosacea by Dr. Diona Browner.  Was advised if not improving to consider Derm Referral.  Refill?

## 2018-05-20 ENCOUNTER — Ambulatory Visit (INDEPENDENT_AMBULATORY_CARE_PROVIDER_SITE_OTHER): Payer: BC Managed Care – PPO | Admitting: Adult Health

## 2018-05-20 ENCOUNTER — Encounter: Payer: Self-pay | Admitting: Adult Health

## 2018-05-20 DIAGNOSIS — G4733 Obstructive sleep apnea (adult) (pediatric): Secondary | ICD-10-CM

## 2018-05-20 NOTE — Progress Notes (Signed)
@Patient  ID: Todd Owens, male    DOB: 04-06-1966, 52 y.o.   MRN: [id number]  Chief Complaint  Patient presents with  . Follow-up    OSA    Referring provider: Jinny Sanders, MD  HPI: 52 year old male seen for initial sleep consult November 2017 found to have moderate to severe sleep apnea  TEST  Sleep study November 2017- AHI 29/hour  05/20/2018 Follow-up: Obstructive sleep apnea Patient presents for a one-year follow-up for sleep apnea.  Patient says overall he is doing well on CPAP.  He wears his CPAP each night.  Does not miss any nights.  Feels rested with no significant daytime sleepiness.  Feels that he benefits from CPAP.  Download shows excellent compliance with average usage at 7.5 hours.  Patient is on AutoSet 11-15 summers H2O.  AHI is 1.1.  Minimum leaks.    No Known Allergies  Immunization History  Administered Date(s) Administered  . Influenza,inj,Quad PF,6+ Mos 05/26/2013, 03/27/2016, 03/29/2018  . Td 01/17/2010  . Tdap 04/18/2013    Past Medical History:  Diagnosis Date  . Acute sinusitis, unspecified   . Dermatophytosis of nail   . Esophageal reflux   . Essential hypertension, benign   . Impotence of organic origin   . Morbid obesity ([place])   . Obstructive sleep apnea (adult) (pediatric)   . Other and unspecified hyperlipidemia   . Other malaise and fatigue   . Other testicular hypofunction   . Pure hypercholesterolemia   . Rosacea   . Routine general medical examination at a health care facility   . Sleep apnea     Tobacco History: Social History   Tobacco Use  Smoking Status Former Smoker  . Packs/day: 1.00  . Years: 15.00  . Pack years: 15.00  . Last attempt to quit: 1995  . Years since quitting: 24.8  Smokeless Tobacco Never Used   Counseling given: Not Answered   Outpatient Medications Prior to Visit  Medication Sig Dispense Refill  . ASPIRIN ADULT LOW STRENGTH 81 MG EC tablet TAKE 1 TABLET BY MOUTH EVERY DAY 90 tablet 3  .  losartan (COZAAR) 100 MG tablet TAKE 1 TABLET BY MOUTH EVERY DAY 90 tablet 1  . nitroGLYCERIN (NITROGLYN) 2 % ointment Use 1 inch of ointment rectally as needed for rectal pain (no more than once daily) 30 g 0  . omeprazole (PRILOSEC) 40 MG capsule TAKE 1 CAPSULE BY MOUTH EVERY DAY 90 capsule 3  . PRESCRIPTION MEDICATION Inhale into the lungs at bedtime. CPAP    . simvastatin (ZOCOR) 40 MG tablet TAKE 1 TABLET BY MOUTH EVERY DAY 90 tablet 3  . triamterene-hydrochlorothiazide (MAXZIDE-25) 37.5-25 MG tablet TAKE 1 TABLET BY MOUTH EVERY DAY 90 tablet 1  . tretinoin (RETIN-A) 0.01 % gel Apply topically at bedtime. (Patient not taking: Reported on 05/20/2018) 45 g 0   Facility-Administered Medications Prior to Visit  Medication Dose Route Frequency Provider Last Rate Last Dose  . 0.9 %  sodium chloride infusion  500 mL Intravenous Once Nandigam, Kavitha V, MD         Review of Systems  Constitutional:   No  weight loss, night sweats,  Fevers, chills, fatigue, or  lassitude.  HEENT:   No headaches,  Difficulty swallowing,  Tooth/dental problems, or  Sore throat,                No sneezing, itching, ear ache, nasal congestion, post nasal drip,   CV:  No chest pain,  Orthopnea, PND, swelling in lower extremities, anasarca, dizziness, palpitations, syncope.   GI  No heartburn, indigestion, abdominal pain, nausea, vomiting, diarrhea, change in bowel habits, loss of appetite, bloody stools.   Resp: No shortness of breath with exertion or at rest.  No excess mucus, no productive cough,  No non-productive cough,  No coughing up of blood.  No change in color of mucus.  No wheezing.  No chest wall deformity  Skin: no rash or lesions.  GU: no dysuria, change in color of urine, no urgency or frequency.  No flank pain, no hematuria   MS:  No joint pain or swelling.  No decreased range of motion.  No back pain.    Physical Exam  BP 128/80 (BP Location: Left Arm, Cuff Size: Large)   Pulse 66   Ht  5\' 10"  (1.778 m)   Wt (!) 328 lb 6.4 oz (149 kg)   SpO2 96%   BMI 47.12 kg/m   GEN: A/Ox3; pleasant , NAD, obese    HEENT:  [place]/AT,  EACs-clear, TMs-wnl, NOSE-clear, THROAT-clear, no lesions, no postnasal drip or exudate noted. Class 3-4 MP airway   NECK:  Supple w/ fair ROM; no JVD; normal carotid impulses w/o bruits; no thyromegaly or nodules palpated; no lymphadenopathy.    RESP  Clear  P & A; w/o, wheezes/ rales/ or rhonchi. no accessory muscle use, no dullness to percussion  CARD:  RRR, no m/r/g, no peripheral edema, pulses intact, no cyanosis or clubbing.  GI:   Soft & nt; nml bowel sounds; no organomegaly or masses detected.   Musco: Warm bil, no deformities or joint swelling noted.   Neuro: alert, no focal deficits noted.    Skin: Warm, no lesions or rashes    Lab Results:   BNP No results found for: BNP  ProBNP No results found for: PROBNP  Imaging: No results found.    No flowsheet data found.  No results found for: NITRICOXIDE      Assessment & Plan:   Obstructive sleep apnea Well-controlled on CPAP   Plan  Patient Instructions  Continue on CPAP At bedtime   Keep up good work .  Work on healthy weight .  Do not drive if sleepy .  Follow up with Dr. Elsworth Soho  In 1 year and As needed      MORBID OBESITY Wt loss      Rexene Edison, NP 05/20/2018

## 2018-05-20 NOTE — Assessment & Plan Note (Signed)
Well-controlled on CPAP   Plan  Patient Instructions  Continue on CPAP At bedtime   Keep up good work .  Work on healthy weight .  Do not drive if sleepy .  Follow up with Dr. Elsworth Soho  In 1 year and As needed

## 2018-05-20 NOTE — Assessment & Plan Note (Signed)
Wt loss  

## 2018-05-20 NOTE — Patient Instructions (Signed)
Continue on CPAP At bedtime .  ?Keep up good work.  ?Work on healthy weight .  ?Do not drive if sleepy  ?Follow up with Dr. Alva  In 1 year and As needed   ? ?

## 2018-05-31 ENCOUNTER — Telehealth: Payer: Self-pay | Admitting: Adult Health

## 2018-05-31 NOTE — Telephone Encounter (Signed)
Per TP: okay to compose letter stating that patient's compliance download looks great - shows excellent compliance and control. Continue same settings.  Thanks.  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

## 2018-05-31 NOTE — Telephone Encounter (Signed)
Spoke with pt, he states he needs something stating he is compliant on his machine and the number of hours he stays on it at night. I printed a download for TP to review. I will place in her folder since she was the last provider to see pt in the office. Please advise.

## 2018-07-07 ENCOUNTER — Other Ambulatory Visit: Payer: Self-pay | Admitting: Family Medicine

## 2018-08-09 ENCOUNTER — Ambulatory Visit: Payer: BC Managed Care – PPO | Admitting: Family Medicine

## 2018-08-09 ENCOUNTER — Encounter: Payer: Self-pay | Admitting: Family Medicine

## 2018-08-09 ENCOUNTER — Telehealth: Payer: Self-pay

## 2018-08-09 DIAGNOSIS — R109 Unspecified abdominal pain: Secondary | ICD-10-CM | POA: Diagnosis not present

## 2018-08-09 NOTE — Progress Notes (Signed)
R sided abd pain.  Noted some discomfort with walking yesterday.  Sore but not really painful.  No pain at rest.  "It feels like there's a ball there" when walking.   He doesn't feel a bulge o/w.  No L sided sx.    H/o abd surgery in childhood in the area.  No trauma, no trigger.   No FCNAVD.   No blood in stools. Normal BMs.  No dysuria.   Meds, vitals, and allergies reviewed.   ROS: Per HPI unless specifically indicated in ROS section   GEN: nad, alert and oriented HEENT: mucous membranes moist NECK: supple w/o LA CV: rrr. PULM: ctab, no inc wob ABD: soft, +bs, he has an old vertical scar to the right of the umbilicus.  The inferior portion of the scar is slightly tender without palpable mass or bulge that I can feel.  He has some discomfort locally there when he coughs but when I apply external pressure and then he coughs he has less pain.  I do not feel a distinct hernia. EXT: no edema SKIN: Well-perfused

## 2018-08-09 NOTE — Telephone Encounter (Signed)
Since 08/08/18 rt side pain that is worse when he moves around, no fever,no diarrhea or constipation, no N&V,no injury. Pain level now is 4-5. Pt is not sure if still has appendix or not (had sometype surgery as small child)the patient did not feel like working today. Pt scheduled 30' appt with Dr Damita Dunnings today at 2:15. If pt condition changes or worsens prior to appt pt will go to ED. FYI to Dr Damita Dunnings.

## 2018-08-09 NOTE — Telephone Encounter (Signed)
Noted. Thanks.

## 2018-08-09 NOTE — Patient Instructions (Signed)
Likely an abdominal wall strain.   This should gradually get better.  Relative rest, use a belt if needed, and take tylenol if needed for pain.  If you notice a discrete mass or bulge, then let us know.   Take care.  Glad to see you.

## 2018-08-10 DIAGNOSIS — R109 Unspecified abdominal pain: Secondary | ICD-10-CM | POA: Insufficient documentation

## 2018-08-10 NOTE — Assessment & Plan Note (Signed)
This does not look like an intra-abdominal process.  Discussed.  Okay for outpatient follow-up. Likely an abdominal wall strain.   This should gradually get better.  Relative rest, use a belt if needed, and take tylenol if needed for pain.  If he notices a discrete mass or bulge, then he will let us know.

## 2018-09-06 ENCOUNTER — Encounter: Payer: Self-pay | Admitting: Family Medicine

## 2018-09-06 ENCOUNTER — Ambulatory Visit: Payer: BC Managed Care – PPO | Admitting: Family Medicine

## 2018-09-06 VITALS — BP 118/60 | HR 65 | Temp 98.4°F | Ht 70.0 in | Wt 341.0 lb

## 2018-09-06 DIAGNOSIS — J029 Acute pharyngitis, unspecified: Secondary | ICD-10-CM

## 2018-09-06 LAB — POCT RAPID STREP A (OFFICE): RAPID STREP A SCREEN: NEGATIVE

## 2018-09-06 NOTE — Patient Instructions (Signed)
Rest, fluids.  Can use ibuprofen or tylenol for sore throat and body aches.

## 2018-09-06 NOTE — Assessment & Plan Note (Signed)
Neg strep, symptomatic care

## 2018-09-06 NOTE — Progress Notes (Signed)
Subjective:    Patient ID: Todd Owens, male    DOB: April 03, 1966, 53 y.o.   MRN: [id number]  Sore Throat   This is a new problem. The current episode started in the past 7 days. The problem has been gradually worsening. Neither side of throat is experiencing more pain than the other. Maximum temperature: ? fever. The pain is moderate. Associated symptoms include headaches. Pertinent negatives include no coughing, diarrhea, ear pain or shortness of breath. Associated symptoms comments: Chills, possible fever   no ear pain, no sinus pressure  mild congestion. He has had no exposure to strep or mono. Treatments tried: theraflu. The treatment provided mild relief.    Blood pressure 118/60, pulse 65, temperature 98.4 F (36.9 C), temperature source Oral, height 5\' 10"  (1.778 m), weight (!) 341 lb (154.7 kg), SpO2 95 %. Social History /Family History/Past Medical History reviewed in detail and updated in EMR if needed.   Review of Systems  HENT: Negative for ear pain.   Respiratory: Negative for cough and shortness of breath.   Gastrointestinal: Negative for diarrhea.  Neurological: Positive for headaches.       Objective:   Physical Exam Constitutional:      General: He is not in acute distress.    Appearance: Normal appearance. He is well-developed. He is obese. He is not ill-appearing or toxic-appearing.  HENT:     Head: Normocephalic and atraumatic.     Right Ear: Hearing, tympanic membrane, ear canal and external ear normal. No tenderness. No foreign body. Tympanic membrane is not retracted or bulging.     Left Ear: Hearing, tympanic membrane, ear canal and external ear normal. No tenderness. No foreign body. Tympanic membrane is not retracted or bulging.     Nose: Nose normal. No mucosal edema or rhinorrhea.     Right Sinus: No maxillary sinus tenderness or frontal sinus tenderness.     Left Sinus: No maxillary sinus tenderness or frontal sinus tenderness.     Mouth/Throat:    Mouth: Mucous membranes are moist.     Dentition: Normal dentition. No dental caries.     Pharynx: Uvula midline. Posterior oropharyngeal erythema present. No pharyngeal swelling or oropharyngeal exudate.     Tonsils: No tonsillar abscesses.  Eyes:     General: Lids are normal. Lids are everted, no foreign bodies appreciated.     Conjunctiva/sclera: Conjunctivae normal.     Pupils: Pupils are equal, round, and reactive to light.  Neck:     Musculoskeletal: Normal range of motion and neck supple.     Thyroid: No thyroid mass or thyromegaly.     Vascular: No carotid bruit.     Trachea: Trachea and phonation normal.  Cardiovascular:     Rate and Rhythm: Normal rate and regular rhythm.     Pulses: Normal pulses.     Heart sounds: Normal heart sounds, S1 normal and S2 normal. No murmur. No gallop.   Pulmonary:     Effort: Pulmonary effort is normal. No respiratory distress.     Breath sounds: Normal breath sounds. No wheezing, rhonchi or rales.  Abdominal:     General: Bowel sounds are normal.     Palpations: Abdomen is soft.     Tenderness: There is no abdominal tenderness. There is no guarding or rebound.     Hernia: No hernia is present.  Skin:    General: Skin is warm and dry.     Findings: No rash.  Neurological:  Mental Status: He is alert.     Deep Tendon Reflexes: Reflexes are normal and symmetric.  Psychiatric:        Speech: Speech normal.        Behavior: Behavior normal.        Judgment: Judgment normal.           Assessment & Plan:

## 2018-09-07 ENCOUNTER — Other Ambulatory Visit: Payer: Self-pay | Admitting: Family Medicine

## 2018-10-11 ENCOUNTER — Telehealth: Payer: Self-pay | Admitting: *Deleted

## 2018-10-11 ENCOUNTER — Ambulatory Visit: Payer: BC Managed Care – PPO | Admitting: Family Medicine

## 2018-10-11 ENCOUNTER — Encounter: Payer: Self-pay | Admitting: Family Medicine

## 2018-10-11 ENCOUNTER — Other Ambulatory Visit: Payer: Self-pay

## 2018-10-11 DIAGNOSIS — M25561 Pain in right knee: Secondary | ICD-10-CM | POA: Insufficient documentation

## 2018-10-11 MED ORDER — DICLOFENAC SODIUM 1 % TD GEL: 4.0000 g | Freq: Four times a day (QID) | TRANSDERMAL | 2 refills | Status: DC

## 2018-10-11 NOTE — Progress Notes (Signed)
   Subjective:    Patient ID: Todd Owens, male    DOB: 05-25-1966, 53 y.o.   MRN: [id number]  Knee Pain   The incident occurred more than 1 week ago. Incident location: no known fall. There was no injury mechanism. The pain is present in the right knee ( pain below knee cap bilaterally, when siots for a while hurts behind knee). The quality of the pain is described as aching. The pain is at a severity of 5/10. The pain is moderate. The pain has been worsening since onset. Associated symptoms include a loss of motion and muscle weakness. Pertinent negatives include no inability to bear weight, loss of sensation, numbness or tingling. Associated symptoms comments: Decreased flexion. He reports no foreign bodies present. The symptoms are aggravated by movement. He has tried acetaminophen, rest and NSAIDs for the symptoms. The treatment provided mild relief.    NSAIDS contraindicated give  Hear tissues.  No swelling , no redness.  HX of ACL tear left knee.. s/p repair Social History /Family History/Past Medical History reviewed in detail and updated in EMR if needed. Blood pressure 120/60, pulse 78, temperature 98.3 F (36.8 C), temperature source Oral, height 5\' 10"  (1.778 m), weight (!) 341 lb (154.7 kg).  Review of Systems  Constitutional: Negative for fatigue and fever.  HENT: Negative for ear pain.   Eyes: Negative for pain.  Respiratory: Negative for cough and shortness of breath.   Cardiovascular: Negative for chest pain, palpitations and leg swelling.  Gastrointestinal: Negative for abdominal pain.  Genitourinary: Negative for dysuria.  Musculoskeletal: Positive for arthralgias.  Neurological: Negative for tingling, syncope, light-headedness, numbness and headaches.  Psychiatric/Behavioral: Negative for dysphoric mood.       Objective:   Physical Exam Constitutional:      Appearance: He is well-developed. He is obese.  HENT:     Head: Normocephalic.     Right Ear: Hearing  normal.     Left Ear: Hearing normal.     Nose: Nose normal.  Neck:     Thyroid: No thyroid mass or thyromegaly.     Vascular: No carotid bruit.     Trachea: Trachea normal.  Cardiovascular:     Rate and Rhythm: Normal rate and regular rhythm.     Pulses: Normal pulses.     Heart sounds: Heart sounds not distant. No murmur. No friction rub. No gallop.      Comments: No peripheral edema Pulmonary:     Effort: Pulmonary effort is normal. No respiratory distress.     Breath sounds: Normal breath sounds.  Musculoskeletal:     Right knee: He exhibits decreased range of motion. He exhibits no swelling, no effusion, no bony tenderness and normal meniscus. Tenderness found. Medial joint line tenderness noted. No patellar tendon tenderness noted.     Comments: Mild swelling in right ankle compared to left  Skin:    General: Skin is warm and dry.     Findings: No rash.  Psychiatric:        Speech: Speech normal.        Behavior: Behavior normal.        Thought Content: Thought content normal.           Assessment & Plan:

## 2018-10-11 NOTE — Assessment & Plan Note (Signed)
No sign of Acute injury. Most likely OA pain. Treat with topical diclofenac gel as NSAIDs high risk for this pt.  Start ice, elevation and home rehab exercises given.

## 2018-10-11 NOTE — Patient Instructions (Addendum)
Elevated knee.  Ice.  Start home rehab exercises. Apply topical  diclofenac gel.  NO repetitive bending, squatting , twisting of knee.  Call if not improving in 2 weeks.

## 2018-10-11 NOTE — Telephone Encounter (Signed)
Received fax from [facility] requesting PA for Diclofenac Gel 1%.  PA completed on CoverMyMeds. Sent for review.  Can take up to 72 hours for a decision.

## 2018-10-11 NOTE — Telephone Encounter (Signed)
PA approved from 10/11/2018 through 10/10/2021.  [facility] notified of approval via fax.

## 2018-11-25 ENCOUNTER — Telehealth: Payer: Self-pay

## 2018-11-25 NOTE — Telephone Encounter (Signed)
Called patient from recall.  No answer. LMOV.  This is the second attempt per recall list.

## 2018-11-30 NOTE — Telephone Encounter (Signed)
Called patient from recall list.  Patient states he is not having any symptoms at this time and would like to wait for an in office appointment.  Deleted old recall. NEW recall placed for AUG.

## 2018-12-02 ENCOUNTER — Telehealth: Payer: Self-pay | Admitting: Family Medicine

## 2018-12-02 DIAGNOSIS — E221 Hyperprolactinemia: Secondary | ICD-10-CM

## 2018-12-02 DIAGNOSIS — E785 Hyperlipidemia, unspecified: Secondary | ICD-10-CM

## 2018-12-02 DIAGNOSIS — R7303 Prediabetes: Secondary | ICD-10-CM

## 2018-12-02 DIAGNOSIS — Z125 Encounter for screening for malignant neoplasm of prostate: Secondary | ICD-10-CM

## 2018-12-02 NOTE — Telephone Encounter (Signed)
-----   Message from Cloyd Stagers, RT sent at 12/02/2018  1:15 PM EDT ----- Regarding: Lab Orders for Wednesday 5.20.2020 Please place lab orders for Wednesday 5.20.2020, in office visit on Friday 5.22.2020  Thank you, Dyke Maes RT(R)

## 2018-12-07 ENCOUNTER — Other Ambulatory Visit (INDEPENDENT_AMBULATORY_CARE_PROVIDER_SITE_OTHER): Payer: BC Managed Care – PPO

## 2018-12-07 ENCOUNTER — Other Ambulatory Visit: Payer: BC Managed Care – PPO

## 2018-12-07 DIAGNOSIS — E221 Hyperprolactinemia: Secondary | ICD-10-CM | POA: Diagnosis not present

## 2018-12-07 DIAGNOSIS — Z125 Encounter for screening for malignant neoplasm of prostate: Secondary | ICD-10-CM

## 2018-12-07 DIAGNOSIS — R7303 Prediabetes: Secondary | ICD-10-CM

## 2018-12-07 LAB — LIPID PANEL
Cholesterol: 125 mg/dL (ref 0–200)
HDL: 34.2 mg/dL — ABNORMAL LOW (ref >39.00)
LDL Cholesterol: 77 mg/dL (ref 0–99)
NonHDL: 90.41
Total CHOL/HDL Ratio: 4
Triglycerides: 69 mg/dL (ref 0.0–149.0)
VLDL: 13.8 mg/dL (ref 0.0–40.0)

## 2018-12-07 LAB — COMPREHENSIVE METABOLIC PANEL
ALT: 19 U/L (ref 0–53)
AST: 19 U/L (ref 0–37)
Albumin: 3.7 g/dL (ref 3.5–5.2)
Alkaline Phosphatase: 55 U/L (ref 39–117)
BUN: 14 mg/dL (ref 6–23)
CO2: 33 mEq/L — ABNORMAL HIGH (ref 19–32)
Calcium: 8.7 mg/dL (ref 8.4–10.5)
Chloride: 100 mEq/L (ref 96–112)
Creatinine, Ser: 0.94 mg/dL (ref 0.40–1.50)
GFR: 84.03 mL/min (ref >60.00)
Glucose, Bld: 114 mg/dL — ABNORMAL HIGH (ref 70–99)
Potassium: 3.7 mEq/L (ref 3.5–5.1)
Sodium: 139 mEq/L (ref 135–145)
Total Bilirubin: 0.4 mg/dL (ref 0.2–1.2)
Total Protein: 6.4 g/dL (ref 6.0–8.3)

## 2018-12-07 LAB — PSA: PSA: 0.24 ng/mL (ref 0.10–4.00)

## 2018-12-07 LAB — HEMOGLOBIN A1C: Hgb A1c MFr Bld: 6.3 % (ref 4.6–6.5)

## 2018-12-08 LAB — PROLACTIN: Prolactin: 18.2 ng/mL — ABNORMAL HIGH (ref 2.0–18.0)

## 2018-12-09 ENCOUNTER — Ambulatory Visit (INDEPENDENT_AMBULATORY_CARE_PROVIDER_SITE_OTHER): Payer: BC Managed Care – PPO | Admitting: Family Medicine

## 2018-12-09 ENCOUNTER — Other Ambulatory Visit: Payer: Self-pay

## 2018-12-09 ENCOUNTER — Other Ambulatory Visit: Payer: Self-pay | Admitting: Family Medicine

## 2018-12-09 ENCOUNTER — Encounter: Payer: Self-pay | Admitting: Family Medicine

## 2018-12-09 VITALS — BP 110/70 | HR 66 | Temp 98.5°F | Ht 70.0 in | Wt 337.0 lb

## 2018-12-09 DIAGNOSIS — E785 Hyperlipidemia, unspecified: Secondary | ICD-10-CM

## 2018-12-09 DIAGNOSIS — G4733 Obstructive sleep apnea (adult) (pediatric): Secondary | ICD-10-CM | POA: Diagnosis not present

## 2018-12-09 DIAGNOSIS — I10 Essential (primary) hypertension: Secondary | ICD-10-CM

## 2018-12-09 DIAGNOSIS — R7303 Prediabetes: Secondary | ICD-10-CM

## 2018-12-09 DIAGNOSIS — Z0000 Encounter for general adult medical examination without abnormal findings: Secondary | ICD-10-CM | POA: Diagnosis not present

## 2018-12-09 DIAGNOSIS — M722 Plantar fascial fibromatosis: Secondary | ICD-10-CM

## 2018-12-09 MED ORDER — DICLOFENAC SODIUM 75 MG PO TBEC: 75.0000 mg | DELAYED_RELEASE_TABLET | Freq: Two times a day (BID) | ORAL | 0 refills | Status: DC

## 2018-12-09 NOTE — Assessment & Plan Note (Signed)
Well controlled. Continue current medication. Encouraged exercise, weight loss, healthy eating habits.  

## 2018-12-09 NOTE — Assessment & Plan Note (Signed)
Well controlled. Continue current medication.  

## 2018-12-09 NOTE — Assessment & Plan Note (Signed)
NSAIDs , ice massage, home PT. INfo given.

## 2018-12-09 NOTE — Assessment & Plan Note (Signed)
Gradual worsening... Encouraged exercise weight loss and low carb diet. Stop soda and juice.

## 2018-12-09 NOTE — Patient Instructions (Addendum)
Watch carbs in diet... decrease sugary drinks.  Start home PT for plantar fasciitis.  Start diclofenac twice daily  X 2 weeks.  Call for follow up if not improving.

## 2018-12-09 NOTE — Progress Notes (Signed)
Chief Complaint  Patient presents with  . Annual Exam  . Right Heel Pain    History of Present Illness: HPI The patient presents for complete physical and review of chronic health problems. He/She also has the following acute concerns today: right heel pain off and on for years.. worse in last few weeks.  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 Pushing foot forward hurts.. using ball to massage, no ice.   Pain is in right posterior heel, worst when wakes up.  No fall.  Hypertension:   Good control on losartan  BP Readings from Last 3 Encounters:  12/09/18 110/70  10/11/18 120/60  09/06/18 118/60  Using medication without problems or lightheadedness: none Chest pain with exertion:none Edema:none Short of breath:none Average home BPs: Other issues:  Wt Readings from Last 3 Encounters:  12/09/18 (!) 337 lb (152.9 kg)  10/11/18 (!) 341 lb (154.7 kg)  09/06/18 (!) 341 lb (154.7 kg)    Sleep apnea: on CPAP.   Elevated Cholesterol:  LDL at goal on simvastatin Lab Results  Component Value Date   CHOL 125 12/07/2018   HDL 34.20 (L) 12/07/2018   LDLCALC 77 12/07/2018   TRIG 69.0 12/07/2018   CHOLHDL 4 12/07/2018  Using medications without problems: Muscle aches:  Diet compliance: not eatingout Exercise: walking at work a lot Other complaints:  prediabetes:  Slight worsening.. some juice and soda.  COVID 19 screen No recent travel or known exposure to COVID19 The patient denies respiratory symptoms of COVID 19 at this time.  The .hnt importance of social distancing was discussed today.   Review of Systems  Constitutional: Negative for chills and fever.  HENT: Negative for congestion and ear pain.   Eyes: Negative for pain and redness.  Respiratory: Negative for cough and shortness of breath.   Cardiovascular: Negative for chest pain, palpitations and leg swelling.  Gastrointestinal: Negative for abdominal pain, blood in stool, constipation, diarrhea, nausea and vomiting.   Genitourinary: Negative for dysuria.  Musculoskeletal: Negative for falls and myalgias.  Skin: Negative for rash.  Neurological: Negative for dizziness.  Psychiatric/Behavioral: Negative for depression. The patient is not nervous/anxious.       Past Medical History:  Diagnosis Date  . Acute sinusitis, unspecified   . Dermatophytosis of nail   . Esophageal reflux   . Essential hypertension, benign   . Impotence of organic origin   . Morbid obesity ([place])   . Obstructive sleep apnea (adult) (pediatric)   . Other and unspecified hyperlipidemia   . Other malaise and fatigue   . Other testicular hypofunction   . Pure hypercholesterolemia   . Rosacea   . Routine general medical examination at a health care facility   . Sleep apnea     reports that he quit smoking about 25 years ago. He has a 15.00 pack-year smoking history. He has never used smokeless tobacco. He reports that he does not drink alcohol or use drugs.   Current Outpatient Medications:  .  ASPIRIN ADULT LOW STRENGTH 81 MG EC tablet, TAKE 1 TABLET BY MOUTH EVERY DAY, Disp: 90 tablet, Rfl: 3 .  diclofenac sodium (VOLTAREN) 1 % GEL, Apply 4 g topically 4 (four) times daily. Pt in on aspirin and NSAIDs orally not recommended given cardiac issues., Disp: 100 g, Rfl: 2 .  losartan (COZAAR) 100 MG tablet, TAKE 1 TABLET BY MOUTH EVERY DAY, Disp: 90 tablet, Rfl: 1 .  omeprazole (PRILOSEC) 40 MG capsule, TAKE 1 CAPSULE BY MOUTH EVERY DAY,  Disp: 90 capsule, Rfl: 3 .  PRESCRIPTION MEDICATION, Inhale into the lungs at bedtime. CPAP, Disp: , Rfl:  .  simvastatin (ZOCOR) 40 MG tablet, TAKE 1 TABLET BY MOUTH EVERY DAY, Disp: 90 tablet, Rfl: 3 .  triamterene-hydrochlorothiazide (MAXZIDE-25) 37.5-25 MG tablet, TAKE 1 TABLET BY MOUTH EVERY DAY, Disp: 90 tablet, Rfl: 1   Observations/Objective: Blood pressure 110/70, pulse 66, temperature 98.5 F (36.9 C), temperature source Oral, height 5\' 10"  (1.778 m), weight (!) 337 lb (152.9 kg), SpO2  92 %.  Physical Exam Constitutional:      General: He is not in acute distress.    Appearance: Normal appearance. He is well-developed. He is not ill-appearing or toxic-appearing.  HENT:     Head: Normocephalic and atraumatic.     Right Ear: Hearing, tympanic membrane, ear canal and external ear normal.     Left Ear: Hearing, tympanic membrane, ear canal and external ear normal.     Nose: Nose normal.     Mouth/Throat:     Pharynx: Uvula midline.  Eyes:     General: Lids are normal. Lids are everted, no foreign bodies appreciated.     Conjunctiva/sclera: Conjunctivae normal.     Pupils: Pupils are equal, round, and reactive to light.  Neck:     Musculoskeletal: Normal range of motion and neck supple.     Thyroid: No thyroid mass or thyromegaly.     Vascular: No carotid bruit.     Trachea: Trachea and phonation normal.  Cardiovascular:     Rate and Rhythm: Normal rate and regular rhythm.     Pulses: Normal pulses.          Dorsalis pedis pulses are 2+ on the right side and 2+ on the left side.     Heart sounds: S1 normal and S2 normal. No murmur. No gallop.   Pulmonary:     Breath sounds: Normal breath sounds. No wheezing, rhonchi or rales.  Abdominal:     General: Bowel sounds are normal.     Palpations: Abdomen is soft.     Tenderness: There is no abdominal tenderness. There is no guarding or rebound.     Hernia: No hernia is present.  Musculoskeletal:     Right foot: Normal range of motion. No deformity or bunion.     Left foot: Normal range of motion. No deformity or bunion.  Feet:     Right foot:     Protective Sensation: 2 sites tested.     Skin integrity: Skin integrity normal.     Left foot:     Protective Sensation: 2 sites tested.     Skin integrity: Skin integrity normal.     Comments: ttp in right heel over insertion of plantar fascia. Lymphadenopathy:     Cervical: No cervical adenopathy.  Skin:    General: Skin is warm and dry.     Findings: No rash.   Neurological:     Mental Status: He is alert.     Cranial Nerves: No cranial nerve deficit.     Sensory: No sensory deficit.     Gait: Gait normal.     Deep Tendon Reflexes: Reflexes are normal and symmetric.  Psychiatric:        Speech: Speech normal.        Behavior: Behavior normal.        Judgment: Judgment normal.      Assessment and Plan The patient's preventative maintenance and recommended screening tests for an annual wellness  exam were reviewed in full today. Brought up to date unless services declined.  Counselled on the importance of diet, exercise, and its role in overall health and mortality. The patient's FH and SH was reviewed, including their home life, tobacco status, and drug and alcohol status.   Vaccine: Td 2014. Former smoker, Quit 15-20 years ago Prostate,  Lab Results  Component Value Date   PSA 0.24 12/07/2018   PSA 0.31 12/03/2017  Colon cancer screen:  Tubular adenoma 02/18/2018.. plan repeat in 5 years, Dr. Silverio Decamp. No STD testing requested. ZYY:QMGNOIB Hep C:not indicated. No genital exam.       Eliezer Lofts, MD

## 2018-12-09 NOTE — Assessment & Plan Note (Signed)
Continue stable control on CPAP

## 2019-01-02 ENCOUNTER — Other Ambulatory Visit: Payer: Self-pay | Admitting: Family Medicine

## 2019-01-09 ENCOUNTER — Telehealth: Payer: Self-pay

## 2019-01-09 ENCOUNTER — Ambulatory Visit (INDEPENDENT_AMBULATORY_CARE_PROVIDER_SITE_OTHER)
Admission: RE | Admit: 2019-01-09 | Discharge: 2019-01-09 | Disposition: A | Discharge status: Home / Self Care | Payer: BC Managed Care – PPO | Source: Ambulatory Visit | Attending: Family Medicine | Admitting: Family Medicine

## 2019-01-09 ENCOUNTER — Encounter: Payer: Self-pay | Admitting: Family Medicine

## 2019-01-09 ENCOUNTER — Ambulatory Visit: Payer: BC Managed Care – PPO | Admitting: Family Medicine

## 2019-01-09 ENCOUNTER — Other Ambulatory Visit: Payer: Self-pay

## 2019-01-09 VITALS — BP 104/62 | HR 57 | Temp 97.3°F | Ht 70.0 in | Wt 336.8 lb

## 2019-01-09 DIAGNOSIS — M25562 Pain in left knee: Secondary | ICD-10-CM

## 2019-01-09 DIAGNOSIS — M25462 Effusion, left knee: Secondary | ICD-10-CM

## 2019-01-09 MED ORDER — METHYLPREDNISOLONE ACETATE 40 MG/ML IJ SUSP
80.0000 mg | Freq: Once | INTRAMUSCULAR | Status: AC
  Administered 2019-01-09: 80 mg via INTRA_ARTICULAR

## 2019-01-09 MED ORDER — DICLOFENAC SODIUM 75 MG PO TBEC: 75.0000 mg | DELAYED_RELEASE_TABLET | Freq: Two times a day (BID) | ORAL | 3 refills | Status: DC

## 2019-01-09 NOTE — Telephone Encounter (Signed)
Pt has in office appt already scheduled 01/09/19 at 11 AM with Dr Lorelei Pont,

## 2019-01-09 NOTE — Progress Notes (Signed)
Todd Owens T. [name], MD [facility] and [place] [facility] [place], [address] Phone: [phone]   FAX: [place] - 53 y.o. male   MRN [id number]   Date of Birth: January 06, 1966  Visit Date: 01/09/2019   PCP: Jinny Sanders, MD   Referred by: Jinny Sanders, MD  Chief Complaint  Patient presents with   Knee Pain    Left   Subjective:   Todd Owens is a 53 y.o. very pleasant male patient who presents with the following:  L knee pain:  Friday before last, noticed in the back part of his knee, felt lieke it was tight and the whole knee was in pain.  Put some voltaren gel on the knee.  Had one or two days and then it would come back . This has been ongoing for a few weeks. Body mass index is 48.32 kg/m.   He did not really have any kind of acute knee pain that he can recall, but he did have some pain when he woke up, and he does have some chronic knee pain in general.  Asp and inj ? gout  ACL reconstruction.  Surgeon.   Past Medical History, Surgical History, Social History, Family History, Problem List, Medications, and Allergies have been reviewed and updated if relevant.  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   ONYCHOMYCOSIS, BILATERAL 09/14/2008   GERD 09/14/2008   Obstructive sleep apnea 11/25/2007    Essential hypertension, benign 11/14/2007   MORBID OBESITY 10/07/2007   ACNE, ROSACEA 10/07/2007   Hyperlipidemia 10/03/2007    Past Medical History:  Diagnosis Date   Acute sinusitis, unspecified    Dermatophytosis of nail    Esophageal reflux    Essential hypertension, benign    Impotence of organic origin    Morbid obesity ([place])    Obstructive sleep apnea (adult) (pediatric)    Other and unspecified hyperlipidemia    Other malaise and fatigue    Other testicular hypofunction    Pure hypercholesterolemia    Rosacea    Routine general medical examination at a health care facility    Sleep apnea     Past Surgical History:  Procedure Laterality Date   BACK SURGERY  2003   cardiolyte  2001   neg   KNEE SURGERY  2005   PFT  2007   WNL   WRIST SURGERY Right     Social History   Socioeconomic History   Marital status: Married    Spouse name: Not on file   Number of children: Not on file   Years of education: Not on file   Highest education level: Not on file  Occupational History   Not on file  Social Needs   Financial resource strain: Not on file   Food insecurity    Worry: Not on file    Inability: Not on  file   Transportation needs    Medical: Not on file    Non-medical: Not on file  Tobacco Use   Smoking status: Former Smoker    Packs/day: 1.00    Years: 15.00    Pack years: 15.00    Quit date: 1995    Years since quitting: 25.4   Smokeless tobacco: Never Used  Substance and Sexual Activity   Alcohol use: No   Drug use: No   Sexual activity: Yes  Lifestyle   Physical activity    Days per week: Not on file    Minutes per session: Not on file   Stress: Not on file  Relationships   Social connections    Talks on phone: Not on file    Gets together: Not on file    Attends religious service: Not on file    Active member of club or organization: Not on file    Attends meetings of clubs or organizations: Not on  file    Relationship status: Not on file   Intimate partner violence    Fear of current or ex partner: Not on file    Emotionally abused: Not on file    Physically abused: Not on file    Forced sexual activity: Not on file  Other Topics Concern   Not on file  Social History Narrative   Not on file    Family History  Problem Relation Age of Onset   Hypertension Mother    Heart disease Mother        pacemaker   Hypertension Father    Stroke Father    Heart disease Father 51   Heart attack Father    Heart disease Sister    Heart attack Sister 30   Heart failure Sister    Colon cancer Neg Hx    Esophageal cancer Neg Hx    Liver cancer Neg Hx    Pancreatic cancer Neg Hx    Stomach cancer Neg Hx    Rectal cancer Neg Hx     No Known Allergies  Medication list reviewed and updated in full in [place].  GEN: No fevers, chills. Nontoxic. Primarily MSK c/o today. MSK: Detailed in the HPI GI: tolerating PO intake without difficulty Neuro: No numbness, parasthesias, or tingling associated. Otherwise the pertinent positives of the ROS are noted above.   Objective:   BP 104/62    Pulse (!) 57    Temp (!) 97.3 F (36.3 C) (Other (Comment))    Ht 5\' 10"  (1.778 m)    Wt (!) 336 lb 12 oz (152.7 kg)    BMI 48.32 kg/m    GEN: WDWN, NAD, Non-toxic, Alert & Oriented x 3 HEENT: Atraumatic, Normocephalic.  Ears and Nose: No external deformity. EXTR: No clubbing/cyanosis/edema NEURO: Normal gait.  PSYCH: Normally interactive. Conversant. Not depressed or anxious appearing.  Calm demeanor.   Knee:  LEFT Gait: Normal heel toe pattern ROM: 0-115 Effusion: mod Echymosis or edema: none Patellar tendon NT Painful PLICA: neg Patellar grind: negative Medial and lateral patellar facet loading: negative medial and lateral joint lines: medial >> lateral Mcmurray's neg Flexion-pinch neg Varus and valgus stress: stable Lachman: neg Ant and Post drawer:  neg Hip abduction, IR, ER: WNL Hip flexion str: 5/5 Hip abd: 5/5 Quad: 5/5 VMO atrophy:No Hamstring concentric and eccentric: 5/5   Radiology: Dg Knee 4 Views W/patella Left  Result Date: 01/09/2019 CLINICAL DATA:  Acute on chronic left knee pain. EXAM: LEFT KNEE -  COMPLETE 4+ VIEW COMPARISON:  10/27/2003 FINDINGS: No evidence of acute fracture. Again seen is patella [place]. The joint spaces are preserved. Mild osteophytosis of the tibial spines. Moderate enthesophyte formation about the patella. IMPRESSION: 1. No acute fracture or dislocation identified about the left knee. 2. Patella [place]. 3. Mild osteophytosis of the tibial spines. Moderate enthesophyte formation about the patella. Electronically Signed   By: Fidela Salisbury M.D.   On: 01/09/2019 16:36     Assessment and Plan:     ICD-10-CM   1. Acute pain of left knee  M25.562 DG Knee 4 Views W/Patella Left    Synovial cell count + diff, w/ crystals    Protein, Synovial Fluid    Glucose, synovial fluid    methylPREDNISolone acetate (DEPO-MEDROL) injection 80 mg  2. Effusion of left knee joint  M25.462 Synovial cell count + diff, w/ crystals    Protein, Synovial Fluid    Glucose, synovial fluid    methylPREDNISolone acetate (DEPO-MEDROL) injection 80 mg   Acute knee pain, chronic knee pain with also an acute effusion.  Treat as below, and also aspirate fluid to see if the patient has gout or CPPD crystals.  Aspiration/Injection Procedure Note Todd Owens 22-May-1966 Date of procedure: 01/09/2019  Procedure: Large Joint Aspiration / Injection with synovial fluid aspiration of knee Indications: Pain  Procedure Details Patient verbally consented; risks, benefits, and alternatives explained including possible infection. Patient prepped with Chloraprep. Ethyl chloride for anesthesia. 10 cc of 1% Lidocaine used in wheal then injected Subcutaneous fashion with 22 gauge needle on lateral approach. Under sterilne conditions, 18  gauge needle used via lateral approach to aspirate 15 cc of serosanguinous fluid. Then 8 cc of Lidocaine 1% and 2 mL of Depo-Medrol 40 mg injected. Tolerated well, decreased pain, no complications. Medication: 2 mL of Depo-Medrol 40 mg, equaling Depo-Medrol 80 mg total   Follow-up: No follow-ups on file.  Meds ordered this encounter  Medications   diclofenac (VOLTAREN) 75 MG EC tablet    Sig: Take 1 tablet (75 mg total) by mouth 2 (two) times daily.    Dispense:  60 tablet    Refill:  3   methylPREDNISolone acetate (DEPO-MEDROL) injection 80 mg   Orders Placed This Encounter  Procedures   DG Knee 4 Views W/Patella Left   Synovial cell count + diff, w/ crystals   Protein, Synovial Fluid   Glucose, synovial fluid    Signed,  Kess Mcilwain T. Adolfo Granieri, MD   Outpatient Encounter Medications as of 01/09/2019  Medication Sig   ASPIRIN ADULT LOW STRENGTH 81 MG EC tablet TAKE 1 TABLET BY MOUTH EVERY DAY   diclofenac (VOLTAREN) 75 MG EC tablet TAKE 1 TABLET BY MOUTH TWICE A DAY   diclofenac sodium (VOLTAREN) 1 % GEL Apply 4 g topically 4 (four) times daily. Pt in on aspirin and NSAIDs orally not recommended given cardiac issues.   losartan (COZAAR) 100 MG tablet TAKE 1 TABLET BY MOUTH EVERY DAY   omeprazole (PRILOSEC) 40 MG capsule TAKE 1 CAPSULE BY MOUTH EVERY DAY   PRESCRIPTION MEDICATION Inhale into the lungs at bedtime. CPAP   simvastatin (ZOCOR) 40 MG tablet TAKE 1 TABLET BY MOUTH EVERY DAY   triamterene-hydrochlorothiazide (MAXZIDE-25) 37.5-25 MG tablet TAKE 1 TABLET BY MOUTH EVERY DAY   diclofenac (VOLTAREN) 75 MG EC tablet Take 1 tablet (75 mg total) by mouth 2 (two) times daily.   [EXPIRED] methylPREDNISolone acetate (DEPO-MEDROL) injection 80 mg    No facility-administered encounter medications on  file as of 01/09/2019.

## 2019-01-09 NOTE — Telephone Encounter (Signed)
[facility] - Client TELEPHONE ADVICE RECORD [facility] Patient Name: Todd Owens Gender: Male DOB: 11/09/1965 Age: 53 Y 10 M 4 D Return Phone Number: [phone] (Primary) [place] [address] Client [facility] - Client Client Site [place] Physician Eliezer Lofts - MD Contact Type Call Who Is Calling Patient / Member / Family / Caregiver Call Type Triage / Clinical Relationship To Patient Self Return Phone Number [phone] (Primary) Chief Complaint Leg Pain Reason for Call Symptomatic / Request for [place] states has been having trouble with left knee, feels tight where it bends and is hard to walk. Translation No Nurse Assessment Nurse: Sherrell Puller, RN, Amy Date/Time Eilene Ghazi Time): 01/09/2019 8:33:18 AM Confirm and document reason for call. If symptomatic, describe symptoms. ---Caller states his left knee is feeling very tight with bending it, each side of knee has lots of pain to where he can barely touch it. No known injury. Pain level this morning 8/10. Was unable to work today due to the pain. No redness or swelling, no fevers. Has been putting a cream on it that was given to him by his doctor for his other knee and it sometimes eases it off for just a few minutes and than comes right back. Has the patient had close contact with a person known or suspected to have the novel coronavirus illness OR traveled / lives in area with major community spread (including international travel) in the last 14 days from the onset of symptoms? * If Asymptomatic, screen for exposure and travel within the last 14 days. ---No Does the patient have any new or worsening symptoms? ---Yes Will a triage be completed? ---Yes Related visit to physician within the last 2 weeks? ---No Does the PT have any chronic conditions? (i.e.  diabetes, asthma, this includes High risk factors for pregnancy, etc.) ---No Is this a behavioral health or substance abuse call? ---No Guidelines Guideline Title Affirmed Question Affirmed Notes Nurse Date/Time Eilene Ghazi Time) Knee Pain [1] SEVERE pain (e.g., excruciating, unable to walk) AND [2] not Sherrell Puller, RN, Amy 01/09/2019 8:34:34 AM PLEASE NOTE: All timestamps contained within this report are represented as [place] Standard Time. CONFIDENTIALTY NOTICE: This fax transmission is intended only for the addressee. It contains information that is legally privileged, confidential or otherwise protected from use or disclosure. If you are not the intended recipient, you are strictly prohibited from reviewing, disclosing, copying using or disseminating any of this information or taking any action in reliance on or regarding this information. If you have received this fax in error, please notify us immediately by telephone so that we can arrange for its return to [place]. Phone: [phone], Toll-Free: [phone], Fax: [phone] Page: 2 of 2 Call Id: [id number] Guidelines Guideline Title Affirmed Question Affirmed Notes Nurse Date/Time Eilene Ghazi Time) improved after 2 hours of pain medicine Disp. Time Eilene Ghazi Time) Disposition Final User 01/09/2019 8:38:22 AM See HCP within 4 Hours (or PCP triage) Yes Sherrell Puller, RN, Amy Caller Disagree/Comply Comply Caller Understands Yes PreDisposition Did not know what to do Care Advice Given Per Guideline SEE HCP WITHIN 4 HOURS (OR PCP TRIAGE): * IF OFFICE WILL BE OPEN: You need to be seen within the next 3 or 4 hours. Call your doctor (or NP/PA) now or as soon as the office opens. CALL BACK IF: * You become worse. CARE ADVICE given per Knee Pain (Adult) guideline. Referrals REFERRED TO PCP OFFICE

## 2019-01-10 LAB — SYNOVIAL CELL COUNT + DIFF, W/ CRYSTALS
Basophils, %: 0 %
Eosinophils-Synovial: 0 % (ref 0–2)
Lymphocytes-Synovial Fld: 55 % (ref 0–74)
Monocyte/Macrophage: 22 % (ref 0–69)
Neutrophil, Synovial: 8 % (ref 0–24)
Synoviocytes, %: 15 % (ref 0–15)
WBC, Synovial: 567 cells/uL — ABNORMAL HIGH (ref <150)

## 2019-01-10 LAB — TIQ-NTM

## 2019-01-10 LAB — GLUCOSE, SYNOVIAL FLUID: Glucose, Synovial Fluid: 107 mg/dL

## 2019-01-10 LAB — PROTEIN, SYNOVIAL FLUID: PROTEIN, TOTAL, SYNOVIAL FLUID: 2.9 g/dL (ref 1.0–3.0)

## 2019-01-26 ENCOUNTER — Other Ambulatory Visit: Payer: Self-pay

## 2019-01-26 ENCOUNTER — Encounter: Payer: Self-pay | Admitting: Family Medicine

## 2019-01-26 ENCOUNTER — Ambulatory Visit: Payer: BC Managed Care – PPO | Admitting: Family Medicine

## 2019-01-26 DIAGNOSIS — M25562 Pain in left knee: Secondary | ICD-10-CM

## 2019-01-26 DIAGNOSIS — G8929 Other chronic pain: Secondary | ICD-10-CM

## 2019-01-26 MED ORDER — TRAMADOL HCL 50 MG PO TABS: 50.0000 mg | ORAL_TABLET | Freq: Two times a day (BID) | ORAL | 0 refills | Status: AC | PRN

## 2019-01-26 NOTE — Assessment & Plan Note (Signed)
Pain improved after steroid injection for short term , now has returned.  Conitnue NSAIDs topical and oral. Can use tramadol for break through pain.  Encouraged aggressive weight loss and water/ low impact exercise.

## 2019-01-26 NOTE — Patient Instructions (Addendum)
Continue anti-inflammatories.  Continue ice and brace, home PT.  Can use tramadol for break through pain 1-2 times daily... I sent in 5 day supply .. if helping call for longer prescriptions.  Follow up with Dr. Lorelei Pont if not improving.

## 2019-01-26 NOTE — Progress Notes (Addendum)
Chief Complaint  Patient presents with  . Knee Pain    Left    History of Present Illness: HPI   53 year old male presents with continued pain in left knee.  Saw Dr. Lorelei Pont on 01/09/2019 Joint aspiration revealed:  Given 80 mg DepoMedrol/lidocain injection as well.   No gout crystals or infection seen.  Initially he had improvement  In last week and 1/2.. now in past 6 days it has recurred.  He has been wearing a brace and applying ice gel insert.  Pain 8/10 on pain scale.. medial jonit line. No redness, no heat in joint. He continues diclofenac gel TID. He is also using diclofenac by mouth.  X-ray: IMPRESSION: 1. No acute fracture or dislocation identified about the left knee. 2. Patella [place]. 3. Mild osteophytosis of the tibial spines. Moderate enthesophyte formation about the patella.  COVID 19 screen No recent travel or known exposure to COVID19 The patient denies respiratory symptoms of COVID 19 at this time.  The importance of social distancing was discussed today.   Review of Systems  Constitutional: Negative for chills and fever.  HENT: Negative for congestion and ear pain.   Eyes: Negative for pain and redness.  Respiratory: Negative for cough and shortness of breath.   Cardiovascular: Negative for chest pain, palpitations and leg swelling.  Gastrointestinal: Negative for abdominal pain, blood in stool, constipation, diarrhea, nausea and vomiting.  Genitourinary: Negative for dysuria.  Musculoskeletal: Negative for falls and myalgias.  Skin: Negative for rash.  Neurological: Negative for dizziness.  Psychiatric/Behavioral: Negative for depression. The patient is not nervous/anxious.       Past Medical History:  Diagnosis Date  . Acute sinusitis, unspecified   . Dermatophytosis of nail   . Esophageal reflux   . Essential hypertension, benign   . Impotence of organic origin   . Morbid obesity ([place])   . Obstructive sleep apnea (adult) (pediatric)   .  Other and unspecified hyperlipidemia   . Other malaise and fatigue   . Other testicular hypofunction   . Pure hypercholesterolemia   . Rosacea   . Routine general medical examination at a health care facility   . Sleep apnea     reports that he quit smoking about 25 years ago. He has a 15.00 pack-year smoking history. He has never used smokeless tobacco. He reports that he does not drink alcohol or use drugs.   Current Outpatient Medications:  .  ASPIRIN ADULT LOW STRENGTH 81 MG EC tablet, TAKE 1 TABLET BY MOUTH EVERY DAY, Disp: 90 tablet, Rfl: 3 .  diclofenac (VOLTAREN) 75 MG EC tablet, Take 1 tablet (75 mg total) by mouth 2 (two) times daily., Disp: 60 tablet, Rfl: 3 .  diclofenac sodium (VOLTAREN) 1 % GEL, Apply 4 g topically 4 (four) times daily. Pt in on aspirin and NSAIDs orally not recommended given cardiac issues., Disp: 100 g, Rfl: 2 .  losartan (COZAAR) 100 MG tablet, TAKE 1 TABLET BY MOUTH EVERY DAY, Disp: 90 tablet, Rfl: 1 .  omeprazole (PRILOSEC) 40 MG capsule, TAKE 1 CAPSULE BY MOUTH EVERY DAY, Disp: 90 capsule, Rfl: 3 .  PRESCRIPTION MEDICATION, Inhale into the lungs at bedtime. CPAP, Disp: , Rfl:  .  simvastatin (ZOCOR) 40 MG tablet, TAKE 1 TABLET BY MOUTH EVERY DAY, Disp: 90 tablet, Rfl: 3 .  triamterene-hydrochlorothiazide (MAXZIDE-25) 37.5-25 MG tablet, TAKE 1 TABLET BY MOUTH EVERY DAY, Disp: 90 tablet, Rfl: 1   Observations/Objective: Blood pressure 110/60, pulse (!) 58, temperature  98.7 F (37.1 C), temperature source Temporal, height 5\' 10"  (1.778 m), weight (!) 340 lb 8 oz (154.4 kg), SpO2 94 %.  Physical Exam Constitutional:      Appearance: He is well-developed. He is obese.  HENT:     Head: Normocephalic.     Right Ear: Hearing normal.     Left Ear: Hearing normal.     Nose: Nose normal.  Neck:     Thyroid: No thyroid mass or thyromegaly.     Vascular: No carotid bruit.     Trachea: Trachea normal.  Cardiovascular:     Rate and Rhythm: Normal rate and  regular rhythm.     Pulses: Normal pulses.     Heart sounds: Heart sounds not distant. No murmur. No friction rub. No gallop.      Comments: No peripheral edema Pulmonary:     Effort: Pulmonary effort is normal. No respiratory distress.     Breath sounds: Normal breath sounds.  Musculoskeletal:     Left knee: He exhibits normal range of motion, no swelling, no effusion, no bony tenderness, normal meniscus and no MCL laxity. Tenderness found. Medial joint line tenderness noted. No lateral joint line, no MCL, no LCL and no patellar tendon tenderness noted.  Skin:    General: Skin is warm and dry.     Findings: No rash.  Psychiatric:        Speech: Speech normal.        Behavior: Behavior normal.        Thought Content: Thought content normal.      Assessment and Plan   Left knee pain Pain improved after steroid injection for short term , now has returned.  Conitnue NSAIDs topical and oral. Can use tramadol for break through pain.  Encouraged aggressive weight loss and water/ low impact exercise.    Eliezer Lofts, MD

## 2019-02-22 ENCOUNTER — Other Ambulatory Visit: Payer: Self-pay | Admitting: Family Medicine

## 2019-02-22 NOTE — Telephone Encounter (Signed)
Last office visit 01/26/2019 for knee pain.  Last refilled 10/11/2018 for 100 g with 2 refills.  CPE scheduled for 12/12/2019.

## 2019-02-27 ENCOUNTER — Encounter: Payer: Self-pay | Admitting: Family Medicine

## 2019-02-27 ENCOUNTER — Ambulatory Visit (INDEPENDENT_AMBULATORY_CARE_PROVIDER_SITE_OTHER): Payer: BC Managed Care – PPO | Admitting: Family Medicine

## 2019-02-27 ENCOUNTER — Ambulatory Visit: Payer: BC Managed Care – PPO | Admitting: Family Medicine

## 2019-02-27 VITALS — Ht 70.0 in | Wt 335.0 lb

## 2019-02-27 DIAGNOSIS — R52 Pain, unspecified: Secondary | ICD-10-CM | POA: Diagnosis not present

## 2019-02-27 DIAGNOSIS — R509 Fever, unspecified: Secondary | ICD-10-CM

## 2019-02-27 DIAGNOSIS — R0981 Nasal congestion: Secondary | ICD-10-CM

## 2019-02-27 NOTE — Progress Notes (Signed)
Virtual Visit via Video Note  I connected with Todd Owens on 02/27/19 at 10:40 AM EDT by a video enabled telemedicine application and verified that I am speaking with the correct person using two identifiers.  Location: Patient: In his home Provider: [facility]   I discussed the limitations of evaluation and management by telemedicine and the availability of in person appointments. The patient expressed understanding and agreed to proceed.  History of Present Illness: This is a 53 yo male who requests virtual visit today to discuss URI symptoms.  Chief Complaint  Patient presents with  . Fever    Pt c/o sinus headache and congestion x 3 days. Pt reports fever 99 to 100.7 since friday. Pt using Tylenol to supress fever. Some body aches. Denies sore throat.       Past Medical History:  Diagnosis Date  . Acute sinusitis, unspecified   . Dermatophytosis of nail   . Esophageal reflux   . Essential hypertension, benign   . Impotence of organic origin   . Morbid obesity ([place])   . Obstructive sleep apnea (adult) (pediatric)   . Other and unspecified hyperlipidemia   . Other malaise and fatigue   . Other testicular hypofunction   . Pure hypercholesterolemia   . Rosacea   . Routine general medical examination at a health care facility   . Sleep apnea    Past Surgical History:  Procedure Laterality Date  . BACK SURGERY  2003  . cardiolyte  2001   neg  . KNEE SURGERY  2005  . PFT  2007   WNL  . WRIST SURGERY Right    Family History  Problem Relation Age of Onset  . Hypertension Mother   . Heart disease Mother        pacemaker  . Hypertension Father   . Stroke Father   . Heart disease Father 97  . Heart attack Father   . Heart disease Sister   . Heart attack Sister 51  . Heart failure Sister   . Colon cancer Neg Hx   . Esophageal cancer Neg Hx   . Liver cancer Neg Hx   . Pancreatic cancer Neg Hx   . Stomach cancer Neg Hx   . Rectal cancer Neg Hx     Social History   Tobacco Use  . Smoking status: Former Smoker    Packs/day: 1.00    Years: 15.00    Pack years: 15.00    Quit date: 1995    Years since quitting: 25.6  . Smokeless tobacco: Never Used  Substance Use Topics  . Alcohol use: No  . Drug use: No    Observations/Objective: Patient is alert and answers questions appropriately.  Visible skin is unremarkable.  He sounds stopped up.  He is normally conversive without shortness of breath, audible wheeze or witnessed cough.  Mood and affect are appropriate. Ht 5\' 10"  (1.778 m)   Wt (!) 335 lb (152 kg)   BMI 48.07 kg/m  Wt Readings from Last 3 Encounters:  02/27/19 (!) 335 lb (152 kg)  01/26/19 (!) 340 lb 8 oz (154.4 kg)  01/09/19 (!) 336 lb 12 oz (152.7 kg)   BP Readings from Last 3 Encounters:  01/26/19 110/60  01/09/19 104/62  12/09/18 110/70     Assessment and Plan: 1. Nasal congestion - discussed possible causes, viral illness vs allergies - discussed symptomatic relief and RTC precautions, also sent him written instructions via his mychart account - suggested he  try daily antihistamine, Afrin nasal spray, saline nasal spray, OTC cough medication, acetaminophen prn  2. Elevated temperature - see #1  3. Body aches - see #   Clarene Reamer, FNP-BC  [facility]  02/27/2019 1:47 PM   Follow Up Instructions: Visit recap sent to patient via Mychart   I discussed the assessment and treatment plan with the patient. The patient was provided an opportunity to ask questions and all were answered. The patient agreed with the plan and demonstrated an understanding of the instructions.   The patient was advised to call back or seek an in-person evaluation if the symptoms worsen or if the condition fails to improve as anticipated.   Elby Beck, FNP

## 2019-03-07 ENCOUNTER — Encounter: Payer: Self-pay | Admitting: Family Medicine

## 2019-03-07 ENCOUNTER — Ambulatory Visit (INDEPENDENT_AMBULATORY_CARE_PROVIDER_SITE_OTHER): Payer: BC Managed Care – PPO | Admitting: Family Medicine

## 2019-03-07 VITALS — Temp 97.4°F | Ht 70.0 in

## 2019-03-07 DIAGNOSIS — R059 Cough, unspecified: Secondary | ICD-10-CM

## 2019-03-07 DIAGNOSIS — R05 Cough: Secondary | ICD-10-CM

## 2019-03-07 DIAGNOSIS — Z20828 Contact with and (suspected) exposure to other viral communicable diseases: Secondary | ICD-10-CM | POA: Diagnosis not present

## 2019-03-07 MED ORDER — GUAIFENESIN-CODEINE 100-10 MG/5ML PO SYRP: 5.0000 mL | ORAL_SOLUTION | Freq: Every evening | ORAL | 0 refills | Status: DC | PRN

## 2019-03-07 NOTE — Progress Notes (Signed)
VIRTUAL VISIT Due to national recommendations of social distancing due to [place] 19, a virtual visit is felt to be most appropriate for this patient at this time.   I connected with the patient on 03/07/19 at  2:20 PM EDT by virtual telehealth platform and verified that I am speaking with the correct person using two identifiers.   I discussed the limitations, risks, security and privacy concerns of performing an evaluation and management service by  virtual telehealth platform and the availability of in person appointments. I also discussed with the patient that there may be a patient responsible charge related to this service. The patient expressed understanding and agreed to proceed.  Patient location: Home Provider Location: [facility] Participants: Eliezer Lofts and Vernetta Honey   Chief Complaint  Patient presents with  . Emesis  . Diarrhea  . Cough    Dry    History of Present Illness: 53 year old obese male presents with new onset vomiting, diarrhea and dry cough.  He reports he had a virtual visit on 02/27/2019 with Tor Netters (reviewed) for sinus headache,congestion, fever 100.582F, body aches. He started feeling better until 5 days ago.   Temperature 100.82F, congestion and dry cough, coughing spells.   BM loose and more frequent BMs.  When coughing ... having post tussive emesis.  No further body aches.  No ST. NO SOB, no wheeze.  Wife sick as well with dry cough.   Working for [employer] and [employer].    COVID 19 screen No recent travel or known exposure to COVID19 The patient denies respiratory symptoms of COVID 19 at this time.  The importance of social distancing was discussed today.   Review of Systems  Constitutional: Positive for fever. Negative for chills and malaise/fatigue.  HENT: Positive for congestion. Negative for sinus pain and sore throat.   Respiratory: Positive for cough. Negative for sputum production, shortness of  breath and wheezing.   Cardiovascular: Negative for palpitations.      Past Medical History:  Diagnosis Date  . Acute sinusitis, unspecified   . Dermatophytosis of nail   . Esophageal reflux   . Essential hypertension, benign   . Impotence of organic origin   . Morbid obesity ([place])   . Obstructive sleep apnea (adult) (pediatric)   . Other and unspecified hyperlipidemia   . Other malaise and fatigue   . Other testicular hypofunction   . Pure hypercholesterolemia   . Rosacea   . Routine general medical examination at a health care facility   . Sleep apnea     reports that he quit smoking about 25 years ago. He has a 15.00 pack-year smoking history. He has never used smokeless tobacco. He reports that he does not drink alcohol or use drugs.   Current Outpatient Medications:  .  ASPIRIN ADULT LOW STRENGTH 81 MG EC tablet, TAKE 1 TABLET BY MOUTH EVERY DAY, Disp: 90 tablet, Rfl: 3 .  diclofenac (VOLTAREN) 75 MG EC tablet, Take 1 tablet (75 mg total) by mouth 2 (two) times daily., Disp: 60 tablet, Rfl: 3 .  diclofenac sodium (VOLTAREN) 1 % GEL, APPLY 4 GRAMS TOPICALLY 4 TIMES DAILY**ASPIRIN & NSAIDS ORALLY NOT RECOMMENDED GIVEN CARDIAC ISSUES, Disp: 100 g, Rfl: 2 .  losartan (COZAAR) 100 MG tablet, TAKE 1 TABLET BY MOUTH EVERY DAY, Disp: 90 tablet, Rfl: 1 .  omeprazole (PRILOSEC) 40 MG capsule, TAKE 1 CAPSULE BY MOUTH EVERY DAY, Disp: 90 capsule, Rfl: 3 .  PRESCRIPTION  MEDICATION, Inhale into the lungs at bedtime. CPAP, Disp: , Rfl:  .  simvastatin (ZOCOR) 40 MG tablet, TAKE 1 TABLET BY MOUTH EVERY DAY, Disp: 90 tablet, Rfl: 3 .  triamterene-hydrochlorothiazide (MAXZIDE-25) 37.5-25 MG tablet, TAKE 1 TABLET BY MOUTH EVERY DAY, Disp: 90 tablet, Rfl: 1   Observations/Objective: Temperature (!) 97.4 F (36.3 C), temperature source Oral, height 5\' 10"  (1.778 m).  Physical Exam  Physical Exam Constitutional:      General: The patient is not in acute distress. Pulmonary:     Effort:  Pulmonary effort is normal. No respiratory distress.  Neurological:     Mental Status: The patient is alert and oriented to person, place, and time.  Psychiatric:        Mood and Affect: Mood normal.        Behavior: Behavior normal.   Assessment and Plan Cough Most likely viral illness... send for covid testing.  Symptomatic care and home isolation. If negative covid test: return to work when respiratory symptoms free and  No fever. Place on respiratory call back list.  If SOb go to ER. No clear sign of bacterial illness. If positive covid test:  Can return to work 10 days since symptom onset or positive COVID-19 test, 3 consecutive days without fever and without antipyretics, and symptom improvement [especially respiratory]).      I discussed the assessment and treatment plan with the patient. The patient was provided an opportunity to ask questions and all were answered. The patient agreed with the plan and demonstrated an understanding of the instructions.   The patient was advised to call back or seek an in-person evaluation if the symptoms worsen or if the condition fails to improve as anticipated.     Eliezer Lofts, MD

## 2019-03-07 NOTE — Progress Notes (Signed)
Mr. Grenz added to Respiratory Illness Call Log.  Work note written as instructed by Dr. Diona Browner.  Mr. Caterino notified by telephone that note is ready to be picked up at the front desk.

## 2019-03-07 NOTE — Assessment & Plan Note (Signed)
Most likely viral illness... send for covid testing.  Symptomatic care and home isolation. If negative covid test: return to work when respiratory symptoms free and  No fever. Place on respiratory call back list.  If SOb go to ER. No clear sign of bacterial illness. If positive covid test:  Can return to work 10 days since symptom onset or positive COVID-19 test, 3 consecutive days without fever and without antipyretics, and symptom improvement [especially respiratory]).

## 2019-03-07 NOTE — Patient Instructions (Addendum)
Get coronavirus testing or Can do home test [email or web address]  Rest, fluids.  Can use cough suppressant at night.  How to care for yourself at home:   1) Drink plenty of fluids 2) Get lots of rest 3) Wash your hands regularly - for 20 seconds 4) Cover your mouth when you cough -- ideally cough into your elbow 5) Take tylenol - can do up to 1000 mg every 8 hours (or do smaller doses more frequently) - Avoid Ibuprofen if able 6) Stay home - avoid contact with other people. Ideally have someone bring your groceries, etc.  7) Avoid touching your eyes, nose, mouth 8) Over the counter cold medication may be helpful  You leave home again - when ALL the following are TRUE:  1) No fever for at least 72 hours (without taking any medication) 2) Other symptoms have improved - no longer with cough or shortness of breath 3) At 10 least days since you got sick  Call the clinic immediately or consider going to the emergency room if:  1) Trouble Breathing 2) Persistent pain or pressure in the chest 3) New confusion or inability to wake 4) Bluish lips or face 5) Notify them that you may have COVID-19  For close contacts (people in your home)  1) Help the person you are with stay home - grocery, pharmacy, etc 2) Help monitor their symptoms for worsening illness 3) Ideally sleep in a separate room and try to use separate bathroom if possible 4) Try to limit care giver to ONE person - all others (including pets) should avoid contact 5) Clean surfaces often and wash laundry often 6) Monitor yourself for symptoms. Make sure you contact your health care provider and avoid work as soon as you develop symptoms 7) Ideally would recommend working from home or not going to work if able.    Medications:  You may have heard about medications currently being used to treat COVID-19 - including Remdesivir and Hydroxychloroquine/Chloroquine. Currently there are no FDA approved medications to treat COVID-19 and  these medicines are only being used as part of a clinical trials (still studying the effect). They are only being used in hospitalized patients because they come with significant risks.   The only proven treatment is symptomatic care - rest, fluids, tylenol.    Below is more detailed information from the [facility]  Individuals who are confirmed to have, or are being evaluated for, COVID-19 should follow the prevention steps below until a healthcare provider or local or state health department says they can return to normal activities.  Stay home except to get medical care You should restrict activities outside your home, except for getting medical care. Do not go to work, school, or public areas, and do not use public transportation or taxis.  Call ahead before visiting your doctor Before your medical appointment, call the healthcare provider and tell them that you are being evaluated for, COVID-19 infection.  Monitor your symptoms Seek prompt medical attention if your illness is worsening (e.g., difficulty breathing).   Wear a facemask You should wear a facemask that covers your nose and mouth when you are in the same room with other people and when you visit a healthcare provider.   Separate yourself from other people in your home As much as possible, you should stay in a different room from other people in your home. Also, you should use a separate bathroom, if available.  Avoid sharing household items You should not share  dishes, drinking glasses, cups, eating utensils, towels, bedding, or other items with other people in your home. After using these items, you should wash them thoroughly with soap and water.  Cover your coughs and sneezes Cover your mouth and nose with a tissue when you cough or sneeze, or you can cough or sneeze into your sleeve. Throw used tissues in a lined trash can, and immediately wash your hands with soap and water for at least 20 seconds or  use an alcohol-based hand rub.  Wash your [facility] your hands often and thoroughly with soap and water for at least 20 seconds. You can use an alcohol-based hand sanitizer if soap and water are not available and if your hands are not visibly dirty. Avoid touching your eyes, nose, and mouth with unwashed hands.   Prevention Steps for Caregivers and Household Members of Individuals Confirmed to have, or Being Evaluated for, COVID-19 Infection Being Cared for in the Home  If you live with, or provide care at home for, a person confirmed to have, or being evaluated for, COVID-19 infection please follow these guidelines to prevent infection:  Follow healthcare provider's instructions Make sure that you understand and can help the patient follow any healthcare provider instructions for all care.  Provide for the patient's basic needs You should help the patient with basic needs in the home and provide support for getting groceries, prescriptions, and other personal needs.  Monitor the patient's symptoms If they are getting sicker, call his or her medical provider.   Limit the number of people who have contact with the patient  If possible, have only one caregiver for the patient.  Other household members should stay in another home or place of residence. If this is not possible, they should stay  in another room, or be separated from the patient as much as possible. Use a separate bathroom, if available.  Restrict visitors who do not have an essential need to be in the home.  Keep older adults, very young children, and other sick people away from the patient Keep older adults, very young children, and those who have compromised immune systems or chronic health conditions away from the patient. This includes people with chronic heart, lung, or kidney conditions, diabetes, and cancer.  Ensure good ventilation Make sure that shared spaces in the home have good air flow, such as from  an air conditioner or an opened window, weather permitting.  Wash your hands often  Wash your hands often and thoroughly with soap and water for at least 20 seconds. You can use an alcohol based hand sanitizer if soap and water are not available and if your hands are not visibly dirty.  Avoid touching your eyes, nose, and mouth with unwashed hands.  Use disposable paper towels to dry your hands. If not available, use dedicated cloth towels and replace them when they become wet.  Wear a facemask and gloves  Wear a disposable facemask at all times in the room and gloves when you touch or have contact with the patient's blood, body fluids, and/or secretions or excretions, such as sweat, saliva, sputum, nasal mucus, vomit, urine, or feces.  Ensure the mask fits over your nose and mouth tightly, and do not touch it during use.  Throw out disposable facemasks and gloves after using them. Do not reuse.  Wash your hands immediately after removing your facemask and gloves.  If your personal clothing becomes contaminated, carefully remove clothing and launder. Wash your hands after  handling contaminated clothing.  Place all used disposable facemasks, gloves, and other waste in a lined container before disposing them with other household waste.  Remove gloves and wash your hands immediately after handling these items.  Do not share dishes, glasses, or other household items with the patient  Avoid sharing household items. You should not share dishes, drinking glasses, cups, eating utensils, towels, bedding, or other items with a patient who is confirmed to have, or being evaluated for, COVID-19 infection.  After the person uses these items, you should wash them thoroughly with soap and water.  Wash laundry thoroughly  Immediately remove and wash clothes or bedding that have blood, body fluids, and/or secretions or excretions, such as sweat, saliva, sputum, nasal mucus, vomit, urine, or feces, on  them.  Wear gloves when handling laundry from the patient.  Read and follow directions on labels of laundry or clothing items and detergent. In general, wash and dry with the warmest temperatures recommended on the label.  Clean all areas the individual has used often  Clean all touchable surfaces, such as counters, tabletops, doorknobs, bathroom fixtures, toilets, phones, keyboards, tablets, and bedside tables, every day. Also, clean any surfaces that may have blood, body fluids, and/or secretions or excretions on them.  Wear gloves when cleaning surfaces the patient has come in contact with.  Use a diluted bleach solution (e.g., dilute bleach with 1 part bleach and 10 parts water) or a household disinfectant with a label that says EPA-registered for coronaviruses. To make a bleach solution at home, add 1 tablespoon of bleach to 1 quart (4 cups) of water. For a larger supply, add  cup of bleach to 1 gallon (16 cups) of water.  Read labels of cleaning products and follow recommendations provided on product labels. Labels contain instructions for safe and effective use of the cleaning product including precautions you should take when applying the product, such as wearing gloves or eye protection and making sure you have good ventilation during use of the product.  Remove gloves and wash hands immediately after cleaning.  Monitor yourself for signs and symptoms of illness Caregivers and household members are considered close contacts, should monitor their health, and will be asked to limit movement outside of the home to the extent possible. Follow the monitoring steps for close contacts listed on the symptom monitoring form.      Person Under Monitoring Name: Todd Owens  Location: [place]  [place] [address]   Infection Prevention Recommendations for Individuals Confirmed to have, or Being Evaluated for, 2019 Novel Coronavirus (COVID-19) Infection Who Receive Care  at Home  Individuals who are confirmed to have, or are being evaluated for, COVID-19 should follow the prevention steps below until a healthcare provider or local or state health department says they can return to normal activities.  Stay home except to get medical care You should restrict activities outside your home, except for getting medical care. Do not go to work, school, or public areas, and do not use public transportation or taxis.  Call ahead before visiting your doctor Before your medical appointment, call the healthcare provider and tell them that you have, or are being evaluated for, COVID-19 infection. This will help the healthcare provider's office take steps to keep other people from getting infected. Ask your healthcare provider to call the local or state health department.  Monitor your symptoms Seek prompt medical attention if your illness is worsening (e.g., difficulty breathing). Before going to your  medical appointment, call the healthcare provider and tell them that you have, or are being evaluated for, COVID-19 infection. Ask your healthcare provider to call the local or state health department.  Wear a facemask You should wear a facemask that covers your nose and mouth when you are in the same room with other people and when you visit a healthcare provider. People who live with or visit you should also wear a facemask while they are in the same room with you.  Separate yourself from other people in your home As much as possible, you should stay in a different room from other people in your home. Also, you should use a separate bathroom, if available.  Avoid sharing household items You should not share dishes, drinking glasses, cups, eating utensils, towels, bedding, or other items with other people in your home. After using these items, you should wash them thoroughly with soap and water.  Cover your coughs and sneezes Cover your mouth and nose with a tissue  when you cough or sneeze, or you can cough or sneeze into your sleeve. Throw used tissues in a lined trash can, and immediately wash your hands with soap and water for at least 20 seconds or use an alcohol-based hand rub.  Wash your [facility] your hands often and thoroughly with soap and water for at least 20 seconds. You can use an alcohol-based hand sanitizer if soap and water are not available and if your hands are not visibly dirty. Avoid touching your eyes, nose, and mouth with unwashed hands.   Prevention Steps for Caregivers and Household Members of Individuals Confirmed to have, or Being Evaluated for, COVID-19 Infection Being Cared for in the Home  If you live with, or provide care at home for, a person confirmed to have, or being evaluated for, COVID-19 infection please follow these guidelines to prevent infection:  Follow healthcare provider's instructions Make sure that you understand and can help the patient follow any healthcare provider instructions for all care.  Provide for the patient's basic needs You should help the patient with basic needs in the home and provide support for getting groceries, prescriptions, and other personal needs.  Monitor the patient's symptoms If they are getting sicker, call his or her medical provider and tell them that the patient has, or is being evaluated for, COVID-19 infection. This will help the healthcare provider's office take steps to keep other people from getting infected. Ask the healthcare provider to call the local or state health department.  Limit the number of people who have contact with the patient  If possible, have only one caregiver for the patient.  Other household members should stay in another home or place of residence. If this is not possible, they should stay  in another room, or be separated from the patient as much as possible. Use a separate bathroom, if available.  Restrict visitors who do not have an  essential need to be in the home.  Keep older adults, very young children, and other sick people away from the patient Keep older adults, very young children, and those who have compromised immune systems or chronic health conditions away from the patient. This includes people with chronic heart, lung, or kidney conditions, diabetes, and cancer.  Ensure good ventilation Make sure that shared spaces in the home have good air flow, such as from an air conditioner or an opened window, weather permitting.  Wash your hands often  Wash your hands often and thoroughly with  soap and water for at least 20 seconds. You can use an alcohol based hand sanitizer if soap and water are not available and if your hands are not visibly dirty.  Avoid touching your eyes, nose, and mouth with unwashed hands.  Use disposable paper towels to dry your hands. If not available, use dedicated cloth towels and replace them when they become wet.  Wear a facemask and gloves  Wear a disposable facemask at all times in the room and gloves when you touch or have contact with the patient's blood, body fluids, and/or secretions or excretions, such as sweat, saliva, sputum, nasal mucus, vomit, urine, or feces.  Ensure the mask fits over your nose and mouth tightly, and do not touch it during use.  Throw out disposable facemasks and gloves after using them. Do not reuse.  Wash your hands immediately after removing your facemask and gloves.  If your personal clothing becomes contaminated, carefully remove clothing and launder. Wash your hands after handling contaminated clothing.  Place all used disposable facemasks, gloves, and other waste in a lined container before disposing them with other household waste.  Remove gloves and wash your hands immediately after handling these items.  Do not share dishes, glasses, or other household items with the patient  Avoid sharing household items. You should not share dishes,  drinking glasses, cups, eating utensils, towels, bedding, or other items with a patient who is confirmed to have, or being evaluated for, COVID-19 infection.  After the person uses these items, you should wash them thoroughly with soap and water.  Wash laundry thoroughly  Immediately remove and wash clothes or bedding that have blood, body fluids, and/or secretions or excretions, such as sweat, saliva, sputum, nasal mucus, vomit, urine, or feces, on them.  Wear gloves when handling laundry from the patient.  Read and follow directions on labels of laundry or clothing items and detergent. In general, wash and dry with the warmest temperatures recommended on the label.  Clean all areas the individual has used often  Clean all touchable surfaces, such as counters, tabletops, doorknobs, bathroom fixtures, toilets, phones, keyboards, tablets, and bedside tables, every day. Also, clean any surfaces that may have blood, body fluids, and/or secretions or excretions on them.  Wear gloves when cleaning surfaces the patient has come in contact with.  Use a diluted bleach solution (e.g., dilute bleach with 1 part bleach and 10 parts water) or a household disinfectant with a label that says EPA-registered for coronaviruses. To make a bleach solution at home, add 1 tablespoon of bleach to 1 quart (4 cups) of water. For a larger supply, add  cup of bleach to 1 gallon (16 cups) of water.  Read labels of cleaning products and follow recommendations provided on product labels. Labels contain instructions for safe and effective use of the cleaning product including precautions you should take when applying the product, such as wearing gloves or eye protection and making sure you have good ventilation during use of the product.  Remove gloves and wash hands immediately after cleaning.  Monitor yourself for signs and symptoms of illness Caregivers and household members are considered close contacts, should  monitor their health, and will be asked to limit movement outside of the home to the extent possible. Follow the monitoring steps for close contacts listed on the symptom monitoring form.   ? If you have additional questions, contact your local health department or call the epidemiologist on call at [phone] (available 24/7). ? This guidance  is subject to change. For the most up-to-date guidance from [facility], please refer to their website: [email or web address]

## 2019-03-08 ENCOUNTER — Other Ambulatory Visit: Payer: Self-pay

## 2019-03-08 DIAGNOSIS — Z20822 Contact with and (suspected) exposure to covid-19: Secondary | ICD-10-CM

## 2019-03-10 LAB — NOVEL CORONAVIRUS, NAA: SARS-CoV-2, NAA: DETECTED — AB

## 2019-03-11 ENCOUNTER — Telehealth: Payer: Self-pay | Admitting: *Deleted

## 2019-03-11 NOTE — Telephone Encounter (Signed)
.   Your test for COVID-19 was positive, meaning that you were infected with the novel coronavirus and could give the germ to others.   . patient instructed to remain in self-quarantine until they meet the Criteria for Ending Self-Isolation. - at least 10 days since symptoms onset - AND 3 consecutive days fever free without antipyretics (acetaminophen [Tylenol] or ibuprofen [Advil]) - AND improvement in respiratory symptoms . If the patient develops respiratory issues/distress, seek medical care in the Emergency Department, call 911, reports symptoms and report COVID-19 positive test. Patient Instructions .  continue to utilize over-the-counter medications for fever (ibuprofen and/or Tylenol) and cough (cough medicine and/or sore throat lozenges). .  wear a mask around people and follow good infection prevention techniques. . Patient to should only leave home to seek medical care. . Instruct patient to send family for food, prescriptions or medicines; or use delivery service.  . If the patient must leave the home, they must wear a mask in public. Marland Kitchen limit contact with immediate family members or caregivers in the home, and use mask, social distancing, and handwashing to decrease risk to patients. o Please continue good preventive care measures, including frequent hand washing, avoid touching your face, cover coughs/sneezes with tissue or into elbow, stay out of crowds and keep a 6-foot distance from others.   . patient and family to clean hard surfaces touched by patient frequently with household cleaning products.; also informed pt that his PCP, and the health dept will be notified; he verbalized understanding.

## 2019-03-13 ENCOUNTER — Telehealth: Payer: Self-pay | Admitting: *Deleted

## 2019-03-13 NOTE — Telephone Encounter (Signed)
Patient called stating that he was tested last week for Covid and the results were on mychart Friday night which was positive. Patient stated that a nurse from [facility] called him Saturday morning advising his of the results. Patient stated that he was advised by the [facility] nurse that he should stay in quarantine until he is symptom free for 3 days and no cough without medication for 3 days. Patient stated that the only symptom that he has now is a cough. Patient stated that the nurse told him that he will be getting a call from the Health Department, but he has not gotten one yet. Patient wants guidance from Dr. Diona Browner as to what precautions he should take.

## 2019-03-14 ENCOUNTER — Encounter: Payer: Self-pay | Admitting: *Deleted

## 2019-03-14 NOTE — Telephone Encounter (Signed)
[name] notified as instructed by telephone.  Covid Homecare guidelines sent to patient via MyChart.  He is also on my Respiratory Illness Call Log.  He tested positive for Covid 19 six days ago. He states the only symptom he currently has is a mild occasional cough.  He denies any fever or SOB.  He has also reached out to his work to see what their guidelines are for him returning to work.  He will call us back for a return to work note when needed.

## 2019-03-14 NOTE — Telephone Encounter (Signed)
If your COVID test is POSITIVE you may return to work/school  if all of the following are true: 1.  10 days since symptom onset or positive COVID-19 test 2.    3 consecutive days without fever and without antipyretics 3.    You have symptom improvement [especially respiratory]) x 3 days.  . Can send home isolation guidelines through mychart if needed. QUICK text [email or web address]

## 2019-03-15 ENCOUNTER — Other Ambulatory Visit: Payer: Self-pay | Admitting: Family Medicine

## 2019-03-17 ENCOUNTER — Telehealth: Payer: Self-pay | Admitting: *Deleted

## 2019-03-17 NOTE — Telephone Encounter (Signed)
Called patient to follow-up with him because he had a Covid-19 positive result. Patient stated that he is doing great. Patient stated that he is out doing yard work to get his strength back. Patient stated that he is planning to go back to work Monday. Patient stated the Health Department is providing him a letter so that he can return to work on Monday. Advise patient to let Dr. Diona Browner know if anything changes and he verbalized understanding.

## 2019-03-21 NOTE — Telephone Encounter (Signed)
Noted  

## 2019-03-21 NOTE — Telephone Encounter (Signed)
Please note, sign encounter when completed. 

## 2019-03-21 NOTE — Telephone Encounter (Signed)
Noted. Can remove from respiratory call back list.

## 2019-06-01 ENCOUNTER — Telehealth: Payer: Self-pay | Admitting: Adult Health

## 2019-06-01 NOTE — Telephone Encounter (Signed)
Called patient back. 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 Patient last seen 05/20/18. Advised patient of new location, appointment made for 06/02/19 at 9:30. Nothing further needed at this time.

## 2019-06-02 ENCOUNTER — Other Ambulatory Visit: Payer: Self-pay

## 2019-06-02 ENCOUNTER — Ambulatory Visit (INDEPENDENT_AMBULATORY_CARE_PROVIDER_SITE_OTHER): Payer: BC Managed Care – PPO | Admitting: Adult Health

## 2019-06-02 ENCOUNTER — Encounter: Payer: Self-pay | Admitting: Adult Health

## 2019-06-02 VITALS — BP 132/78 | HR 89 | Temp 98.0°F | Ht 69.0 in | Wt 332.8 lb

## 2019-06-02 DIAGNOSIS — G4733 Obstructive sleep apnea (adult) (pediatric): Secondary | ICD-10-CM

## 2019-06-02 NOTE — Assessment & Plan Note (Signed)
Severe sleep apnea with excellent control and compliance on nocturnal CPAP  Plan  Patient Instructions  Continue on CPAP At bedtime   Keep up good work .  Work on healthy weight .  Do not drive if sleepy .  Follow up with Dr. Elsworth Soho  Or Zaylon Bossier NP In 1 year and As needed

## 2019-06-02 NOTE — Progress Notes (Signed)
@Patient  ID: Todd Owens, male    DOB: 07-10-1966, 53 y.o.   MRN: [id number]  Chief Complaint  Patient presents with  . Follow-up    OSA     Referring provider: Jinny Sanders, MD  HPI: 53 year old male seen for initial sleep consult November 2017 found to have moderate to severe sleep apnea  TEST/EVENTS :  Sleep study November 2017 with AHI 29/hour  06/02/2019 follow-up obstructive sleep apnea Patient presents for a 1 year follow-up for obstructive sleep apnea.  He is on nocturnal CPAP.  Says he is doing excellent.  Has recently gotten a new CPAP machine.  Says it is working excellent.  Says he is doing very well feels rested with no significant daytime sleepiness.  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  Patient says he is very active on his job and also has a mowing business. CPAP download over the last 30 days shows excellent compliance at 100% usage with daily average usage at 8 hours.  Patient is on auto CPAP 11 to 15 cm H2O.  AHI of 1.1.  Minimal leaks.  Patient says he uses nasal pillows.  He says occasionally the nasal pillows are a little irritating to his nose otherwise they are fine.  We did give him a sample of DreamWear nasal pillows for trial.-   Covid 19 infection in August, had URI symptoms recovered totally .   No Known Allergies  Immunization History  Administered Date(s) Administered  . Influenza,inj,Quad PF,6+ Mos 05/26/2013, 03/27/2016, 03/29/2018  . Td 01/17/2010  . Tdap 04/18/2013    Past Medical History:  Diagnosis Date  . Acute sinusitis, unspecified   . Dermatophytosis of nail   . Esophageal reflux   . Essential hypertension, benign   . Impotence of organic origin   . Morbid obesity ([place])   . Obstructive sleep apnea (adult) (pediatric)   . Other and unspecified hyperlipidemia   . Other malaise and fatigue   . Other testicular hypofunction   . Pure hypercholesterolemia   . Rosacea   .  Routine general medical examination at a health care facility   . Sleep apnea     Tobacco History: Social History   Tobacco Use  Smoking Status Former Smoker  . Packs/day: 1.00  . Years: 15.00  . Pack years: 15.00  . Quit date: 10  . Years since quitting: 25.8  Smokeless Tobacco Never Used   Counseling given: Not Answered   Outpatient Medications Prior to Visit  Medication Sig Dispense Refill  . ASPIRIN ADULT LOW STRENGTH 81 MG EC tablet TAKE 1 TABLET BY MOUTH EVERY DAY 90 tablet 3  . diclofenac (VOLTAREN) 75 MG EC tablet Take 1 tablet (75 mg total) by mouth 2 (two) times daily. 60 tablet 3  . diclofenac sodium (VOLTAREN) 1 % GEL APPLY 4 GRAMS TOPICALLY 4 TIMES DAILY**ASPIRIN & NSAIDS ORALLY NOT RECOMMENDED GIVEN CARDIAC ISSUES 100 g 2  . guaiFENesin-codeine (ROBITUSSIN AC) 100-10 MG/5ML syrup Take 5-10 mLs by mouth at bedtime as needed for cough. 180 mL 0  . losartan (COZAAR) 100 MG tablet TAKE 1 TABLET BY MOUTH EVERY DAY 90 tablet 1  . omeprazole (PRILOSEC) 40 MG capsule TAKE 1 CAPSULE BY MOUTH EVERY DAY 90 capsule 3  . PRESCRIPTION MEDICATION Inhale into the lungs at bedtime. CPAP    . simvastatin (ZOCOR) 40 MG tablet TAKE 1 TABLET BY MOUTH EVERY DAY 90 tablet 3  . triamterene-hydrochlorothiazide (MAXZIDE-25)  37.5-25 MG tablet TAKE 1 TABLET BY MOUTH EVERY DAY 90 tablet 1   No facility-administered medications prior to visit.      Review of Systems:   Constitutional:   No  weight loss, night sweats,  Fevers, chills, fatigue, or  lassitude.  HEENT:   No headaches,  Difficulty swallowing,  Tooth/dental problems, or  Sore throat,                No sneezing, itching, ear ache, nasal congestion, post nasal drip,   CV:  No chest pain,  Orthopnea, PND, swelling in lower extremities, anasarca, dizziness, palpitations, syncope.   GI  No heartburn, indigestion, abdominal pain, nausea, vomiting, diarrhea, change in bowel habits, loss of appetite, bloody stools.   Resp: No  shortness of breath with exertion or at rest.  No excess mucus, no productive cough,  No non-productive cough,  No coughing up of blood.  No change in color of mucus.  No wheezing.  No chest wall deformity  Skin: no rash or lesions.  GU: no dysuria, change in color of urine, no urgency or frequency.  No flank pain, no hematuria   MS:  No joint pain or swelling.  No decreased range of motion.  No back pain.    Physical Exam  BP 132/78 (BP Location: Left Arm, Cuff Size: Large)   Pulse 89   Temp 98 F (36.7 C) (Temporal)   Ht 5\' 9"  (1.753 m)   Wt (!) 332 lb 12.8 oz (151 kg)   SpO2 98%   BMI 49.15 kg/m   GEN: A/Ox3; pleasant , NAD, obese per BMI   HEENT:  [place]/AT, , NOSE-clear, THROAT-clear, no lesions, no postnasal drip or exudate noted.  Class III MP airway  NECK:  Supple w/ fair ROM; no JVD; normal carotid impulses w/o bruits; no thyromegaly or nodules palpated; no lymphadenopathy.    RESP  Clear  P & A; w/o, wheezes/ rales/ or rhonchi. no accessory muscle use, no dullness to percussion  CARD:  RRR, no m/r/g, no peripheral edema, pulses intact, no cyanosis or clubbing.  GI:   Soft & nt; nml bowel sounds; no organomegaly or masses detected.   Musco: Warm bil, no deformities or joint swelling noted.   Neuro: alert, no focal deficits noted.    Skin: Warm, no lesions or rashes    Lab Results:  CBC  BMET  BNP No results found for: BNP  ProBNP No results found for: PROBNP  Imaging: No results found.    No flowsheet data found.  No results found for: NITRICOXIDE      Assessment & Plan:   Obstructive sleep apnea Severe sleep apnea with excellent control and compliance on nocturnal CPAP  Plan  Patient Instructions  Continue on CPAP At bedtime   Keep up good work .  Work on healthy weight .  Do not drive if sleepy .  Follow up with Dr. Elsworth Soho  Or  NP In 1 year and As needed       MORBID OBESITY Healthy weight loss.      Rexene Edison,  NP 06/02/2019

## 2019-06-02 NOTE — Patient Instructions (Signed)
Continue on CPAP At bedtime   Keep up good work .  Work on healthy weight .  Do not drive if sleepy .  Follow up with Dr. Elsworth Soho  Or Quintez Maselli NP In 1 year and As needed

## 2019-06-02 NOTE — Assessment & Plan Note (Signed)
Healthy weight loss 

## 2019-07-07 ENCOUNTER — Other Ambulatory Visit: Payer: Self-pay | Admitting: Family Medicine

## 2019-07-24 ENCOUNTER — Other Ambulatory Visit: Payer: Self-pay | Admitting: Family Medicine

## 2019-07-24 NOTE — Telephone Encounter (Signed)
Last office visit 03/07/2019 for cough.  Last refilled 02/22/2019 for 100 g with 2 refills.  CPE scheduled for 12/12/2019.

## 2019-08-18 ENCOUNTER — Other Ambulatory Visit: Payer: Self-pay

## 2019-08-18 ENCOUNTER — Ambulatory Visit: Payer: BC Managed Care – PPO | Admitting: Family Medicine

## 2019-08-18 ENCOUNTER — Encounter: Payer: Self-pay | Admitting: Family Medicine

## 2019-08-18 VITALS — BP 110/70 | HR 69 | Temp 97.7°F | Ht 69.0 in | Wt 339.2 lb

## 2019-08-18 DIAGNOSIS — H66003 Acute suppurative otitis media without spontaneous rupture of ear drum, bilateral: Secondary | ICD-10-CM

## 2019-08-18 DIAGNOSIS — R002 Palpitations: Secondary | ICD-10-CM

## 2019-08-18 DIAGNOSIS — I10 Essential (primary) hypertension: Secondary | ICD-10-CM

## 2019-08-18 LAB — CBC WITH DIFFERENTIAL/PLATELET
Basophils Absolute: 0 10*3/uL (ref 0.0–0.1)
Basophils Relative: 0.9 % (ref 0.0–3.0)
Eosinophils Absolute: 0.2 10*3/uL (ref 0.0–0.7)
Eosinophils Relative: 3.6 % (ref 0.0–5.0)
HCT: 41.2 % (ref 39.0–52.0)
Hemoglobin: 14.2 g/dL (ref 13.0–17.0)
Lymphocytes Relative: 35.5 % (ref 12.0–46.0)
Lymphs Abs: 1.9 10*3/uL (ref 0.7–4.0)
MCHC: 34.4 g/dL (ref 30.0–36.0)
MCV: 97.5 fl (ref 78.0–100.0)
Monocytes Absolute: 0.2 10*3/uL (ref 0.1–1.0)
Monocytes Relative: 4.6 % (ref 3.0–12.0)
Neutro Abs: 2.9 10*3/uL (ref 1.4–7.7)
Neutrophils Relative %: 55.4 % (ref 43.0–77.0)
Platelets: 249 10*3/uL (ref 150.0–400.0)
RBC: 4.23 Mil/uL (ref 4.22–5.81)
RDW: 13.6 % (ref 11.5–15.5)
WBC: 5.3 10*3/uL (ref 4.0–10.5)

## 2019-08-18 LAB — COMPREHENSIVE METABOLIC PANEL
ALT: 36 U/L (ref 0–53)
AST: 32 U/L (ref 0–37)
Albumin: 4 g/dL (ref 3.5–5.2)
Alkaline Phosphatase: 60 U/L (ref 39–117)
BUN: 14 mg/dL (ref 6–23)
CO2: 30 mEq/L (ref 19–32)
Calcium: 9.2 mg/dL (ref 8.4–10.5)
Chloride: 101 mEq/L (ref 96–112)
Creatinine, Ser: 0.97 mg/dL (ref 0.40–1.50)
GFR: 80.82 mL/min (ref >60.00)
Glucose, Bld: 112 mg/dL — ABNORMAL HIGH (ref 70–99)
Potassium: 4 mEq/L (ref 3.5–5.1)
Sodium: 138 mEq/L (ref 135–145)
Total Bilirubin: 0.5 mg/dL (ref 0.2–1.2)
Total Protein: 6.9 g/dL (ref 6.0–8.3)

## 2019-08-18 LAB — TSH: TSH: 3.36 u[IU]/mL (ref 0.35–4.50)

## 2019-08-18 NOTE — Progress Notes (Signed)
Chief Complaint  Patient presents with  . Headache  . Chest Pain    History of Present Illness: HPI  54 year old obese male former smoker with history of  HTN, high cholesterol, metabolic syndrome, prediabetes presents with new onset palpitations.  He reports  In last week he has noted  fluttering in chest. ONLY NOTES AT NIGHT WHEN LYING DOWN to go to bed at night.. Only lasts 5 minutes. Occurring three days in last week.  Not tender in chest. No associated  SOB, no sweating, no dizziness.  He has noted in last month.. new onset headache and occ feeling off balance. No fever. No nasal congestion except  New when takes off CPAP in AM.  One nostril closed.. Bilateral ear pressure and occ pain... right worse than left x 1 months The headaches have been ongoing off and on in last month.  Over frontal sinuses, bilateral radiating to back on neck.   Using tylenol or advil for headache pain.  No new medicine,  Drinking 1 caffeinated drink a day. Drinking 1-2 bottles water a day.  Saw Dr. Saunders Revel Cardiologist in 2018 following syncopal episode... exercise treadmill test showed no abnormalities Cardiac risk factor:  male, high cholesterol but on statin, former smoker, obesity, HTN but at goal, family history of early CAD heart disease in mother , father, sister, brother.  Lab Results  Component Value Date   CHOL 125 12/07/2018   HDL 34.20 (L) 12/07/2018   LDLCALC 77 12/07/2018   TRIG 69.0 12/07/2018   CHOLHDL 4 12/07/2018     This visit occurred during the SARS-CoV-2 public health emergency.  Safety protocols were in place, including screening questions prior to the visit, additional usage of staff PPE, and extensive cleaning of exam room while observing appropriate contact time as indicated for disinfecting solutions.   COVID 19 screen:  No recent travel or known exposure to COVID19 The patient denies respiratory symptoms of COVID 19 at this time. The importance of social distancing was  discussed today.     Review of Systems  Constitutional: Negative for chills and fever.  HENT: Positive for ear pain and sinus pain. Negative for sore throat.   Eyes: Negative for pain and redness.  Respiratory: Negative for cough and shortness of breath.   Cardiovascular: Positive for palpitations. Negative for chest pain and leg swelling.  Gastrointestinal: Negative for abdominal pain, blood in stool, constipation, diarrhea, nausea and vomiting.  Genitourinary: Negative for dysuria.  Musculoskeletal: Negative for falls and myalgias.  Skin: Negative for rash.  Neurological: Negative for dizziness.  Psychiatric/Behavioral: Negative for depression. The patient is not nervous/anxious.       Past Medical History:  Diagnosis Date  . Acute sinusitis, unspecified   . Dermatophytosis of nail   . Esophageal reflux   . Essential hypertension, benign   . Impotence of organic origin   . Morbid obesity ([place])   . Obstructive sleep apnea (adult) (pediatric)   . Other and unspecified hyperlipidemia   . Other malaise and fatigue   . Other testicular hypofunction   . Pure hypercholesterolemia   . Rosacea   . Routine general medical examination at a health care facility   . Sleep apnea     reports that he quit smoking about 26 years ago. He has a 15.00 pack-year smoking history. He has never used smokeless tobacco. He reports that he does not drink alcohol or use drugs.   Current Outpatient Medications:  .  ASPIRIN ADULT LOW STRENGTH  81 MG EC tablet, TAKE 1 TABLET BY MOUTH EVERY DAY, Disp: 90 tablet, Rfl: 3 .  diclofenac Sodium (VOLTAREN) 1 % GEL, APPLY 4 GRAMS TOPICALLY 4 TIMES DAILY**ASPIRIN & NSAIDS ORALLY NOT RECOMMENDED GIVEN CARDIAC ISSUES, Disp: 100 g, Rfl: 2 .  losartan (COZAAR) 100 MG tablet, TAKE 1 TABLET BY MOUTH EVERY DAY, Disp: 90 tablet, Rfl: 1 .  omeprazole (PRILOSEC) 40 MG capsule, TAKE 1 CAPSULE BY MOUTH EVERY DAY, Disp: 90 capsule, Rfl: 3 .  PRESCRIPTION MEDICATION, Inhale  into the lungs at bedtime. CPAP, Disp: , Rfl:  .  simvastatin (ZOCOR) 40 MG tablet, TAKE 1 TABLET BY MOUTH EVERY DAY, Disp: 90 tablet, Rfl: 3 .  triamterene-hydrochlorothiazide (MAXZIDE-25) 37.5-25 MG tablet, TAKE 1 TABLET BY MOUTH EVERY DAY, Disp: 90 tablet, Rfl: 1   Observations/Objective: Blood pressure 110/70, pulse 69, temperature 97.7 F (36.5 C), temperature source Temporal, height 5\' 9"  (1.753 m), weight (!) 339 lb 4 oz (153.9 kg), SpO2 97 %.  Physical Exam Constitutional:      Appearance: He is well-developed. He is obese.  HENT:     Head: Normocephalic.     Right Ear: Hearing and ear canal normal. A middle ear effusion is present. There is impacted cerumen. Tympanic membrane is injected and bulging. Tympanic membrane has decreased mobility.     Left Ear: Hearing and ear canal normal. A middle ear effusion is present. Tympanic membrane is injected and bulging. Tympanic membrane has decreased mobility.     Ears:     Comments:  Clear cerumen with simple irrigation of right ear.    Nose: Nose normal.  Neck:     Thyroid: No thyroid mass or thyromegaly.     Vascular: No carotid bruit.     Trachea: Trachea normal.  Cardiovascular:     Rate and Rhythm: Normal rate and regular rhythm.     Pulses: Normal pulses.     Heart sounds: Heart sounds not distant. No murmur. No friction rub. No gallop.      Comments: No peripheral edema Pulmonary:     Effort: Pulmonary effort is normal. No respiratory distress.     Breath sounds: Normal breath sounds.  Skin:    General: Skin is warm and dry.     Findings: No rash.  Psychiatric:        Speech: Speech normal.        Behavior: Behavior normal.        Thought Content: Thought content normal.      Assessment and Plan EKG: normal EKG, normal sinus rhythm, unchanged from previous tracings . Palpitations  No clear trigger.  Decrease already minimal caffeine. Increase water.  Eval cbc, CMET and TSH.   He will follow up with cardiology  for further eval of palpitations with possible monitor and stress test.  ER precautions given.  Non-recurrent acute suppurative otitis media of both ears without spontaneous rupture of tympanic membranes Likely cause of headache and ear pain.  Treat with flonase and antibiotics.  Essential hypertension, benign Well controlled. Continue current medication.        Eliezer Lofts, MD

## 2019-08-18 NOTE — Patient Instructions (Addendum)
   Please stop at the lab to have labs drawn. Increase water. Try to decrease caffeine.  Call to set up appt with cardiology for evaluation of palpitations.  Go to ER for persistent symptoms or chest pain.   For ears and sinuses... start flonase 2 sprays per nostril daily and complete amoxicillin x 10 days.

## 2019-08-18 NOTE — Assessment & Plan Note (Signed)
Well controlled. Continue current medication.  

## 2019-08-18 NOTE — Addendum Note (Signed)
Addended by: Ellamae Sia on: 08/18/2019 09:36 AM   Modules accepted: Orders

## 2019-08-18 NOTE — Assessment & Plan Note (Signed)
Likely cause of headache and ear pain.  Treat with flonase and antibiotics.

## 2019-08-18 NOTE — Assessment & Plan Note (Signed)
No clear trigger.  Decrease already minimal caffeine. Increase water.  Eval cbc, CMET and TSH.   He will follow up with cardiology for further eval of palpitations with possible monitor and stress test.  ER precautions given.

## 2019-08-19 ENCOUNTER — Other Ambulatory Visit: Payer: Self-pay | Admitting: Family Medicine

## 2019-08-19 MED ORDER — AMOXICILLIN 500 MG PO TABS: 500.0000 mg | ORAL_TABLET | Freq: Two times a day (BID) | ORAL | 0 refills | Status: AC

## 2019-08-19 NOTE — Progress Notes (Signed)
Pt seen 08/18/19 for b/l AOM.  Pt called after hours line as rx for amoxicillin was not at pharmacy.  Chart and allergies reviewed.  Rx sent by this provider.  Grier Mitts, MD

## 2019-08-21 ENCOUNTER — Telehealth: Payer: Self-pay

## 2019-08-21 NOTE — Telephone Encounter (Signed)
Per med list Dr Volanda Napoleon sent abx electronically on 08/19/19 to [facility].

## 2019-08-21 NOTE — Telephone Encounter (Signed)
[facility] Night - Client TELEPHONE ADVICE RECORD [facility]urse Patient Name: MARIOS YELLOWHAIR Gender: Male DOB: Jun 18, 1966 Age: 54 Y 53 M 12 D Return Phone Number: [phone] (Primary) [place] [address] Client [facility] Night - Client Client Site [place] Physician Eliezer Lofts - MD Contact Type Call Who Is Calling Patient / Member / Family / Caregiver Call Type Triage / Clinical Relationship To Patient Self Return Phone Number [phone] (Primary) Chief Complaint Headache Reason for Call Symptomatic / Request for [place] says that he was Dx with ear infection and the doctor was to call Amoxicillin but it is not at pharmacy. He has pressure in his ears and headache. Translation No Nurse Assessment Nurse: Gareth Eagle, RN, Raquel Sarna Date/Time Eilene Ghazi Time): 08/19/2019 9:17:10 AM Please select the assessment type ---RX called in but not at pharm Additional Documentation ---Caller states he was seen by PCP yesterday and dx'd with ear infection. RX was to be sent in but RX never reached pharmacy. Document the name of the medication. ---Amoxicillin Pharmacy name and phone number. ---[facility] [address], [phone] Has the office closed within the last 30 minutes? ---No Does the client directives allow for assistance with medications after hours? ---Yes Is there an on-call physician for the client? ---Yes Guidelines Guideline Title Affirmed Question Affirmed Notes Nurse Date/Time ([place] Time) Disp. Time Eilene Ghazi Time) Disposition Final User 08/19/2019 9:20:23 AM Pharmacy Call Gareth Eagle, RN, Raquel Sarna Reason: Spoke with pharmacy staff who confirmed no RX for antibioitic received for pt 08/19/2019 9:24:27 AM Send To RN Personal Gareth Eagle, RN, [facility] 08/19/2019 11:27:56 AM Attempt made - message left Theodoro Grist 08/19/2019 1:13:14 PM Called On-Call  Provider Gareth Eagle, RN, [facility] 08/19/2019 9:24:04 AM Clinical Call Yes Gareth Eagle, RN, Raquel Sarna PLEASE NOTE: All timestamps contained within this report are represented as [place] Standard Time. CONFIDENTIALTY NOTICE: This fax transmission is intended only for the addressee. It contains information that is legally privileged, confidential or otherwise protected from use or disclosure. If you are not the intended recipient, you are strictly prohibited from reviewing, disclosing, copying using or disseminating any of this information or taking any action in reliance on or regarding this information. If you have received this fax in error, please notify us immediately by telephone so that we can arrange for its return to [place]. Phone: [phone], Toll-Free: [phone], Fax: [phone] Page: 2 of 2 Call Id: [id number] Comments User: Tilden Fossa, RN Date/Time Eilene Ghazi Time): 08/19/2019 9:23:47 AM No on-call provider available until 1p today. User: Tilden Fossa, RN Date/Time Eilene Ghazi Time): 08/19/2019 1:16:01 PM Returned call to patient and informed of RX to be sent by MD Paging DoctorName Phone DateTime Result/Outcome Message Type Notes Grier Mitts- MD [phone] 08/19/2019 1:13:14 PM Called On Call Provider - Reached Doctor Paged Grier Mitts- MD 08/19/2019 1:15:15 PM Spoke with On Call - General Message Result Report given to MD. Dr. Volanda Napoleon states she will send RX for pt.

## 2019-08-23 ENCOUNTER — Encounter: Payer: Self-pay | Admitting: Internal Medicine

## 2019-08-23 ENCOUNTER — Ambulatory Visit (INDEPENDENT_AMBULATORY_CARE_PROVIDER_SITE_OTHER): Payer: BC Managed Care – PPO | Admitting: Internal Medicine

## 2019-08-23 ENCOUNTER — Other Ambulatory Visit: Payer: Self-pay

## 2019-08-23 VITALS — BP 130/72 | HR 56 | Ht 69.0 in | Wt 336.0 lb

## 2019-08-23 DIAGNOSIS — I10 Essential (primary) hypertension: Secondary | ICD-10-CM | POA: Diagnosis not present

## 2019-08-23 DIAGNOSIS — R0789 Other chest pain: Secondary | ICD-10-CM | POA: Diagnosis not present

## 2019-08-23 DIAGNOSIS — R0600 Dyspnea, unspecified: Secondary | ICD-10-CM | POA: Diagnosis not present

## 2019-08-23 DIAGNOSIS — Z8616 Personal history of COVID-19: Secondary | ICD-10-CM

## 2019-08-23 DIAGNOSIS — R0609 Other forms of dyspnea: Secondary | ICD-10-CM

## 2019-08-23 NOTE — Progress Notes (Signed)
Follow-up Outpatient Visit Date: 08/23/2019  Primary Care Provider: Jinny Sanders, MD [place] [address]  Chief Complaint: Chest pain  HPI:  Todd Owens is a 54 y.o. male with history of syncope, hypertension, hyperlipidemia, obstructive sleep apnea, and morbid obesity, who presents for follow-up of syncope thought to be vasovagal in nature.  I last saw Todd Owens in 10/2016, at which time he was doing well without any further syncopal episodes.  Echocardiogram and exercise tolerance test earlier in the year were unremarkable other than hypertensive blood pressure response to exercise and moderate LVH.  Todd Owens had been doing well until the last week or two when he developed an ear infection with associated dizziness.  He has been started on antibiotics with noticeable improvement.  Over the last 1-2 weeks, he has experienced a few episodes of chest (3x last week and once this week), which he describes as a "spasm" in the left chest.  It almost feels like a quiver and only lasts a few seconds.  There are no associated symptoms and usually occur when he lies down at night.  He does not have any exertional chest pain.  He notes that he has been doing more lifting at work recently.  He has also noticed more exertional dyspnea since contracting COVID-19 in 02/2019.  His only symptom at that time was intermittent fevers.  He denies palpitations.  He has stable dependent leg edema. --------------------------------------------------------------------------------------------------  Cardiovascular History & Procedures: Cardiovascular Problems:  Vasovagal syncope  Chest pain  Risk Factors:  Hypertension, hyperlipidemia, obesity, family history,and male gender  Cath/PCI:  None  CV Surgery:  None  EP Procedures and Devices:  None  Non-Invasive Evaluation(s):  Exercise tolerance test (08/10/16): Fair exercise capacity (6 minutes, 48 seconds) achieving 155 bpm  (91% MPHR). Hypertensive response to exercise. No significant ST-T segment or T-wave changes. Low risk study.  Transthoracic echocardiogram (07/25/16): Normal LV size with moderate LVH. LVEF 60-65%. Normal diastolic function. Mildly dilated aortic root. Mild left atrial enlargement. Normal RV size and function. No significant valvular abnormalities.  Pharmacologic myocardial perfusion stress test (04/28/11): Normal study without ischemia or scar. LVEF 54%.  Recent CV Pertinent Labs: Lab Results  Component Value Date   CHOL 125 12/07/2018   HDL 34.20 (L) 12/07/2018   LDLCALC 77 12/07/2018   TRIG 69.0 12/07/2018   CHOLHDL 4 12/07/2018   K 4.0 08/18/2019   MG 2.1 07/25/2016   BUN 14 08/18/2019   CREATININE 0.97 08/18/2019    Past medical and surgical history were reviewed and updated in EPIC.  Current Meds  Medication Sig  . amoxicillin (AMOXIL) 500 MG tablet Take 1 tablet (500 mg total) by mouth 2 (two) times daily for 7 days.  . ASPIRIN ADULT LOW STRENGTH 81 MG EC tablet TAKE 1 TABLET BY MOUTH EVERY DAY  . diclofenac Sodium (VOLTAREN) 1 % GEL APPLY 4 GRAMS TOPICALLY 4 TIMES DAILY**ASPIRIN & NSAIDS ORALLY NOT RECOMMENDED GIVEN CARDIAC ISSUES  . losartan (COZAAR) 100 MG tablet TAKE 1 TABLET BY MOUTH EVERY DAY  . omeprazole (PRILOSEC) 40 MG capsule TAKE 1 CAPSULE BY MOUTH EVERY DAY  . PRESCRIPTION MEDICATION Inhale into the lungs at bedtime. CPAP  . simvastatin (ZOCOR) 40 MG tablet TAKE 1 TABLET BY MOUTH EVERY DAY  . triamterene-hydrochlorothiazide (MAXZIDE-25) 37.5-25 MG tablet TAKE 1 TABLET BY MOUTH EVERY DAY    Allergies: Patient has no known allergies.  Social History   Tobacco Use  . Smoking status:  Former Smoker    Packs/day: 1.00    Years: 15.00    Pack years: 15.00    Quit date: 1995    Years since quitting: 26.1  . Smokeless tobacco: Never Used  Substance Use Topics  . Alcohol use: No  . Drug use: No    Family History  Problem Relation Age of Onset  .  Hypertension Mother   . Heart disease Mother        pacemaker  . Hypertension Father   . Stroke Father   . Heart disease Father 70  . Heart attack Father   . Heart disease Sister   . Heart attack Sister 39  . Heart failure Sister   . Colon cancer Neg Hx   . Esophageal cancer Neg Hx   . Liver cancer Neg Hx   . Pancreatic cancer Neg Hx   . Stomach cancer Neg Hx   . Rectal cancer Neg Hx     Review of Systems: A 12-system review of systems was performed and was negative except as noted in the HPI.  --------------------------------------------------------------------------------------------------  Physical Exam: BP 130/72 (BP Location: Left Arm, Patient Position: Sitting, Cuff Size: Large)   Pulse (!) 56   Ht 5\' 9"  (1.753 m)   Wt (!) 336 lb (152.4 kg)   SpO2 96%   BMI 49.62 kg/m   General:  NAD. HEENT: No conjunctival pallor or scleral icterus. Facemask in place. Neck: Supple without lymphadenopathy, thyromegaly, JVD, or HJR. Lungs: Normal work of breathing. Clear to auscultation bilaterally without wheezes or crackles. Heart: Distant heart sounds. Regular rate and rhythm without murmurs, rubs, or gallops. Abd: Bowel sounds present. Soft, NT/ND without. Ext: Trace pretibial edema bilaterally.  EKG:  Sinus bradycardia (HR 56 bpm).  No significant abnormality.  Lab Results  Component Value Date   WBC 5.3 08/18/2019   HGB 14.2 08/18/2019   HCT 41.2 08/18/2019   MCV 97.5 08/18/2019   PLT 249.0 08/18/2019    Lab Results  Component Value Date   NA 138 08/18/2019   K 4.0 08/18/2019   CL 101 08/18/2019   CO2 30 08/18/2019   BUN 14 08/18/2019   CREATININE 0.97 08/18/2019   GLUCOSE 112 (H) 08/18/2019   ALT 36 08/18/2019    Lab Results  Component Value Date   CHOL 125 12/07/2018   HDL 34.20 (L) 12/07/2018   LDLCALC 77 12/07/2018   TRIG 69.0 12/07/2018   CHOLHDL 4 12/07/2018     --------------------------------------------------------------------------------------------------  ASSESSMENT AND PLAN: Atypical chest pain: Pain is brief with a spasm or quiver sensation that occurs when lying down at night.  EKG today is unremarkable.  GXT in 07/2016 was low risk, though Todd Owens certainly has risk factors for CAD (hypertension, hyperlipidemia, obesity, family history,and male gender).  We will obtain an echo (as detailed below) but defer additional ischemia evaluation unless pain worsens or echo reveals cardiomyopathy.  Dyspnea on exertion and history of COVID-19 infection: DOE is likely multifactorial.  However, given that it has worsened following COVID-19 infection in 02/2019, I have recommended that we obtain an echo to exclude cardiomyopathy.  Hypertension: BP upper normal today.  We will continue losartan and triamterene-HCTZ at current doses.  Morbid obesity: BMI ~50 with multiple comorbidities (hypertension, hyperlipidemia, and obstructive sleep apnea).  Weight loss encouraged through diet and exercise.  Follow-up: Return to clinic in 3 months.  Nelva Bush, MD 08/25/2019 7:23 AM

## 2019-08-23 NOTE — Patient Instructions (Signed)
Medication Instructions:  Your physician recommends that you continue on your current medications as directed. Please refer to the Current Medication list given to you today.  *If you need a refill on your cardiac medications before your next appointment, please call your pharmacy*  Lab Work: none If you have labs (blood work) drawn today and your tests are completely normal, you will receive your results only by: Marland Kitchen MyChart Message (if you have MyChart) OR . A paper copy in the mail If you have any lab test that is abnormal or we need to change your treatment, we will call you to review the results.  Testing/Procedures: Your physician has requested that you have an echocardiogram. Echocardiography is a painless test that uses sound waves to create images of your heart. It provides your doctor with information about the size and shape of your heart and how well your heart's chambers and valves are working. This procedure takes approximately one hour. There are no restrictions for this procedure. You may get an IV, if needed, to receive an ultrasound enhancing agent through to better visualize your heart.    Follow-Up: At [facility], you and your health needs are our priority.  As part of our continuing mission to provide you with exceptional heart care, we have created designated Provider Care Teams.  These Care Teams include your primary Cardiologist (physician) and Advanced Practice Providers (APPs -  Physician Assistants and Nurse Practitioners) who all work together to provide you with the care you need, when you need it.  Your next appointment:   3 month(s)  The format for your next appointment:   In Person  Provider:    You may see DR Harrell Gave END or one of the following Advanced Practice Providers on your designated Care Team:    Murray Hodgkins, NP  Christell Faith, PA-C  Marrianne Mood, PA-C

## 2019-08-25 ENCOUNTER — Encounter: Payer: Self-pay | Admitting: Internal Medicine

## 2019-09-07 ENCOUNTER — Other Ambulatory Visit: Payer: BC Managed Care – PPO

## 2019-09-20 ENCOUNTER — Encounter: Payer: Self-pay | Admitting: Internal Medicine

## 2019-09-20 ENCOUNTER — Ambulatory Visit (INDEPENDENT_AMBULATORY_CARE_PROVIDER_SITE_OTHER): Payer: BC Managed Care – PPO

## 2019-09-20 ENCOUNTER — Encounter: Payer: Self-pay | Admitting: Cardiology

## 2019-09-20 ENCOUNTER — Other Ambulatory Visit: Payer: Self-pay

## 2019-09-20 DIAGNOSIS — R0609 Other forms of dyspnea: Secondary | ICD-10-CM

## 2019-09-20 DIAGNOSIS — R0600 Dyspnea, unspecified: Secondary | ICD-10-CM

## 2019-09-20 DIAGNOSIS — Z8616 Personal history of COVID-19: Secondary | ICD-10-CM | POA: Diagnosis not present

## 2019-09-20 MED ORDER — PERFLUTREN LIPID MICROSPHERE
1.0000 mL | INTRAVENOUS | Status: AC | PRN
  Administered 2019-09-20: 4 mL via INTRAVENOUS

## 2019-09-22 ENCOUNTER — Other Ambulatory Visit: Payer: Self-pay | Admitting: Family Medicine

## 2019-12-01 ENCOUNTER — Encounter: Payer: Self-pay | Admitting: Internal Medicine

## 2019-12-01 ENCOUNTER — Other Ambulatory Visit: Payer: Self-pay

## 2019-12-01 ENCOUNTER — Ambulatory Visit (INDEPENDENT_AMBULATORY_CARE_PROVIDER_SITE_OTHER): Payer: BC Managed Care – PPO | Admitting: Internal Medicine

## 2019-12-01 VITALS — BP 132/80 | HR 50 | Ht 69.0 in | Wt 329.4 lb

## 2019-12-01 DIAGNOSIS — I10 Essential (primary) hypertension: Secondary | ICD-10-CM

## 2019-12-01 DIAGNOSIS — R079 Chest pain, unspecified: Secondary | ICD-10-CM | POA: Diagnosis not present

## 2019-12-01 NOTE — Progress Notes (Signed)
Follow-up Outpatient Visit Date: 12/01/2019  Primary Care Provider: Jinny Sanders, MD [place] [address]  Chief Complaint: Follow-up chest pain  HPI:  [name] is a 54 y.o. male with history of syncope, hypertension, hyperlipidemia, obstructive sleep apnea, and morbid obesity, who presents for follow-up of chest pain and syncope.  I last saw him in February, at which time [name] reported new chest pain that he described as a "spasm" in the left chest lasting a few seconds, most commonly when he would lie down at night.  He also reported worsening dyspnea on exertion following COVID-19 infection last year.  Echo was technically difficult but showed preserved LVEF without any obvious valvular abnormalities.  Today, [name] reports he is doing well.  He has not had any further chest pain or significant shortness of breath.  He has occasional dependent edema, which is stable.  He denies palpitations and lightheadedness.  He is trying to lose weight by changes to his diet and through increasing activity.  He remains compliant with CPAP.  --------------------------------------------------------------------------------------------------  Cardiovascular History & Procedures: Cardiovascular Problems:  Vasovagal syncope  Chest pain  Risk Factors:  Hypertension, hyperlipidemia, obesity, family history,and male gender  Cath/PCI:  None  CV Surgery:  None  EP Procedures and Devices:  None  Non-Invasive Evaluation(s):  TTE (09/20/2019): Technically difficult study with LVEF of 55-60%.  Normal RV size and function.  Normal PA pressure.  No significant valvular abnormality.  Exercise tolerance test (08/10/16): Fair exercise capacity (6 minutes, 48 seconds) achieving 155 bpm (91% MPHR). Hypertensive response to exercise. No significant ST-T segment or T-wave changes. Low risk study.  Transthoracic echocardiogram (07/25/16): Normal LV size with moderate LVH.  LVEF 60-65%. Normal diastolic function. Mildly dilated aortic root. Mild left atrial enlargement. Normal RV size and function. No significant valvular abnormalities.  Pharmacologic myocardial perfusion stress test (04/28/11): Normal study without ischemia or scar. LVEF 54%.  Recent CV Pertinent Labs: Lab Results  Component Value Date   CHOL 125 12/07/2018   HDL 34.20 (L) 12/07/2018   LDLCALC 77 12/07/2018   TRIG 69.0 12/07/2018   CHOLHDL 4 12/07/2018   K 4.0 08/18/2019   MG 2.1 07/25/2016   BUN 14 08/18/2019   CREATININE 0.97 08/18/2019    Past medical and surgical history were reviewed and updated in EPIC.  Current Meds  Medication Sig  . acetaminophen (TYLENOL) 500 MG tablet Take 500 mg by mouth every 6 (six) hours as needed.  . ASPIRIN ADULT LOW STRENGTH 81 MG EC tablet TAKE 1 TABLET BY MOUTH EVERY DAY  . diclofenac Sodium (VOLTAREN) 1 % GEL APPLY 4 GRAMS TOPICALLY 4 TIMES DAILY**ASPIRIN & NSAIDS ORALLY NOT RECOMMENDED GIVEN CARDIAC ISSUES (Patient taking differently: Using PRN)  . losartan (COZAAR) 100 MG tablet TAKE 1 TABLET BY MOUTH EVERY DAY  . omeprazole (PRILOSEC) 40 MG capsule TAKE 1 CAPSULE BY MOUTH EVERY DAY  . PRESCRIPTION MEDICATION Inhale into the lungs at bedtime. CPAP  . simvastatin (ZOCOR) 40 MG tablet TAKE 1 TABLET BY MOUTH EVERY DAY  . triamterene-hydrochlorothiazide (MAXZIDE-25) 37.5-25 MG tablet TAKE 1 TABLET BY MOUTH EVERY DAY    Allergies: Patient has no known allergies.  Social History   Tobacco Use  . Smoking status: Former Smoker    Packs/day: 1.00    Years: 15.00    Pack years: 15.00    Quit date: 1995    Years since quitting: 26.3  . Smokeless tobacco: Never Used  Substance  Use Topics  . Alcohol use: No  . Drug use: No    Family History  Problem Relation Age of Onset  . Hypertension Mother   . Heart disease Mother        pacemaker  . Hypertension Father   . Stroke Father   . Heart disease Father 58  . Heart attack Father   . Heart  disease Sister   . Heart attack Sister 63  . Heart failure Sister   . Colon cancer Neg Hx   . Esophageal cancer Neg Hx   . Liver cancer Neg Hx   . Pancreatic cancer Neg Hx   . Stomach cancer Neg Hx   . Rectal cancer Neg Hx     Review of Systems: A 12-system review of systems was performed and was negative except as noted in the HPI.  --------------------------------------------------------------------------------------------------  Physical Exam: BP 132/80 (BP Location: Left Arm, Patient Position: Sitting, Cuff Size: Large)   Pulse (!) 50   Ht 5\' 9"  (1.753 m)   Wt (!) 329 lb 6 oz (149.4 kg)   SpO2 98%   BMI 48.64 kg/m   General: NAD. HEENT: No conjunctival pallor or scleral icterus. Facemask in place. Neck: No JVD or HJR. Lungs: Normal work of breathing. Clear to auscultation bilaterally without wheezes or crackles. Heart: Bradycardic but regular without murmurs, rubs, or gallops. Abd: Bowel sounds present. Soft, NT/ND. Ext: No lower extremity edema.  EKG: Sinus bradycardia with sinus arrhythmia.  Otherwise, no significant abnormality.  Lab Results  Component Value Date   WBC 5.3 08/18/2019   HGB 14.2 08/18/2019   HCT 41.2 08/18/2019   MCV 97.5 08/18/2019   PLT 249.0 08/18/2019    Lab Results  Component Value Date   NA 138 08/18/2019   K 4.0 08/18/2019   CL 101 08/18/2019   CO2 30 08/18/2019   BUN 14 08/18/2019   CREATININE 0.97 08/18/2019   GLUCOSE 112 (H) 08/18/2019   ALT 36 08/18/2019    Lab Results  Component Value Date   CHOL 125 12/07/2018   HDL 34.20 (L) 12/07/2018   LDLCALC 77 12/07/2018   TRIG 69.0 12/07/2018   CHOLHDL 4 12/07/2018    --------------------------------------------------------------------------------------------------  ASSESSMENT AND PLAN: Atypical chest pain: No further episodes since our last visit.  Quality and timing of pain most consistent with musculoskeletal etiology.  Recent echo, albeit technically difficult, was  reassuring.  Given the lack of further symptoms, we have agreed to defer additional testing at this time.  Continue primary prevention, including blood pressure control and weight loss.  Hypertension: Blood pressure upper normal today.  No medication changes at this time.  Morbid obesity: I congratulated Mr. Kapral on losing about 10 pounds over the last 4 to 5 months.  I encouraged him to keep working on weight loss through diet and exercise.  Follow-up: Return to clinic in 1 year.  Nelva Bush, MD 12/01/2019 10:52 AM

## 2019-12-01 NOTE — Patient Instructions (Signed)
Medication Instructions:  Your physician recommends that you continue on your current medications as directed. Please refer to the Current Medication list given to you today.  *If you need a refill on your cardiac medications before your next appointment, please call your pharmacy*   Lab Work: None ordered If you have labs (blood work) drawn today and your tests are completely normal, you will receive your results only by: Marland Kitchen MyChart Message (if you have MyChart) OR . A paper copy in the mail If you have any lab test that is abnormal or we need to change your treatment, we will call you to review the results.   Testing/Procedures: None ordered   Follow-Up: At [facility], you and your health needs are our priority.  As part of our continuing mission to provide you with exceptional heart care, we have created designated Provider Care Teams.  These Care Teams include your primary Cardiologist (physician) and Advanced Practice Providers (APPs -  Physician Assistants and Nurse Practitioners) who all work together to provide you with the care you need, when you need it.  We recommend signing up for the patient portal called "MyChart".  Sign up information is provided on this After Visit Summary.  MyChart is used to connect with patients for Virtual Visits (Telemedicine).  Patients are able to view lab/test results, encounter notes, upcoming appointments, etc.  Non-urgent messages can be sent to your provider as well.   To learn more about what you can do with MyChart, go to [email or web address].    Your next appointment:   12 month(s)  The format for your next appointment:   In Person  Provider:    You may see Dr. Saunders Revel or one of the following Advanced Practice Providers on your designated Care Team:    Murray Hodgkins, NP  Christell Faith, PA-C  Marrianne Mood, PA-C    Other Instructions N/A

## 2019-12-05 ENCOUNTER — Telehealth: Payer: Self-pay | Admitting: Family Medicine

## 2019-12-05 DIAGNOSIS — E221 Hyperprolactinemia: Secondary | ICD-10-CM

## 2019-12-05 DIAGNOSIS — Z125 Encounter for screening for malignant neoplasm of prostate: Secondary | ICD-10-CM

## 2019-12-05 DIAGNOSIS — R7303 Prediabetes: Secondary | ICD-10-CM

## 2019-12-05 DIAGNOSIS — E349 Endocrine disorder, unspecified: Secondary | ICD-10-CM

## 2019-12-05 DIAGNOSIS — E785 Hyperlipidemia, unspecified: Secondary | ICD-10-CM

## 2019-12-05 NOTE — Telephone Encounter (Signed)
-----   Message from Ellamae Sia sent at 11/23/2019  9:53 AM EDT ----- Regarding: Lab orders for Wednesday, 5.19.21 Patient is scheduled for CPX labs, please order future labs, Thanks , Karna Christmas

## 2019-12-06 ENCOUNTER — Other Ambulatory Visit: Payer: Self-pay

## 2019-12-06 ENCOUNTER — Other Ambulatory Visit (INDEPENDENT_AMBULATORY_CARE_PROVIDER_SITE_OTHER): Payer: BC Managed Care – PPO

## 2019-12-06 DIAGNOSIS — R7303 Prediabetes: Secondary | ICD-10-CM | POA: Diagnosis not present

## 2019-12-06 DIAGNOSIS — Z125 Encounter for screening for malignant neoplasm of prostate: Secondary | ICD-10-CM

## 2019-12-06 DIAGNOSIS — E785 Hyperlipidemia, unspecified: Secondary | ICD-10-CM

## 2019-12-06 DIAGNOSIS — E221 Hyperprolactinemia: Secondary | ICD-10-CM

## 2019-12-06 DIAGNOSIS — E349 Endocrine disorder, unspecified: Secondary | ICD-10-CM

## 2019-12-06 LAB — COMPREHENSIVE METABOLIC PANEL
ALT: 25 U/L (ref 0–53)
AST: 28 U/L (ref 0–37)
Albumin: 4.1 g/dL (ref 3.5–5.2)
Alkaline Phosphatase: 56 U/L (ref 39–117)
BUN: 18 mg/dL (ref 6–23)
CO2: 30 mEq/L (ref 19–32)
Calcium: 9.3 mg/dL (ref 8.4–10.5)
Chloride: 100 mEq/L (ref 96–112)
Creatinine, Ser: 1.08 mg/dL (ref 0.40–1.50)
GFR: 71.32 mL/min (ref >60.00)
Glucose, Bld: 114 mg/dL — ABNORMAL HIGH (ref 70–99)
Potassium: 3.7 mEq/L (ref 3.5–5.1)
Sodium: 139 mEq/L (ref 135–145)
Total Bilirubin: 0.5 mg/dL (ref 0.2–1.2)
Total Protein: 6.6 g/dL (ref 6.0–8.3)

## 2019-12-06 LAB — LIPID PANEL
Cholesterol: 134 mg/dL (ref 0–200)
HDL: 30.9 mg/dL — ABNORMAL LOW (ref >39.00)
LDL Cholesterol: 81 mg/dL (ref 0–99)
NonHDL: 103.33
Total CHOL/HDL Ratio: 4
Triglycerides: 110 mg/dL (ref 0.0–149.0)
VLDL: 22 mg/dL (ref 0.0–40.0)

## 2019-12-06 LAB — PSA: PSA: 0.23 ng/mL (ref 0.10–4.00)

## 2019-12-06 LAB — HEMOGLOBIN A1C: Hgb A1c MFr Bld: 6.1 % (ref 4.6–6.5)

## 2019-12-07 ENCOUNTER — Other Ambulatory Visit: Payer: BC Managed Care – PPO

## 2019-12-07 NOTE — Progress Notes (Signed)
No critical labs need to be addressed urgently. We will discuss labs in detail at upcoming office visit.   

## 2019-12-12 ENCOUNTER — Encounter: Payer: BC Managed Care – PPO | Admitting: Family Medicine

## 2019-12-12 ENCOUNTER — Ambulatory Visit (INDEPENDENT_AMBULATORY_CARE_PROVIDER_SITE_OTHER): Payer: BC Managed Care – PPO | Admitting: Family Medicine

## 2019-12-12 ENCOUNTER — Encounter: Payer: Self-pay | Admitting: Family Medicine

## 2019-12-12 ENCOUNTER — Other Ambulatory Visit: Payer: Self-pay

## 2019-12-12 VITALS — BP 120/62 | HR 75 | Temp 97.5°F | Ht 69.0 in | Wt 330.5 lb

## 2019-12-12 DIAGNOSIS — G4733 Obstructive sleep apnea (adult) (pediatric): Secondary | ICD-10-CM

## 2019-12-12 DIAGNOSIS — R7303 Prediabetes: Secondary | ICD-10-CM | POA: Diagnosis not present

## 2019-12-12 DIAGNOSIS — Z6841 Body Mass Index (BMI) 40.0 and over, adult: Secondary | ICD-10-CM

## 2019-12-12 DIAGNOSIS — E785 Hyperlipidemia, unspecified: Secondary | ICD-10-CM

## 2019-12-12 DIAGNOSIS — E221 Hyperprolactinemia: Secondary | ICD-10-CM | POA: Diagnosis not present

## 2019-12-12 DIAGNOSIS — I10 Essential (primary) hypertension: Secondary | ICD-10-CM | POA: Diagnosis not present

## 2019-12-12 DIAGNOSIS — Z0000 Encounter for general adult medical examination without abnormal findings: Secondary | ICD-10-CM

## 2019-12-12 NOTE — Assessment & Plan Note (Signed)
Well controlled. Continue current medication.  

## 2019-12-12 NOTE — Assessment & Plan Note (Signed)
Stable on CPAP 

## 2019-12-12 NOTE — Progress Notes (Signed)
Chief Complaint  Patient presents with  . Annual Exam    History of Present Illness: HPI The patient is here for annual wellness exam and preventative care.    No acute issues.    Office Visit from 12/12/2019 in [place] at [facility] Total Score  0       Prediabetes Lab Results  Component Value Date   HGBA1C 6.1 12/06/2019    Testosterone deficiency: Not tested  Hypertension:    Good control on losartan and triamterene HCTZ. BP Readings from Last 3 Encounters:  12/12/19 120/62  12/01/19 132/80  08/23/19 130/72  Using medication without problems or lightheadedness: none Chest pain with exertion: Edema: Short of breath: Average home BPs: Other issues:  Elevated Cholesterol:  LDL at goal on simvastatin. Lab Results  Component Value Date   CHOL 134 12/06/2019   HDL 30.90 (L) 12/06/2019   LDLCALC 81 12/06/2019   TRIG 110.0 12/06/2019   CHOLHDL 4 12/06/2019  Using medications without problems:none Muscle aches: none Diet compliance: moderate Exercise: mowing  yards daily Other complaints:  Hyperprolactinemia: normal prolactin.   OSA: on CPAP  This visit occurred during the SARS-CoV-2 public health emergency.  Safety protocols were in place, including screening questions prior to the visit, additional usage of staff PPE, and extensive cleaning of exam room while observing appropriate contact time as indicated for disinfecting solutions.   COVID 19 screen:  No recent travel or known exposure to COVID19 The patient denies respiratory symptoms of COVID 19 at this time. The importance of social distancing was discussed today.     Review of Systems  Constitutional: Negative for chills and fever.  HENT: Negative for congestion and ear pain.   Eyes: Negative for pain and redness.  Respiratory: Negative for cough and shortness of breath.   Cardiovascular: Negative for chest pain, palpitations and leg swelling.  Gastrointestinal: Negative for  abdominal pain, blood in stool, constipation, diarrhea, nausea and vomiting.  Genitourinary: Negative for dysuria.  Musculoskeletal: Negative for falls and myalgias.  Skin: Negative for rash.  Neurological: Negative for dizziness.  Psychiatric/Behavioral: Negative for depression. The patient is not nervous/anxious.       Past Medical History:  Diagnosis Date  . Acute sinusitis, unspecified   . Dermatophytosis of nail   . Esophageal reflux   . Essential hypertension, benign   . Impotence of organic origin   . Morbid obesity ([place])   . Obstructive sleep apnea (adult) (pediatric)   . Other and unspecified hyperlipidemia   . Other malaise and fatigue   . Other testicular hypofunction   . Pure hypercholesterolemia   . Rosacea   . Routine general medical examination at a health care facility   . Sleep apnea     reports that he quit smoking about 26 years ago. He has a 15.00 pack-year smoking history. He has never used smokeless tobacco. He reports that he does not drink alcohol or use drugs.   Current Outpatient Medications:  .  acetaminophen (TYLENOL) 500 MG tablet, Take 500 mg by mouth every 6 (six) hours as needed., Disp: , Rfl:  .  ASPIRIN ADULT LOW STRENGTH 81 MG EC tablet, TAKE 1 TABLET BY MOUTH EVERY DAY, Disp: 90 tablet, Rfl: 3 .  diclofenac Sodium (VOLTAREN) 1 % GEL, APPLY 4 GRAMS TOPICALLY 4 TIMES DAILY**ASPIRIN & NSAIDS ORALLY NOT RECOMMENDED GIVEN CARDIAC ISSUES (Patient taking differently: Using PRN), Disp: 100 g, Rfl: 2 .  losartan (COZAAR) 100 MG tablet, TAKE 1 TABLET  BY MOUTH EVERY DAY, Disp: 90 tablet, Rfl: 1 .  omeprazole (PRILOSEC) 40 MG capsule, TAKE 1 CAPSULE BY MOUTH EVERY DAY, Disp: 90 capsule, Rfl: 3 .  PRESCRIPTION MEDICATION, Inhale into the lungs at bedtime. CPAP, Disp: , Rfl:  .  simvastatin (ZOCOR) 40 MG tablet, TAKE 1 TABLET BY MOUTH EVERY DAY, Disp: 90 tablet, Rfl: 3 .  triamterene-hydrochlorothiazide (MAXZIDE-25) 37.5-25 MG tablet, TAKE 1 TABLET BY MOUTH  EVERY DAY, Disp: 90 tablet, Rfl: 1   Observations/Objective: Blood pressure 120/62, pulse 75, temperature (!) 97.5 F (36.4 C), temperature source Temporal, height 5\' 9"  (1.753 m), weight (!) 330 lb 8 oz (149.9 kg), SpO2 94 %.  Physical Exam Constitutional:      General: He is not in acute distress.    Appearance: Normal appearance. He is well-developed. He is obese. He is not ill-appearing or toxic-appearing.  HENT:     Head: Normocephalic and atraumatic.     Right Ear: Hearing, tympanic membrane, ear canal and external ear normal.     Left Ear: Hearing, tympanic membrane, ear canal and external ear normal.     Nose: Nose normal.     Mouth/Throat:     Pharynx: Uvula midline.  Eyes:     General: Lids are normal. Lids are everted, no foreign bodies appreciated.     Conjunctiva/sclera: Conjunctivae normal.     Pupils: Pupils are equal, round, and reactive to light.  Neck:     Thyroid: No thyroid mass or thyromegaly.     Vascular: No carotid bruit.     Trachea: Trachea and phonation normal.  Cardiovascular:     Rate and Rhythm: Normal rate and regular rhythm.     Pulses: Normal pulses.     Heart sounds: S1 normal and S2 normal. No murmur. No gallop.   Pulmonary:     Breath sounds: Normal breath sounds. No wheezing, rhonchi or rales.  Abdominal:     General: Bowel sounds are normal.     Palpations: Abdomen is soft.     Tenderness: There is no abdominal tenderness. There is no guarding or rebound.     Hernia: No hernia is present.  Musculoskeletal:     Cervical back: Normal range of motion and neck supple.  Lymphadenopathy:     Cervical: No cervical adenopathy.  Skin:    General: Skin is warm and dry.     Findings: No rash.  Neurological:     Mental Status: He is alert.     Cranial Nerves: No cranial nerve deficit.     Sensory: No sensory deficit.     Gait: Gait normal.     Deep Tendon Reflexes: Reflexes are normal and symmetric.  Psychiatric:        Speech: Speech  normal.        Behavior: Behavior normal.        Judgment: Judgment normal.      Assessment and Plan   The patient's preventative maintenance and recommended screening tests for an annual wellness exam were reviewed in full today. Brought up to date unless services declined.  Counselled on the importance of diet, exercise, and its role in overall health and mortality. The patient's FH and SH was reviewed, including their home life, tobacco status, and drug and alcohol status.   Vaccine: Td 2014.  Discussed COVID19 vaccine side effects and benefits. Strongly encouraged the patient to get the vaccine. Questions answered. Former smoker, Quit 15-20 years ago Prostate, Lab Results  Component Value Date  PSA 0.23 12/06/2019   PSA 0.24 12/07/2018   PSA 0.31 12/03/2017  Colon cancer screen:  Tubular adenoma 02/18/2018.. plan repeat in 5 years, Dr. Silverio Decamp. No STD testing requested. [id number] Hep C:not indicated. No genital exam.  Essential hypertension, benign Good control on losartan and triamterene HCTZ.  Hyperlipidemia Well controlled. Continue current medication.   Hyperprolactinemia Prolactin now normalized.  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     Eliezer Lofts, MD

## 2019-12-12 NOTE — Assessment & Plan Note (Signed)
Prolactin now normalized.

## 2019-12-12 NOTE — Assessment & Plan Note (Signed)
Encouraged exercise, weight loss, healthy eating habits. ? ?

## 2019-12-12 NOTE — Assessment & Plan Note (Signed)
Good control on losartan and triamterene HCTZ.

## 2019-12-13 LAB — TESTOS,TOTAL,FREE AND SHBG (FEMALE)
Free Testosterone: 29 pg/mL — ABNORMAL LOW (ref 35.0–155.0)
Sex Hormone Binding: 19 nmol/L (ref 10–50)
Testosterone, Total, LC-MS-MS: 207 ng/dL — ABNORMAL LOW (ref 250–1100)

## 2019-12-13 LAB — PROLACTIN: Prolactin: 17.4 ng/mL (ref 2.0–18.0)

## 2019-12-20 ENCOUNTER — Other Ambulatory Visit: Payer: Self-pay | Admitting: Family Medicine

## 2020-01-07 ENCOUNTER — Other Ambulatory Visit: Payer: Self-pay | Admitting: Family Medicine

## 2020-03-17 ENCOUNTER — Other Ambulatory Visit: Payer: Self-pay | Admitting: Family Medicine

## 2020-05-23 ENCOUNTER — Encounter: Payer: Self-pay | Admitting: Pulmonary Disease

## 2020-05-23 ENCOUNTER — Ambulatory Visit (INDEPENDENT_AMBULATORY_CARE_PROVIDER_SITE_OTHER): Payer: BC Managed Care – PPO | Admitting: Pulmonary Disease

## 2020-05-23 ENCOUNTER — Other Ambulatory Visit: Payer: Self-pay

## 2020-05-23 DIAGNOSIS — G4733 Obstructive sleep apnea (adult) (pediatric): Secondary | ICD-10-CM

## 2020-05-23 DIAGNOSIS — E8881 Metabolic syndrome: Secondary | ICD-10-CM

## 2020-05-23 NOTE — Patient Instructions (Signed)
  CPAP is working well on current settings CPAP supplies will be renewed for a year

## 2020-05-23 NOTE — Assessment & Plan Note (Signed)
CPAP download was reviewed which shows excellent compliance more than 8 hours every night good control of events, average pressure 14 cm.  He is maintained on auto setting 11 to 15 cm. CPAP is certainly helped improve his daytime somnolence and fatigue  Weight loss encouraged, compliance with goal of at least 4-6 hrs every night is the expectation. Advised against medications with sedative side effects Cautioned against driving when sleepy - understanding that sleepiness will vary on a day to day basis

## 2020-05-23 NOTE — Progress Notes (Signed)
   Subjective:    Patient ID: Todd Owens, male    DOB: Oct 08, 1965, 54 y.o.   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 No problems with mask or pressure.  He has a nasal mask, denies any dryness. Denies any problems driving, no sleep pressure in the afternoons.  He had Covid infection in August, did not have to go to the hospital, he has not obtained the vaccine yet   Significant tests/ events reviewed  NPSG 05/2016 >AHI 29/hr .    Review of Systems neg for any significant sore throat, dysphagia, itching, sneezing, nasal congestion or excess/ purulent secretions, fever, chills, sweats, unintended wt loss, pleuritic or exertional cp, hempoptysis, orthopnea pnd or change in chronic leg swelling. Also denies presyncope, palpitations, heartburn, abdominal pain, nausea, vomiting, diarrhea or change in bowel or urinary habits, dysuria,hematuria, rash, arthralgias, visual complaints, headache, numbness weakness or ataxia.     Objective:   Physical Exam   Gen. Pleasant, obese, in no distress ENT - no lesions, no post nasal drip Neck: No JVD, no thyromegaly, no carotid bruits Lungs: no use of accessory muscles, no dullness to percussion, decreased without rales or rhonchi  Cardiovascular: Rhythm regular, heart sounds  normal, no murmurs or gallops, no peripheral edema Musculoskeletal: No deformities, no cyanosis or clubbing , no tremors        Assessment & Plan:

## 2020-05-23 NOTE — Assessment & Plan Note (Signed)
Weight loss encouraged. Also encouraged him to get the Covid vaccine

## 2020-06-28 ENCOUNTER — Telehealth (INDEPENDENT_AMBULATORY_CARE_PROVIDER_SITE_OTHER): Payer: BC Managed Care – PPO | Admitting: Family Medicine

## 2020-06-28 ENCOUNTER — Other Ambulatory Visit: Payer: Self-pay

## 2020-06-28 ENCOUNTER — Encounter: Payer: Self-pay | Admitting: Family Medicine

## 2020-06-28 VITALS — Ht 69.0 in | Wt 334.0 lb

## 2020-06-28 DIAGNOSIS — H938X3 Other specified disorders of ear, bilateral: Secondary | ICD-10-CM | POA: Diagnosis not present

## 2020-06-28 MED ORDER — AMOXICILLIN 500 MG PO CAPS: 1000.0000 mg | ORAL_CAPSULE | Freq: Two times a day (BID) | ORAL | 0 refills | Status: DC

## 2020-06-28 NOTE — Patient Instructions (Signed)
Stop sudafed.  Start mucinex plain , continue antihistamine.  Start flonase 2 sprtays per nostril daily.  Can also use nasal saline spray prior to flonase.  If not improving in 3-4 day fill antibiotics.

## 2020-06-28 NOTE — Assessment & Plan Note (Signed)
New   Stop sudafed.  Start mucinex plain , continue antihistamine.  Start flonase 2 sprtays per nostril daily.  Can also use nasal saline spray prior to flonase.  If not improving in 3-4 day fill antibiotics.

## 2020-06-28 NOTE — Progress Notes (Signed)
VIRTUAL VISIT Due to national recommendations of social distancing due to [place] 19, a virtual visit is felt to be most appropriate for this patient at this time.   I connected with the patient on 06/28/20 at  3:40 PM EST by virtual telehealth platform and verified that I am speaking with the correct person using two identifiers.   I discussed the limitations, risks, security and privacy concerns of performing an evaluation and management service by  virtual telehealth platform and the availability of in person appointments. I also discussed with the patient that there may be a patient responsible charge related to this service. The patient expressed understanding and agreed to proceed.  Patient location: Home Provider Location: [facility] Participants: Eliezer Lofts and Vernetta Honey   Chief Complaint  Patient presents with  . Acute Visit    Sinus drainage, cough, ear ache/fullness feeling   Sinusitis This is a new problem. The current episode started 1 to 4 weeks ago (2 weeks, started after raking leaves). The problem has been gradually worsening since onset. There has been no fever. The pain is mild. Associated symptoms include congestion, coughing, ear pain and sneezing. Pertinent negatives include no headaches, neck pain, shortness of breath or sinus pressure. (Scratchy throat, left ear stopped up and right seem like water in it, only occ cough, scratchy throat, no facial pain,  no SOb, no wheeze) Treatments tried: has used sudafed, zyrtec. The treatment provided mild relief.     History of Present Illness:   No COVID test in last 2 week.  No COVID vaccine.   COVID 19 screen No recent travel or known exposure to [place]  The importance of social distancing was discussed today.   Review of Systems  HENT: Positive for congestion, ear pain and sneezing. Negative for sinus pressure.   Respiratory: Positive for cough. Negative for shortness of breath.   Musculoskeletal:  Negative for neck pain.  Neurological: Negative for headaches.      Past Medical History:  Diagnosis Date  . Acute sinusitis, unspecified   . Dermatophytosis of nail   . Esophageal reflux   . Essential hypertension, benign   . Impotence of organic origin   . Morbid obesity ([place])   . Obstructive sleep apnea (adult) (pediatric)   . Other and unspecified hyperlipidemia   . Other malaise and fatigue   . Other testicular hypofunction   . Pure hypercholesterolemia   . Rosacea   . Routine general medical examination at a health care facility   . Sleep apnea     reports that he quit smoking about 26 years ago. He has a 15.00 pack-year smoking history. He has never used smokeless tobacco. He reports that he does not drink alcohol and does not use drugs.   Current Outpatient Medications:  .  ASPIRIN LOW DOSE 81 MG EC tablet, TAKE 1 TABLET BY MOUTH EVERY DAY, Disp: 90 tablet, Rfl: 3 .  diclofenac Sodium (VOLTAREN) 1 % GEL, APPLY 4 GRAMS TOPICALLY 4 TIMES DAILY**ASPIRIN & NSAIDS ORALLY NOT RECOMMENDED GIVEN CARDIAC ISSUES (Patient taking differently: Using PRN), Disp: 100 g, Rfl: 2 .  losartan (COZAAR) 100 MG tablet, TAKE 1 TABLET BY MOUTH EVERY DAY, Disp: 90 tablet, Rfl: 1 .  omeprazole (PRILOSEC) 40 MG capsule, TAKE 1 CAPSULE BY MOUTH EVERY DAY, Disp: 90 capsule, Rfl: 3 .  PRESCRIPTION MEDICATION, Inhale into the lungs at bedtime. CPAP, Disp: , Rfl:  .  simvastatin (ZOCOR) 40 MG tablet, TAKE 1 TABLET BY MOUTH  EVERY DAY, Disp: 90 tablet, Rfl: 3 .  triamterene-hydrochlorothiazide (MAXZIDE-25) 37.5-25 MG tablet, TAKE 1 TABLET BY MOUTH EVERY DAY, Disp: 90 tablet, Rfl: 1   Observations/Objective: Height 5\' 9"  (1.753 m), weight (!) 334 lb (151.5 kg).  Physical Exam  Physical Exam Constitutional:      General: The patient is not in acute distress. Pulmonary:     Effort: Pulmonary effort is normal. No respiratory distress.  Neurological:     Mental Status: The patient is alert and oriented to  person, place, and time.  Psychiatric:        Mood and Affect: Mood normal.        Behavior: Behavior normal.   Assessment and Plan Ear fullness, bilateral New   Stop sudafed.  Start mucinex plain , continue antihistamine.  Start flonase 2 sprtays per nostril daily.  Can also use nasal saline spray prior to flonase.  If not improving in 3-4 day fill antibiotics.  Meds ordered this encounter  Medications  . amoxicillin (AMOXIL) 500 MG capsule    Sig: Take 2 capsules (1,000 mg total) by mouth 2 (two) times daily. Fill if not improving after 3=4 days, VOID after 1/1//2022    Dispense:  40 capsule    Refill:  0      I discussed the assessment and treatment plan with the patient. The patient was provided an opportunity to ask questions and all were answered. The patient agreed with the plan and demonstrated an understanding of the instructions.   The patient was advised to call back or seek an in-person evaluation if the symptoms worsen or if the condition fails to improve as anticipated.     Eliezer Lofts, MD

## 2020-07-18 ENCOUNTER — Other Ambulatory Visit: Payer: Self-pay | Admitting: Family Medicine

## 2020-09-27 ENCOUNTER — Other Ambulatory Visit: Payer: Self-pay | Admitting: Family Medicine

## 2020-10-31 ENCOUNTER — Ambulatory Visit (INDEPENDENT_AMBULATORY_CARE_PROVIDER_SITE_OTHER): Payer: Self-pay | Admitting: Family Medicine

## 2020-10-31 ENCOUNTER — Other Ambulatory Visit: Payer: Self-pay

## 2020-10-31 VITALS — BP 118/70 | HR 60 | Temp 98.0°F | Ht 69.0 in | Wt 339.5 lb

## 2020-10-31 DIAGNOSIS — H65112 Acute and subacute allergic otitis media (mucoid) (sanguinous) (serous), left ear: Secondary | ICD-10-CM

## 2020-10-31 DIAGNOSIS — L98491 Non-pressure chronic ulcer of skin of other sites limited to breakdown of skin: Secondary | ICD-10-CM

## 2020-10-31 MED ORDER — MUPIROCIN 2 % EX OINT: 1.0000 "application " | TOPICAL_OINTMENT | Freq: Two times a day (BID) | CUTANEOUS | 0 refills | Status: DC

## 2020-10-31 MED ORDER — AMOXICILLIN-POT CLAVULANATE 875-125 MG PO TABS: 1.0000 | ORAL_TABLET | Freq: Two times a day (BID) | ORAL | 0 refills | Status: DC

## 2020-10-31 NOTE — Patient Instructions (Addendum)
#  Ear infection - take antibiotic - continue Flonase - can try pseudoephedrine if severe pressure  #skin lesion - mupirocin ointment - loose clothing - cover with bandage
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
## 2020-11-08 ENCOUNTER — Emergency Department (HOSPITAL_COMMUNITY)
Admission: EM | Admit: 2020-11-08 | Discharge: 2020-11-08 | Disposition: A | Discharge status: Home / Self Care | Payer: No Typology Code available for payment source | Attending: Emergency Medicine | Admitting: Emergency Medicine

## 2020-11-08 ENCOUNTER — Other Ambulatory Visit: Payer: Self-pay

## 2020-11-08 ENCOUNTER — Encounter (HOSPITAL_COMMUNITY): Payer: Self-pay

## 2020-11-08 DIAGNOSIS — Z79899 Other long term (current) drug therapy: Secondary | ICD-10-CM | POA: Diagnosis not present

## 2020-11-08 DIAGNOSIS — M542 Cervicalgia: Secondary | ICD-10-CM | POA: Diagnosis not present

## 2020-11-08 DIAGNOSIS — M25561 Pain in right knee: Secondary | ICD-10-CM | POA: Diagnosis not present

## 2020-11-08 DIAGNOSIS — S80811A Abrasion, right lower leg, initial encounter: Secondary | ICD-10-CM | POA: Insufficient documentation

## 2020-11-08 DIAGNOSIS — R519 Headache, unspecified: Secondary | ICD-10-CM | POA: Insufficient documentation

## 2020-11-08 DIAGNOSIS — Y92481 Parking lot as the place of occurrence of the external cause: Secondary | ICD-10-CM | POA: Insufficient documentation

## 2020-11-08 DIAGNOSIS — I10 Essential (primary) hypertension: Secondary | ICD-10-CM | POA: Insufficient documentation

## 2020-11-08 DIAGNOSIS — M25562 Pain in left knee: Secondary | ICD-10-CM | POA: Insufficient documentation

## 2020-11-08 DIAGNOSIS — Z87891 Personal history of nicotine dependence: Secondary | ICD-10-CM | POA: Diagnosis not present

## 2020-11-08 DIAGNOSIS — Z7982 Long term (current) use of aspirin: Secondary | ICD-10-CM | POA: Diagnosis not present

## 2020-11-08 DIAGNOSIS — Z23 Encounter for immunization: Secondary | ICD-10-CM | POA: Diagnosis not present

## 2020-11-08 DIAGNOSIS — S8991XA Unspecified injury of right lower leg, initial encounter: Secondary | ICD-10-CM | POA: Diagnosis present

## 2020-11-08 MED ORDER — LIDOCAINE 5 % EX PTCH
1.0000 | MEDICATED_PATCH | Freq: Once | CUTANEOUS | Status: DC
  Administered 2020-11-08: 1 via TRANSDERMAL
  Filled 2020-11-08: qty 1

## 2020-11-08 MED ORDER — CYCLOBENZAPRINE HCL 10 MG PO TABS: 10.0000 mg | ORAL_TABLET | Freq: Two times a day (BID) | ORAL | 0 refills | Status: DC | PRN

## 2020-11-08 MED ORDER — ACETAMINOPHEN 325 MG PO TABS
650.0000 mg | ORAL_TABLET | Freq: Once | ORAL | Status: AC
  Administered 2020-11-08: 650 mg via ORAL
  Filled 2020-11-08: qty 2

## 2020-11-08 MED ORDER — BACITRACIN ZINC 500 UNIT/GM EX OINT
TOPICAL_OINTMENT | Freq: Once | CUTANEOUS | Status: AC
  Filled 2020-11-08: qty 0.9

## 2020-11-08 MED ORDER — TETANUS-DIPHTH-ACELL PERTUSSIS 5-2.5-18.5 LF-MCG/0.5 IM SUSY
0.5000 mL | PREFILLED_SYRINGE | Freq: Once | INTRAMUSCULAR | Status: AC
  Administered 2020-11-08: 0.5 mL via INTRAMUSCULAR
  Filled 2020-11-08: qty 0.5
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
## 2020-11-14 ENCOUNTER — Encounter: Payer: Self-pay | Admitting: Family Medicine

## 2020-11-14 ENCOUNTER — Other Ambulatory Visit: Payer: Self-pay

## 2020-11-14 ENCOUNTER — Ambulatory Visit (INDEPENDENT_AMBULATORY_CARE_PROVIDER_SITE_OTHER): Payer: BC Managed Care – PPO | Admitting: Family Medicine

## 2020-11-14 VITALS — BP 130/70 | HR 85 | Temp 98.4°F | Ht 69.0 in | Wt 341.0 lb

## 2020-11-14 DIAGNOSIS — S46819A Strain of other muscles, fascia and tendons at shoulder and upper arm level, unspecified arm, initial encounter: Secondary | ICD-10-CM | POA: Diagnosis not present

## 2020-11-14 MED ORDER — CYCLOBENZAPRINE HCL 10 MG PO TABS: 10.0000 mg | ORAL_TABLET | Freq: Every evening | ORAL | 0 refills | Status: DC | PRN

## 2020-11-14 MED ORDER — DICLOFENAC SODIUM 75 MG PO TBEC: 75.0000 mg | DELAYED_RELEASE_TABLET | Freq: Two times a day (BID) | ORAL | 0 refills | Status: DC
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
## 2020-11-14 NOTE — Patient Instructions (Addendum)
Can use heat on neck.  Start antinflamatory.. diclofenac twice daily.  Start muscle relaxant at night.  Massage on neck.  Can start stretching of upper back but not repetitive movement of neck.  No heavy lifting > 10 lbs.  If not improving in 2 weeks.. follow up for re-evaluation and possible X-ray.    

## 2020-11-27 ENCOUNTER — Other Ambulatory Visit: Payer: Self-pay | Admitting: Family Medicine
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
## 2020-12-06 ENCOUNTER — Other Ambulatory Visit: Payer: Self-pay | Admitting: Family Medicine

## 2020-12-06 ENCOUNTER — Ambulatory Visit: Payer: BC Managed Care – PPO | Admitting: Family Medicine

## 2020-12-06 ENCOUNTER — Ambulatory Visit (INDEPENDENT_AMBULATORY_CARE_PROVIDER_SITE_OTHER)
Admission: RE | Admit: 2020-12-06 | Discharge: 2020-12-06 | Disposition: A | Discharge status: Home / Self Care | Payer: BC Managed Care – PPO | Source: Ambulatory Visit | Attending: Family Medicine | Admitting: Family Medicine

## 2020-12-06 ENCOUNTER — Other Ambulatory Visit: Payer: Self-pay

## 2020-12-06 VITALS — BP 120/68 | HR 87 | Temp 97.2°F | Ht 69.0 in | Wt 340.5 lb

## 2020-12-06 DIAGNOSIS — M542 Cervicalgia: Secondary | ICD-10-CM | POA: Diagnosis not present

## 2020-12-06 MED ORDER — PREDNISONE 20 MG PO TABS: ORAL_TABLET | ORAL | 0 refills | Status: DC
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
## 2020-12-06 NOTE — Assessment & Plan Note (Signed)
Given no improvement and focal vertebral pain.Marland Kitchen eval with X-ray.  If no fracture consider PT and prednisone as next treatment.   no S/S of cervical radiculopathy.

## 2020-12-13 ENCOUNTER — Emergency Department (HOSPITAL_COMMUNITY): Payer: BC Managed Care – PPO

## 2020-12-13 ENCOUNTER — Other Ambulatory Visit: Payer: Self-pay

## 2020-12-13 ENCOUNTER — Emergency Department (HOSPITAL_COMMUNITY)
Admission: EM | Admit: 2020-12-13 | Discharge: 2020-12-13 | Disposition: A | Discharge status: Home / Self Care | Payer: BC Managed Care – PPO | Attending: Emergency Medicine | Admitting: Emergency Medicine

## 2020-12-13 ENCOUNTER — Encounter (HOSPITAL_COMMUNITY): Payer: Self-pay | Admitting: Emergency Medicine

## 2020-12-13 DIAGNOSIS — Z87891 Personal history of nicotine dependence: Secondary | ICD-10-CM | POA: Insufficient documentation

## 2020-12-13 DIAGNOSIS — Z7982 Long term (current) use of aspirin: Secondary | ICD-10-CM | POA: Insufficient documentation

## 2020-12-13 DIAGNOSIS — R61 Generalized hyperhidrosis: Secondary | ICD-10-CM | POA: Insufficient documentation

## 2020-12-13 DIAGNOSIS — Z79899 Other long term (current) drug therapy: Secondary | ICD-10-CM | POA: Insufficient documentation

## 2020-12-13 DIAGNOSIS — I10 Essential (primary) hypertension: Secondary | ICD-10-CM | POA: Diagnosis not present

## 2020-12-13 DIAGNOSIS — R42 Dizziness and giddiness: Secondary | ICD-10-CM | POA: Diagnosis not present

## 2020-12-13 DIAGNOSIS — R072 Precordial pain: Secondary | ICD-10-CM | POA: Diagnosis present

## 2020-12-13 LAB — CBC WITH DIFFERENTIAL/PLATELET
Abs Immature Granulocytes: 0.02 10*3/uL (ref 0.00–0.07)
Basophils Absolute: 0 10*3/uL (ref 0.0–0.1)
Basophils Relative: 1 %
Eosinophils Absolute: 0.1 10*3/uL (ref 0.0–0.5)
Eosinophils Relative: 2 %
HCT: 42.7 % (ref 39.0–52.0)
Hemoglobin: 15 g/dL (ref 13.0–17.0)
Immature Granulocytes: 0 %
Lymphocytes Relative: 29 %
Lymphs Abs: 1.5 10*3/uL (ref 0.7–4.0)
MCH: 34.1 pg — ABNORMAL HIGH (ref 26.0–34.0)
MCHC: 35.1 g/dL (ref 30.0–36.0)
MCV: 97 fL (ref 80.0–100.0)
Monocytes Absolute: 0.4 10*3/uL (ref 0.1–1.0)
Monocytes Relative: 9 %
Neutro Abs: 3 10*3/uL (ref 1.7–7.7)
Neutrophils Relative %: 59 %
Platelets: 250 10*3/uL (ref 150–400)
RBC: 4.4 MIL/uL (ref 4.22–5.81)
RDW: 13.1 % (ref 11.5–15.5)
WBC: 5.1 10*3/uL (ref 4.0–10.5)
nRBC: 0 % (ref 0.0–0.2)

## 2020-12-13 LAB — BASIC METABOLIC PANEL
Anion gap: 9 (ref 5–15)
BUN: 11 mg/dL (ref 6–20)
CO2: 27 mmol/L (ref 22–32)
Calcium: 8.9 mg/dL (ref 8.9–10.3)
Chloride: 96 mmol/L — ABNORMAL LOW (ref 98–111)
Creatinine, Ser: 1.1 mg/dL (ref 0.61–1.24)
GFR, Estimated: >60 mL/min (ref >60)
Glucose, Bld: 150 mg/dL — ABNORMAL HIGH (ref 70–99)
Potassium: 3.6 mmol/L (ref 3.5–5.1)
Sodium: 132 mmol/L — ABNORMAL LOW (ref 135–145)

## 2020-12-13 LAB — TROPONIN I (HIGH SENSITIVITY)
Troponin I (High Sensitivity): 11 ng/L (ref <18)
Troponin I (High Sensitivity): 10 ng/L (ref <18)

## 2020-12-13 NOTE — ED Triage Notes (Signed)
Patient reports left upper chest pain with mild SOB , lightheaded and diaphoresis this evening . No cough or fever .

## 2020-12-13 NOTE — ED Provider Notes (Signed)
Emergency Medicine Provider Triage Evaluation Note  Todd Owens , a 55 y.o. male  was evaluated in triage.  Pt complains of chest pain.  Occurred earlier today while sitting in fast food line.  He felt "sweaty" and felt like he was going to pass out.  Pain self resolved.  Did not radiate to left arm, left back or jaw.  He denies any exertional or pleuritic chest pain at baseline.  No lateral leg swelling, redness or warmth.  Current pain 0/10.  Review of Systems  Positive: Chest pain Negative: Syncope, lower extremity edema  Physical Exam  BP (!) 146/81 (BP Location: Left Arm)   Pulse 72   Temp 99.7 F (37.6 C) (Oral)   Resp 20   SpO2 99%  Gen:   Awake, no distress   Resp:  Normal effort  MSK:   Moves extremities without difficulty  Other:    Medical Decision Making  Medically screening exam initiated at 7:38 PM.  Appropriate orders placed.  JAKARI SADA was informed that the remainder of the evaluation will be completed by another provider, this initial triage assessment does not replace that evaluation, and the importance of remaining in the ED until their evaluation is complete.  CP  Labs and imaging ordered>>> stable for waiting room   Lindley Hiney A, PA-C 12/13/20 1940    Breck Coons, MD 12/13/20 2054

## 2020-12-13 NOTE — ED Provider Notes (Signed)
[facility] EMERGENCY DEPARTMENT Provider Note   CSN: [id number] Arrival date & time: 12/13/20  1923     History Chief Complaint  Patient presents with  . Chest Pain    Todd Owens is a 55 y.o. male.  HPI Presents with chest pain.  Occurred and lasted a couple minutes while sitting in line at a fast food restaurant drive-through.  Sharp.  Midsternal.  Nonradiating.  Afterwards he became clammy and lightheaded and felt like he was going to pass out but he did not pass out.  He has a history of syncope in the past he said he had a normal work-up.  He had a stress test in 2018 that was normal.  No trouble breathing at any point.  No history of blood clots.  No focal leg swelling.  No hemoptysis.  No recent surgeries or immobilizations.  He sees his cardiologist next week for routine follow-up.  HPI: A 55 year old patient with a history of hypertension, hypercholesterolemia and obesity presents for evaluation of chest pain. Initial onset of pain was approximately 3-6 hours ago. The patient's chest pain is sharp and is not worse with exertion. The patient reports some diaphoresis. The patient's chest pain is middle- or left-sided, is not well-localized, is not described as heaviness/pressure/tightness and does not radiate to the arms/jaw/neck. The patient does not complain of nausea. The patient has no history of stroke, has no history of peripheral artery disease, has not smoked in the past 90 days, denies any history of treated diabetes and has no relevant family history of coronary artery disease (first degree relative at less than age 44).   Past Medical History:  Diagnosis Date  . Acute sinusitis, unspecified   . Dermatophytosis of nail   . Esophageal reflux   . Essential hypertension, benign   . Impotence of organic origin   . Morbid obesity ([place])   . Obstructive sleep apnea (adult) (pediatric)   . Other and unspecified hyperlipidemia   . Other malaise and fatigue    . Other testicular hypofunction   . Pure hypercholesterolemia   . Rosacea   . Routine general medical examination at a health care facility   . Sleep apnea     Patient Active Problem List   Diagnosis Date Noted  . Neck pain, acute 12/06/2020  . Ear fullness, bilateral 06/28/2020  . Left knee pain 01/26/2019  . Right knee pain 10/11/2018  . Strain of hip flexor 09/14/2017  . De Quervain's tenosynovitis, left 12/31/2016  . Vasovagal syncope 10/21/2016  . Family history of premature CAD 10/09/2014  . Prediabetes 05/26/2013  . Metabolic syndrome 52/77/8242  . 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 Testosterone deficiency 09/17/2009  . 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 ONYCHOMYCOSIS, BILATERAL 09/14/2008  . GERD 09/14/2008  . Obstructive sleep apnea 11/25/2007  . Essential hypertension, benign 11/14/2007  . BMI 45.0-49.9, adult ([place]) 10/07/2007  . ACNE, ROSACEA 10/07/2007  . Hyperlipidemia 10/03/2007    Past Surgical History:  Procedure Laterality Date  . BACK SURGERY  2003  . cardiolyte  2001   neg  . KNEE SURGERY  2005  . PFT  2007   WNL  . WRIST SURGERY Right        Family History  Problem Relation Age of Onset  . Hypertension Mother   . Heart disease Mother        pacemaker  . Hypertension Father   . Stroke Father   . Heart disease Father 17  .  Heart attack Father   . Heart disease Sister   . Heart attack Sister 2  . Heart failure Sister   . Colon cancer Neg Hx   . Esophageal cancer Neg Hx   . Liver cancer Neg Hx   . Pancreatic cancer Neg Hx   . Stomach cancer Neg Hx   . Rectal cancer Neg Hx     Social History   Tobacco Use  . Smoking status: Former Smoker    Packs/day: 1.00    Years: 15.00    Pack years: 15.00    Quit date: 1995    Years since quitting: 27.4  . Smokeless tobacco: Never Used  Vaping Use  . Vaping Use: Never used  Substance Use Topics  . Alcohol use: No  . Drug use: No    Home Medications Prior to Admission  medications   Medication Sig Start Date End Date Taking? Authorizing Provider  ASPIRIN LOW DOSE 81 MG EC tablet TAKE 1 TABLET BY MOUTH EVERY DAY Patient taking differently: Take 81 mg by mouth daily. 12/20/19  Yes Bedsole, Amy E, MD  cyclobenzaprine (FLEXERIL) 10 MG tablet Take 1 tablet (10 mg total) by mouth at bedtime as needed for muscle spasms. 11/14/20  Yes Bedsole, Amy E, MD  losartan (COZAAR) 100 MG tablet TAKE 1 TABLET BY MOUTH EVERY DAY Patient taking differently: Take 100 mg by mouth daily. 07/18/20  Yes Bedsole, Amy E, MD  omeprazole (PRILOSEC) 40 MG capsule TAKE 1 CAPSULE BY MOUTH EVERY DAY Patient taking differently: Take 40 mg by mouth daily. 12/20/19  Yes Bedsole, Amy E, MD  PRESCRIPTION MEDICATION Inhale into the lungs at bedtime. CPAP   Yes [provider]  simvastatin (ZOCOR) 40 MG tablet TAKE 1 TABLET BY MOUTH EVERY DAY Patient taking differently: Take 40 mg by mouth daily. 12/20/19  Yes Bedsole, Amy E, MD  triamterene-hydrochlorothiazide (IPJASNK-53) 37.5-25 MG tablet TAKE 1 TABLET BY MOUTH EVERY DAY Patient taking differently: Take 1 tablet by mouth daily. 09/27/20  Yes Bedsole, Amy E, MD  diclofenac (VOLTAREN) 75 MG EC tablet TAKE 1 TABLET BY MOUTH TWICE A DAY Patient not taking: No sig reported 11/27/20   Bedsole, Amy E, MD  mupirocin ointment (BACTROBAN) 2 % Apply 1 application topically 2 (two) times daily. Patient not taking: No sig reported 10/31/20   Lesleigh Noe, MD  predniSONE (DELTASONE) 20 MG tablet 2 tabs po daily x 5 days then 1 tab po daily x 5 days Patient not taking: No sig reported 12/06/20   Jinny Sanders, MD    Allergies    Patient has no known allergies.  Review of Systems   Review of Systems  Constitutional: Negative for chills and fever.  HENT: Negative for ear pain and sore throat.   Eyes: Negative for pain and visual disturbance.  Respiratory: Negative for cough and shortness of breath.   Cardiovascular: Negative for chest pain and  palpitations.  Gastrointestinal: Negative for abdominal pain and vomiting.  Genitourinary: Negative for dysuria and hematuria.  Musculoskeletal: Negative for arthralgias and back pain.  Skin: Negative for color change and rash.  Neurological: Negative for seizures and syncope.  All other systems reviewed and are negative.   Physical Exam Updated Vital Signs BP (!) 107/54   Pulse 68   Temp 99.6 F (37.6 C)   Resp 15   Ht 5\' 9"  (1.753 m)   Wt (!) 170 kg   SpO2 95%   BMI 55.35 kg/m   Physical Exam  Vitals and nursing note reviewed.  Constitutional:      Appearance: He is well-developed.  HENT:     Head: Normocephalic and atraumatic.  Eyes:     Extraocular Movements: Extraocular movements intact.     Conjunctiva/sclera: Conjunctivae normal.  Cardiovascular:     Rate and Rhythm: Normal rate and regular rhythm.     Heart sounds: No murmur heard.   Pulmonary:     Effort: Pulmonary effort is normal. No respiratory distress.     Breath sounds: Normal breath sounds.  Abdominal:     Palpations: Abdomen is soft.     Tenderness: There is no abdominal tenderness.  Musculoskeletal:     Cervical back: Normal range of motion and neck supple.     Right lower leg: No edema.     Left lower leg: No edema.  Skin:    General: Skin is warm and dry.  Neurological:     General: No focal deficit present.     Mental Status: He is alert.     Motor: No weakness.  Psychiatric:        Behavior: Behavior normal.        Thought Content: Thought content normal.     ED Results / Procedures / Treatments   Labs (all labs ordered are listed, but only abnormal results are displayed) Labs Reviewed  CBC WITH DIFFERENTIAL/PLATELET - Abnormal; Notable for the following components:      Result Value   MCH 34.1 (*)    All other components within normal limits  BASIC METABOLIC PANEL - Abnormal; Notable for the following components:   Sodium 132 (*)    Chloride 96 (*)    Glucose, Bld 150 (*)     All other components within normal limits  TROPONIN I (HIGH SENSITIVITY)  TROPONIN I (HIGH SENSITIVITY)    EKG EKG Interpretation  Date/Time:  Friday Dec 13 2020 19:35:14 EDT Ventricular Rate:  70 PR Interval:  136 QRS Duration: 98 QT Interval:  364 QTC Calculation: 393 R Axis:   -12 Text Interpretation: Normal sinus rhythm Normal ECG Confirmed by Thayer Jew [id number]) on 12/13/2020 11:01:34 PM   Radiology DG Chest 2 View  Result Date: 12/13/2020 CLINICAL DATA:  Shortness of breath, chest pain EXAM: CHEST - 2 VIEW COMPARISON:  Radiograph 12/22/2016 FINDINGS: Streaky basilar opacities, right greater than left, may reflect atelectasis or developing airspace disease in the appropriate clinical setting. No pneumothorax or visible effusion. The cardiomediastinal contours are unremarkable. No acute osseous or soft tissue abnormality. Degenerative changes are present in the imaged spine and shoulders. IMPRESSION: Streaky basilar opacities with more focal opacity in right base. Could reflect atelectasis and/or developing airspace disease in the appropriate clinical setting. Electronically Signed   By: Lovena Le M.D.   On: 12/13/2020 20:23    Procedures Procedures   Medications Ordered in ED Medications - No data to display  ED Course  I have reviewed the triage vital signs and the nursing notes.  Pertinent labs & imaging results that were available during my care of the patient were reviewed by me and considered in my medical decision making (see chart for details).    MDM Rules/Calculators/A&P HEAR Score: 4                         Patient presents with chest pain.  Symptoms are concerning given his reported being clammy.  He has had negative work-up in the past.  Pain has been  resolved for a couple of hours although his most recent troponin was within 2 hours of his pain.  No shortness of breath to suggest pulmonary embolus no pulmonary embolus risk factors.  I reviewed his  ECG and note T wave inversions isolated in V1, aVR, aVL similar to previous.  Otherwise sinus rhythm, no conduction delays. I reviewed his chest x-ray did not show any focal consolidations, mediastinal widening, or interstitial edema. Laboratory studies benign.  Troponin negative x2.  Hear score 4.  Given that the patient is symptom-free he is stable and appropriate for discharge with close outpatient follow-up and he agrees to see his cardiologist soon as possible and already has follow-up scheduled within a week.  Return precautions discussed   Final Clinical Impression(s) / ED Diagnoses Final diagnoses:  None    Rx / DC Orders ED Discharge Orders    None       Aris Lot, MD 12/13/20 [id number]    Merryl Hacker, MD 12/14/20 [phone]

## 2020-12-13 NOTE — Discharge Instructions (Addendum)
Follow-up with your cardiologist next week regarding your symptoms today.  Please come back for severe chest pain, passing out, or any other emergencies

## 2020-12-14 DIAGNOSIS — S46819A Strain of other muscles, fascia and tendons at shoulder and upper arm level, unspecified arm, initial encounter: Secondary | ICD-10-CM | POA: Insufficient documentation

## 2020-12-14 NOTE — Assessment & Plan Note (Signed)
Can use heat on neck.  Start antinflamatory.. diclofenac twice daily.  Start muscle relaxant at night.  Massage on neck.  Can start stretching of upper back but not repetitive movement of neck.  No heavy lifting > 10 lbs.  If not improving in 2 weeks.. follow up for re-evaluation and possible X-ray.

## 2020-12-20 ENCOUNTER — Ambulatory Visit (INDEPENDENT_AMBULATORY_CARE_PROVIDER_SITE_OTHER): Payer: BC Managed Care – PPO | Admitting: Medical

## 2020-12-20 ENCOUNTER — Other Ambulatory Visit: Payer: Self-pay

## 2020-12-20 ENCOUNTER — Other Ambulatory Visit
Admission: RE | Admit: 2020-12-20 | Discharge: 2020-12-20 | Disposition: A | Discharge status: Home / Self Care | Payer: BC Managed Care – PPO | Source: Ambulatory Visit | Attending: Medical | Admitting: Medical

## 2020-12-20 ENCOUNTER — Encounter: Payer: Self-pay | Admitting: Medical

## 2020-12-20 VITALS — BP 144/80 | HR 78 | Ht 69.0 in | Wt 332.0 lb

## 2020-12-20 DIAGNOSIS — I10 Essential (primary) hypertension: Secondary | ICD-10-CM | POA: Diagnosis present

## 2020-12-20 DIAGNOSIS — R072 Precordial pain: Secondary | ICD-10-CM

## 2020-12-20 DIAGNOSIS — R0789 Other chest pain: Secondary | ICD-10-CM | POA: Diagnosis present

## 2020-12-20 DIAGNOSIS — E782 Mixed hyperlipidemia: Secondary | ICD-10-CM | POA: Insufficient documentation

## 2020-12-20 LAB — LIPID PANEL
Cholesterol: 149 mg/dL (ref 0–200)
HDL: 29 mg/dL — ABNORMAL LOW (ref >40)
LDL Cholesterol: 89 mg/dL (ref 0–99)
Total CHOL/HDL Ratio: 5.1 RATIO
Triglycerides: 157 mg/dL — ABNORMAL HIGH (ref <150)
VLDL: 31 mg/dL (ref 0–40)

## 2020-12-20 LAB — BASIC METABOLIC PANEL
Anion gap: 10 (ref 5–15)
BUN: 16 mg/dL (ref 6–20)
CO2: 27 mmol/L (ref 22–32)
Calcium: 9.1 mg/dL (ref 8.9–10.3)
Chloride: 99 mmol/L (ref 98–111)
Creatinine, Ser: 1.17 mg/dL (ref 0.61–1.24)
GFR, Estimated: >60 mL/min (ref >60)
Glucose, Bld: 139 mg/dL — ABNORMAL HIGH (ref 70–99)
Potassium: 3.6 mmol/L (ref 3.5–5.1)
Sodium: 136 mmol/L (ref 135–145)

## 2020-12-20 MED ORDER — METOPROLOL TARTRATE 100 MG PO TABS: 100.0000 mg | ORAL_TABLET | Freq: Once | ORAL | 0 refills | Status: DC

## 2020-12-20 NOTE — Patient Instructions (Signed)
Medication Instructions:  Your physician recommends that you continue on your current medications as directed. Please refer to the Current Medication list given to you today. *If you need a refill on your cardiac medications before your next appointment, please call your pharmacy*   Lab Work:  1.   Your physician recommends that you return for lab work in: anytime prior to your        CCTA appointment.   2.  Your physician recommends that you return for a FASTING lipid profile: Just prior         to your follow up appointment in 5 weeks.  - You will need to be fasting. Please do not have anything to eat or drink after midnight the morning you have the lab work. You may only have water or black coffee with no cream or sugar. - Please go to the [facility]. You will check in at the front desk to the right as you walk into the atrium. Valet Parking is offered if needed. - No appointment needed. You may go any day between 7 am and 6 pm.     Testing/Procedures:   1.  Your physician has requested that you have cardiac CT. Cardiac computed tomography (CT) is a painless test that uses an x-ray machine to take clear, detailed pictures of your heart.  Your cardiac CT will be scheduled at:  [facility] [address] [place], [place] [address] [phone]  If scheduled at [facility], please arrive at the [facility] main entrance (entrance A) of [facility] 30 minutes prior to test start time. Proceed to the [facility] Radiology Department (first floor) to check-in and test prep.    Please follow these instructions carefully (unless otherwise directed):  Hold all erectile dysfunction medications at least 3 days (72 hrs) prior to test.    On the Night Before the Test: . Be sure to Drink plenty of water. . Do not consume any caffeinated/decaffeinated beverages or chocolate 12 hours prior to your test. . Do not take any antihistamines 12 hours  prior to your test.    On the Day of the Test: . Drink plenty of water until 1 hour prior to the test. . Do not eat any food 4 hours prior to the test. . You may take your regular medications prior to the test.  . Take metoprolol (Lopressor) two hours prior to test. . HOLD Furosemide/Hydrochlorothiazide morning of the test.    Once we have confirmed authorization from your insurance company, we will call you to set up a date and time for your test. Based on how quickly your insurance processes prior authorizations requests, please allow up to 4 weeks to be contacted for scheduling your Cardiac CT appointment. Be advised that routine Cardiac CT appointments could be scheduled as many as 8 weeks after your provider has ordered it.  For non-scheduling related questions, please contact the cardiac imaging nurse navigator should you have any questions/concerns: Marchia Bond, Cardiac Imaging Nurse Navigator Gordy Clement, Cardiac Imaging Nurse Navigator [facility] Direct Office Dial: [phone]   For scheduling needs, including cancellations and rescheduling, please call [place], [phone].    Follow-Up: At [facility], you and your health needs are our priority.  As part of our continuing mission to provide you with exceptional heart care, we have created designated Provider Care Teams.  These Care Teams include your primary Cardiologist (physician) and Advanced Practice Providers (APPs -  Physician Assistants and Nurse Practitioners) who all work together to provide you with the care you need, when you need it.  We recommend signing up for the patient portal called "MyChart".  Sign up information is provided on this After Visit Summary.  MyChart is used to connect with patients for Virtual Visits (Telemedicine).  Patients are able to view lab/test results, encounter notes, upcoming appointments, etc.  Non-urgent messages can be sent to your provider as  well.   To learn more about what you can do with MyChart, go to [email or web address].    Your next appointment:   5 week(s)  The format for your next appointment:   In Person  Provider:   You may see Dr. Saunders Revel or one of the following Advanced Practice Providers on your designated Care Team:    Murray Hodgkins, NP  Christell Faith, PA-C  Marrianne Mood, PA-C  Cadence Kathlen Mody, [place]  Laurann Montana, NP    Other Instructions  Check your BP daily and keep track of it for one week, then send a copy of your recordings through MyChart, or call us with the numbers so we can review.   How to Take Your Blood Pressure Blood pressure measures how strongly your blood is pressing against the walls of your arteries. Arteries are blood vessels that carry blood from your heart throughout your body. You can take your blood pressure at home with a machine. You may need to check your blood pressure at home:  To check if you have high blood pressure (hypertension).  To check your blood pressure over time.  To make sure your blood pressure medicine is working. Supplies needed:  Blood pressure machine, or monitor.  Dining room chair to sit in.  Table or desk.  Small notebook.  Pencil or pen. How to prepare Avoid these things for 30 minutes before checking your blood pressure:  Having drinks with caffeine in them, such as coffee or tea.  Drinking alcohol.  Eating.  Smoking.  Exercising. Do these things five minutes before checking your blood pressure:  Go to the bathroom and pee (urinate).  Sit in a dining chair. Do not sit in a soft couch or an armchair.  Be quiet. Do not talk. How to take your blood pressure Follow the instructions that came with your machine. If you have a digital blood pressure monitor, these may be the instructions: 1. Sit up straight. 2. Place your feet on the floor. Do not cross your ankles or legs. 3. Rest your left arm at the level of your heart.  You may rest it on a table, desk, or chair. 4. Pull up your shirt sleeve. 5. Wrap the blood pressure cuff around the upper part of your left arm. The cuff should be 1 inch (2.5 cm) above your elbow. It is best to wrap the cuff around bare skin. 6. Fit the cuff snugly around your arm. You should be able to place only one finger between the cuff and your arm. 7. Place the cord so that it rests in the bend of your elbow. 8. Press the power button. 9. Sit quietly while the cuff fills with air and loses air. 10. Write down the numbers on the screen. 11. Wait 2-3 minutes and then repeat steps 1-10.   What do the numbers mean? Two numbers make up your blood pressure. The first number is called systolic pressure. The second is called diastolic pressure. An example of a blood pressure reading is "120 over 80" (  or 120/80). If you are an adult and do not have a medical condition, use this guide to find out if your blood pressure is normal: Normal  First number: below 120.  Second number: below 80. Elevated  First number: 120-129.  Second number: below 80. Hypertension stage 1  First number: 130-139.  Second number: 80-89. Hypertension stage 2  First number: 140 or above.  Second number: 57 or above. Your blood pressure is above normal even if only the top or bottom number is above normal. Follow these instructions at home:  Check your blood pressure as often as your doctor tells you to.  Check your blood pressure at the same time every day.  Take your monitor to your next doctor's appointment. Your doctor will: ? Make sure you are using it correctly. ? Make sure it is working right.  Make sure you understand what your blood pressure numbers should be.  Tell your doctor if your medicine is causing side effects.  Keep all follow-up visits as told by your doctor. This is important. General tips:  You will need a blood pressure machine, or monitor. Your doctor can suggest a  monitor. You can buy one at a drugstore or online. When choosing one: ? Choose one with an arm cuff. ? Choose one that wraps around your upper arm. Only one finger should fit between your arm and the cuff. ? Do not choose one that measures your blood pressure from your wrist or finger. Where to find more information American Heart Association: www.heart.org Contact a doctor if:  Your blood pressure keeps being high. Get help right away if:  Your first blood pressure number is higher than 180.  Your second blood pressure number is higher than 120. Summary  Check your blood pressure at the same time every day.  Avoid caffeine, alcohol, smoking, and exercise for 30 minutes before checking your blood pressure.  Make sure you understand what your blood pressure numbers should be. This information is not intended to replace advice given to you by your health care provider. Make sure you discuss any questions you have with your health care provider. Document Revised: 06/30/2019 Document Reviewed: 06/30/2019 Elsevier Patient Education  2021 [place].    Blood Pressure Record Sheet To take your blood pressure, you will need a blood pressure machine. You can buy a blood pressure machine (blood pressure monitor) at your clinic, drug store, or online. When choosing one, consider:  An automatic monitor that has an arm cuff.  A cuff that wraps snugly around your upper arm. You should be able to fit only one finger between your arm and the cuff.  A device that stores blood pressure reading results.  Do not choose a monitor that measures your blood pressure from your wrist or finger. Follow your health care provider's instructions for how to take your blood pressure. To use this form:  Get one reading in the morning (a.m.) before you take any medicines.  Get one reading in the evening (p.m.) before supper.  Take at least 2 readings with each blood pressure check. This makes sure the  results are correct. Wait 1-2 minutes between measurements.  Write down the results in the spaces on this form.  Repeat this once a week, or as told by your health care provider.  Make a follow-up appointment with your health care provider to discuss the results. Blood pressure log Date: _______________________  a.m. _____________________(1st reading) _____________________(2nd reading)  p.m. _____________________(1st reading) _____________________(2nd reading)  Date: _______________________  a.m. _____________________(1st reading) _____________________(2nd reading)  p.m. _____________________(1st reading) _____________________(2nd reading) Date: _______________________  a.m. _____________________(1st reading) _____________________(2nd reading)  p.m. _____________________(1st reading) _____________________(2nd reading) Date: _______________________  a.m. _____________________(1st reading) _____________________(2nd reading)  p.m. _____________________(1st reading) _____________________(2nd reading) Date: _______________________  a.m. _____________________(1st reading) _____________________(2nd reading)  p.m. _____________________(1st reading) _____________________(2nd reading) This information is not intended to replace advice given to you by your health care provider. Make sure you discuss any questions you have with your health care provider. Document Revised: 10/25/2019 Document Reviewed: 10/25/2019 Elsevier Patient Education  2021 [employer].
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
## 2020-12-26 ENCOUNTER — Other Ambulatory Visit: Payer: Self-pay | Admitting: Family Medicine

## 2020-12-26 ENCOUNTER — Telehealth: Payer: Self-pay | Admitting: Family Medicine

## 2020-12-26 NOTE — Telephone Encounter (Signed)
Please schedule CPE with fasting labs prior for Dr. Bedsole. 

## 2020-12-26 NOTE — Telephone Encounter (Signed)
Spoke with patient scheduled CPE with fasting lab

## 2020-12-29 ENCOUNTER — Other Ambulatory Visit: Payer: Self-pay | Admitting: Family Medicine

## 2021-01-08 ENCOUNTER — Telehealth (HOSPITAL_COMMUNITY): Payer: Self-pay | Admitting: *Deleted

## 2021-01-08 NOTE — Telephone Encounter (Signed)
Reaching out to patient to offer assistance regarding upcoming cardiac imaging study; pt verbalizes understanding of appt date/time, parking situation and where to check in, pre-test NPO status and medications ordered, and verified current allergies; name and call back number provided for further questions should they arise  Bronx Brogden RN Navigator Cardiac Imaging [facility] [phone] office [phone] cell  Pt to take 100mg metoprolol tartrate 2 hours prior to cardiac CT scan. 

## 2021-01-09 ENCOUNTER — Other Ambulatory Visit: Payer: Self-pay | Admitting: Cardiology

## 2021-01-09 ENCOUNTER — Telehealth: Payer: Self-pay | Admitting: Internal Medicine

## 2021-01-09 ENCOUNTER — Ambulatory Visit (HOSPITAL_COMMUNITY)
Admission: RE | Admit: 2021-01-09 | Discharge: 2021-01-09 | Disposition: A | Discharge status: Home / Self Care | Payer: BC Managed Care – PPO | Source: Ambulatory Visit | Attending: Cardiology | Admitting: Cardiology

## 2021-01-09 ENCOUNTER — Ambulatory Visit (HOSPITAL_COMMUNITY)
Admission: RE | Admit: 2021-01-09 | Discharge: 2021-01-09 | Disposition: A | Discharge status: Home / Self Care | Payer: BC Managed Care – PPO | Source: Ambulatory Visit | Attending: Medical | Admitting: Medical

## 2021-01-09 ENCOUNTER — Other Ambulatory Visit: Payer: Self-pay

## 2021-01-09 DIAGNOSIS — R072 Precordial pain: Secondary | ICD-10-CM

## 2021-01-09 DIAGNOSIS — R911 Solitary pulmonary nodule: Secondary | ICD-10-CM

## 2021-01-09 DIAGNOSIS — I2510 Atherosclerotic heart disease of native coronary artery without angina pectoris: Secondary | ICD-10-CM

## 2021-01-09 DIAGNOSIS — R9389 Abnormal findings on diagnostic imaging of other specified body structures: Secondary | ICD-10-CM

## 2021-01-09 DIAGNOSIS — R931 Abnormal findings on diagnostic imaging of heart and coronary circulation: Secondary | ICD-10-CM

## 2021-01-09 MED ORDER — IOHEXOL 350 MG/ML SOLN
100.0000 mL | Freq: Once | INTRAVENOUS | Status: AC | PRN
  Administered 2021-01-09: 100 mL via INTRAVENOUS

## 2021-01-09 MED ORDER — NITROGLYCERIN 0.4 MG SL SUBL
SUBLINGUAL_TABLET | SUBLINGUAL | Status: AC
  Filled 2021-01-09: qty 2

## 2021-01-09 MED ORDER — NITROGLYCERIN 0.4 MG SL SUBL
0.8000 mg | SUBLINGUAL_TABLET | Freq: Once | SUBLINGUAL | Status: AC
  Administered 2021-01-09: 0.8 mg via SUBLINGUAL

## 2021-01-09 NOTE — Telephone Encounter (Signed)
Radiology calling with a report  Transferred to [facility]

## 2021-01-09 NOTE — Telephone Encounter (Signed)
Late entry, I received at 12:50 from [facility] with radiology.  He informed me of the results below.  I will forward to Cadence Brookland, [place]  (Ordering provider)  Lungs/Pleura: No pleural fluid. Right-sided pleural thickening and calcification. Right upper lobe scarring. Suspicion of an 8 mm central left upper lobe pulmonary nodule on 01/11, incompletely imaged.  IMPRESSION: 1.  No acute findings in the imaged extracardiac chest. 2. Right-sided pleural thickening and calcification, suggesting prior empyema or hemothorax. 3. Possible left upper lobe 8 mm pulmonary nodule, incompletely imaged. Consider further evaluation with dedicated diagnostic chest CT.

## 2021-01-11 ENCOUNTER — Other Ambulatory Visit: Payer: Self-pay | Admitting: Family Medicine

## 2021-01-23 NOTE — Progress Notes (Signed)
Cardiology Office Note:    Date:  01/24/2021   ID:  Todd Owens, DOB [birth year], MRN [id number]  PCP:  Jinny Sanders, MD  [facility] Cardiologist:  None  [facility] Electrophysiologist:  None   Referring MD: Jinny Sanders, MD   Chief Complaint: follow-up  History of Present Illness:    Todd Owens is a 55 y.o. male with a hx of syncope, HTN, HLD, OSA on CPAP, morbid obesity who presents for cardiac CT follow-up.   Patient seen 2018 by Dr. Saunders Revel for chest pain and syncope. Echo showed preserved LVEF without valvular abnormalities. He was lost to follow-up.   He was seen in the ER 12/13/20 for chest pain. Troponin was negative and EKG was unremarkable. He was discharged home.   Seen 12/20/20 in the cardiology office and a cardiac CT was ordered. Imaging showed coronary calcium score 1155 (99th percentile for age and sex matched), severe 3V disease and it was sent for FFR. FFR 0.72  distal LAD showed functional significance and dLCX 0.77 suggesting functional significance.  Today, the patient reports he has been feeling well. No chest pain or shortness of breath. No lower leg edema, orthopnea, pnd. Cardiac CT discussed in detail. When discussing cardiac catheterization, the patient had syncopal episode. Prior to passing out he felt warm and flushed, and slowly lost consciousness. No dizziness or lightheadedness. NO chest pain or shortness of breath. Patient was immediatly laid back and EKG/vitals obtained. BP 122/92, 52bpm, O2 95% No h/o diabetes, did not eat this morning. BG 149 Uses CPAP nightly EKG showed SB, 49bpm, no ST/T wave changes DOD, Dr. Garen Lah was called in the room and was present for conversation. Suspect vasovagal syncope. Patient has been experiences syncopal episodes for many years, more common with blood drawn. Occurs when sitting. He was given orange juice and crackers. Patient felt back to his normal self after a couple minutes.  Plan for: Real time Heart  monitor BMET Mag CBC TSH  Past Medical History:  Diagnosis Date   Acute sinusitis, unspecified    Dermatophytosis of nail    Esophageal reflux    Essential hypertension, benign    Impotence of organic origin    Morbid obesity ([place])    Obstructive sleep apnea (adult) (pediatric)    Other and unspecified hyperlipidemia    Other malaise and fatigue    Other testicular hypofunction    Pure hypercholesterolemia    Rosacea    Routine general medical examination at a health care facility    Sleep apnea     Past Surgical History:  Procedure Laterality Date   BACK SURGERY  2003   cardiolyte  2001   neg   KNEE SURGERY  2005   PFT  2007   WNL   WRIST SURGERY Right     Current Medications: Current Meds  Medication Sig   ASPIRIN LOW DOSE 81 MG EC tablet TAKE 1 TABLET BY MOUTH EVERY DAY   losartan (COZAAR) 100 MG tablet TAKE 1 TABLET BY MOUTH EVERY DAY   nitroGLYCERIN (NITROSTAT) 0.4 MG SL tablet Place 0.4 mg under the tongue every 5 (five) minutes as needed for chest pain.   omeprazole (PRILOSEC) 40 MG capsule TAKE 1 CAPSULE BY MOUTH EVERY DAY   simvastatin (ZOCOR) 40 MG tablet TAKE 1 TABLET BY MOUTH EVERY DAY   triamterene-hydrochlorothiazide (MAXZIDE-25) 37.5-25 MG tablet TAKE 1 TABLET BY MOUTH EVERY DAY     Allergies:   Patient has no known allergies.  Social History   Socioeconomic History   Marital status: Married    Spouse name: Not on file   Number of children: Not on file   Years of education: Not on file   Highest education level: Not on file  Occupational History   Not on file  Tobacco Use   Smoking status: Former    Packs/day: 1.00    Years: 15.00    Pack years: 15.00    Types: Cigarettes    Quit date: 44    Years since quitting: 27.5   Smokeless tobacco: Never  Vaping Use   Vaping Use: Never used  Substance and Sexual Activity   Alcohol use: No   Drug use: No   Sexual activity: Yes  Other Topics Concern   Not on file  Social History  Narrative   Not on file   Social Determinants of Health   Financial Resource Strain: Not on file  Food Insecurity: Not on file  Transportation Needs: Not on file  Physical Activity: Not on file  Stress: Not on file  Social Connections: Not on file     Family History: The patient's family history includes Heart attack in his father; Heart attack (age of onset: 29) in his sister; Heart disease in his mother and sister; Heart disease (age of onset: 79) in his father; Heart failure in his sister; Hypertension in his father and mother; Stroke in his father. There is no history of Colon cancer, Esophageal cancer, Liver cancer, Pancreatic cancer, Stomach cancer, or Rectal cancer.  ROS:   Please see the history of present illness.     All other systems reviewed and are negative.  EKGs/Labs/Other Studies Reviewed:    The following studies were reviewed today:  Echo 09/20/19 1. Very Poor echo images causing difficulty to evaluate wall motion  abnormalities. Overall EF appears normal.. Left ventricular ejection  fraction, by estimation, is 55 to 60%. The left ventricle has normal  function. Left ventricular endocardial border  not optimally defined to evaluate regional wall motion. There is unable to  assess left ventricular hypertrophy. Left ventricular diastolic function  could not be evaluated.   2. Right ventricular systolic function is normal. The right ventricular  size is normal. There is normal pulmonary artery systolic pressure.   3. The mitral valve is grossly normal. No evidence of mitral valve  regurgitation.   4. The aortic valve is grossly normal. Aortic valve regurgitation is not  visualized.   5. The inferior vena cava is normal in size with greater than 50%  respiratory variability, suggesting right atrial pressure of 3 mmHg.   Comparison(s): Previous study TDS, LV EF reported to be 60-65% without  RWMA.   Cardiac CT 01/09/21 Narrative & Impression  EXAM: FFRCT  ANALYSIS   FINDINGS: FFRct analysis was performed on the original cardiac CT angiogram dataset. Diagrammatic representation of the FFRct analysis is provided in a separate PDF document in PACS. This dictation was created using the PDF document and an interactive 3D model of the results. 3D model is not available in the EMR/PACS. Normal FFR range is >0.80.   1. Left Main: No significant stenosis   2. LAD: CTFFR 0.92 across lesion in proximal LAD, 0.82 in mid LAD, and 0.72 in distal LAD, and 0.84 in diagonal branch   3. LCX: CTFFR 0.89 across lesion in proximal LCX, 0.77 in distal LCX, and 0.80 in OM branch   4.   RCA: CTFFR 0.86 across lesion in mid RCA  IMPRESSION: 1. CTFFR 0.92 across lesion in proximal LAD and 0.82 across lesion in mid LAD, suggesting no functional significance of these lesions. CTFFR is 0.72 across lesion in distal LAD, suggesting functional significance   2. CTFFR 0.89 across lesion in proximal LCX, suggesting no functional significance. CTFFR 0.77 across lesion in distal LCX, suggesting functional significance   3. CTFFR 0.86 across lesion in mid RCA, suggesting no functional significance     Electronically Signed   By: Oswaldo Milian MD   On: 01/09/2021 23:20      EKG:  EKG is ordered today.  The ekg ordered today demonstrates SB 49bpm, nonspecific ST/T wave changes  Recent Labs: 12/13/2020: Hemoglobin 15.0; Platelets 250 12/20/2020: BUN 16; Creatinine, Ser 1.17; Potassium 3.6; Sodium 136  Recent Lipid Panel    Component Value Date/Time   CHOL 149 12/20/2020 1652   TRIG 157 (H) 12/20/2020 1652   HDL 29 (L) 12/20/2020 1652   CHOLHDL 5.1 12/20/2020 1652   VLDL 31 12/20/2020 1652   LDLCALC 89 12/20/2020 1652     Physical Exam:    VS:  BP 120/80 (BP Location: Left Arm, Patient Position: Sitting, Cuff Size: Large)   Pulse (!) 58   Ht 5\' 9"  (1.753 m)   Wt (!) 332 lb (150.6 kg)   SpO2 97%   BMI 49.03 kg/m     Wt Readings from  Last 3 Encounters:  01/24/21 (!) 332 lb (150.6 kg)  12/20/20 (!) 332 lb (150.6 kg)  12/13/20 (!) 374 lb 12.5 oz (170 kg)     GEN: Well nourished, well developed in no acute distress HEENT: Normal NECK: No JVD; No carotid bruits LYMPHATICS: No lymphadenopathy CARDIAC: RR, bradycardia, no murmurs, rubs, gallops RESPIRATORY:  Clear to auscultation without rales, wheezing or rhonchi  ABDOMEN: Soft, non-tender, non-distended MUSCULOSKELETAL:  No edema; No deformity  SKIN: Warm and dry NEUROLOGIC:  Alert and oriented x 3 PSYCHIATRIC:  Normal affect   ASSESSMENT:    1. Coronary artery disease involving native coronary artery of native heart with angina pectoris ([place])   2. Essential hypertension, benign   3. Hyperlipidemia, mixed   4. Morbid obesity ([place])   5. Syncope and collapse   6. OSA (obstructive sleep apnea)    PLAN:    In order of problems listed above:  Syncope Suspected Vasovagal  During discussion patient became hot and sweaty, and slowly lost consciousness. He was seated when this happened. He was immediately laid back. Loss of consciousness for less than a minute. Vitals, EKG, and BG obtained. BP 122/92, pulse 50bpm, O295. EKG showed SB with no significant ST/T wave changes. DOD was called in the room. Reported compliance with CPAP. No h/o diabetes, did not eat this morning. Syncopal episodes for a couple years now, generally occurs while sitting, exacerbated with blood draws. Echo 09/2020 showed normal LVEF. No prior heart monitor. Patient was sat up and given orange juice and crackers. Patient quickly felt back to baseline. Plan for real time heart monitor. Patient works out side and this may not stay, may need Linq device, will discuss with MD. Also recommended neurology visit. Labs today: BMET, Mag, TSH, CBC. Follow-up with Dr. Saunders Revel  Abnormal Coronary CT Chest pain Recent coronary CT showed disease in LAD, Lcx and RCA (complete reports above) and it was sent for FFR. FFR  showed 0.72 across lesion in distal LAD, suggesting functional significance, 0.77 across lesion in distal Lcx suggesting functional significance. Patient denies chest pain or shortness of breath  at this time, although had vague chest pain at prior visit. During discussion of cardiac catheterization, the patient had a syncopal episode as above. Will discuss plan with MD. Continue ASA, BB, statin. He has SL Nitro. Suspect he will eventually need LHC.  HTN BP wnl. Continue Losartan 100mg  daily and Lopressor 100mg  daily  Obesity Recommend weight loss  HLD LDL 89. CAD by coronary CT, goal LDL<70. Can change to high intensity statin at follow-up.   OSA on CPAP Reports compliance with CPAP  Disposition: Follow up in 2-3 weeks week(s) with Dr. Saunders Revel    Signed, Hollyn Stucky Ninfa Meeker, PA-C  01/24/2021 2:39 PM    Todd Owens

## 2021-01-24 ENCOUNTER — Ambulatory Visit (INDEPENDENT_AMBULATORY_CARE_PROVIDER_SITE_OTHER): Payer: BC Managed Care – PPO | Admitting: Medical

## 2021-01-24 ENCOUNTER — Encounter: Payer: Self-pay | Admitting: Medical

## 2021-01-24 ENCOUNTER — Ambulatory Visit (INDEPENDENT_AMBULATORY_CARE_PROVIDER_SITE_OTHER): Payer: BC Managed Care – PPO

## 2021-01-24 ENCOUNTER — Other Ambulatory Visit: Payer: Self-pay

## 2021-01-24 VITALS — BP 120/80 | HR 58 | Ht 69.0 in | Wt 332.0 lb

## 2021-01-24 DIAGNOSIS — I25119 Atherosclerotic heart disease of native coronary artery with unspecified angina pectoris: Secondary | ICD-10-CM | POA: Diagnosis not present

## 2021-01-24 DIAGNOSIS — I10 Essential (primary) hypertension: Secondary | ICD-10-CM

## 2021-01-24 DIAGNOSIS — R55 Syncope and collapse: Secondary | ICD-10-CM

## 2021-01-24 DIAGNOSIS — E782 Mixed hyperlipidemia: Secondary | ICD-10-CM | POA: Diagnosis not present

## 2021-01-24 DIAGNOSIS — G4733 Obstructive sleep apnea (adult) (pediatric): Secondary | ICD-10-CM

## 2021-01-24 NOTE — Patient Instructions (Signed)
Medication Instructions:  - Your physician recommends that you continue on your current medications as directed. Please refer to the Current Medication list given to you today.  *If you need a refill on your cardiac medications before your next appointment, please call your pharmacy*   Lab Work: - Your physician recommends that you have lab work today: BMP/ Magnesium/ TSH/ CBC  If you have labs (blood work) drawn today and your tests are completely normal, you will receive your results only by: MyChart Message (if you have MyChart) OR A paper copy in the mail If you have any lab test that is abnormal or we need to change your treatment, we will call you to review the results.   Testing/Procedures: - Your physician has recommended that you wear a Zio AT (heart) monitor x 14 days   This monitor is a medical device that records the heart's electrical activity. Doctors most often use these monitors to diagnose arrhythmias. Arrhythmias are problems with the speed or rhythm of the heartbeat. The monitor is a small device applied to your chest. You can wear one while you do your normal daily activities. While wearing this monitor if you have any symptoms to push the button and record what you felt. Once you have worn this monitor for the period of time provider prescribed (Usually 14 days), you will return the monitor device in the postage paid box. Once it is returned they will download the data collected and provide [place] with a report which the provider will then review and we will call you with those results. Important tips:  Avoid showering during the first 24 hours of wearing the monitor. Avoid excessive sweating to help maximize wear time. Do not submerge the device, no hot tubs, and no swimming pools. Keep any lotions or oils away from the patch. After 24 hours you may shower with the patch on. Take brief showers with your back facing the shower head.  Do not remove patch once it has been  placed because that will interrupt data and decrease adhesive wear time. Push the button when you have any symptoms and write down what you were feeling. Once you have completed wearing your monitor, remove and place into box which has postage paid and place in your outgoing mailbox.  If for some reason you have misplaced your box then call our office and we can provide another box and/or mail it off for you.     Follow-Up: At [facility], you and your health needs are our priority.  As part of our continuing mission to provide you with exceptional heart care, we have created designated Provider Care Teams.  These Care Teams include your primary Cardiologist (physician) and Advanced Practice Providers (APPs -  Physician Assistants and Nurse Practitioners) who all work together to provide you with the care you need, when you need it.  We recommend signing up for the patient portal called "MyChart".  Sign up information is provided on this After Visit Summary.  MyChart is used to connect with patients for Virtual Visits (Telemedicine).  Patients are able to view lab/test results, encounter notes, upcoming appointments, etc.  Non-urgent messages can be sent to your provider as well.   To learn more about what you can do with MyChart, go to [email or web address].    Your next appointment:  Wednesday 02/05/21 @ 8:20 am  The format for your next appointment:   In Person  Provider:   Nelva Bush, MD   Other Instructions N/a

## 2021-01-25 LAB — CBC
Hematocrit: 40.8 % (ref 37.5–51.0)
Hemoglobin: 14.1 g/dL (ref 13.0–17.7)
MCH: 34.1 pg — ABNORMAL HIGH (ref 26.6–33.0)
MCHC: 34.6 g/dL (ref 31.5–35.7)
MCV: 99 fL — ABNORMAL HIGH (ref 79–97)
Platelets: 290 10*3/uL (ref 150–450)
RBC: 4.13 x10E6/uL — ABNORMAL LOW (ref 4.14–5.80)
RDW: 14.2 % (ref 11.6–15.4)
WBC: 5.8 10*3/uL (ref 3.4–10.8)

## 2021-01-25 LAB — BASIC METABOLIC PANEL
BUN/Creatinine Ratio: 11 (ref 9–20)
BUN: 14 mg/dL (ref 6–24)
CO2: 24 mmol/L (ref 20–29)
Calcium: 9.7 mg/dL (ref 8.7–10.2)
Chloride: 98 mmol/L (ref 96–106)
Creatinine, Ser: 1.22 mg/dL (ref 0.76–1.27)
Glucose: 146 mg/dL — ABNORMAL HIGH (ref 65–99)
Potassium: 4.1 mmol/L (ref 3.5–5.2)
Sodium: 137 mmol/L (ref 134–144)
eGFR: 70 mL/min/{1.73_m2} (ref >59)

## 2021-01-25 LAB — TSH: TSH: 8.36 u[IU]/mL — ABNORMAL HIGH (ref 0.450–4.500)

## 2021-01-25 LAB — MAGNESIUM: Magnesium: 1.9 mg/dL (ref 1.6–2.3)

## 2021-01-27 ENCOUNTER — Other Ambulatory Visit: Payer: Self-pay | Admitting: *Deleted

## 2021-01-27 ENCOUNTER — Telehealth: Payer: Self-pay | Admitting: Family Medicine

## 2021-01-27 DIAGNOSIS — Z125 Encounter for screening for malignant neoplasm of prostate: Secondary | ICD-10-CM

## 2021-01-27 DIAGNOSIS — Z1159 Encounter for screening for other viral diseases: Secondary | ICD-10-CM

## 2021-01-27 DIAGNOSIS — I25119 Atherosclerotic heart disease of native coronary artery with unspecified angina pectoris: Secondary | ICD-10-CM

## 2021-01-27 DIAGNOSIS — R7303 Prediabetes: Secondary | ICD-10-CM

## 2021-01-27 DIAGNOSIS — R7989 Other specified abnormal findings of blood chemistry: Secondary | ICD-10-CM

## 2021-01-27 DIAGNOSIS — E785 Hyperlipidemia, unspecified: Secondary | ICD-10-CM

## 2021-01-27 NOTE — Telephone Encounter (Signed)
-----   Message from Cloyd Stagers, RT sent at 01/13/2021 11:06 AM EDT ----- Regarding: Lab Orders for Friday 7.15.2022 Please place lab orders for Friday 7.15.2022, office visit for physical on Friday 7.22.2022 Thank you, Dyke Maes RT(R)

## 2021-01-28 ENCOUNTER — Telehealth: Payer: Self-pay | Admitting: Internal Medicine

## 2021-01-28 NOTE — Telephone Encounter (Signed)
Patient calling  Would like to know if his lab orders could be changed to [facility] He will be going there this week for other labs as well

## 2021-01-29 NOTE — Telephone Encounter (Addendum)
Furth, Cadence H, PA-C  Picard-Tagnolli, Coleen, Advertising account executive (1:39 PM)   Spoke to patient over the phone regarding over read for cardiac CT and pulmonary nodule that needs more imaging. Please order chest CT. Also recommended he follow-up with PCP for this, says he sees her later this week.     Per Cadence, CT to be ordered WITHOUT contrast.  CT ordered placed.  Message has been sent to precert.   Pt has been scheduled for chest CT without contrast Tuesday 02/18/21 at 8:00 AM at [facility].  Pt is aware to arrive at the [place] at [facility] at 7:30 AM.  Pt states that he is having lab work later this week then he has physical scheduled with Dr. Diona Browner (PCP) next Friday. Will include Dr. Diona Browner in the message to make aware of update.   Information regarding appointment posted to MyChart per pt request.  Pt has no further questions at this time.

## 2021-01-30 ENCOUNTER — Telehealth: Payer: Self-pay | Admitting: Family Medicine

## 2021-01-30 DIAGNOSIS — R7989 Other specified abnormal findings of blood chemistry: Secondary | ICD-10-CM

## 2021-01-30 NOTE — Telephone Encounter (Signed)
-----   Message from Ellamae Sia sent at 01/30/2021  7:24 AM EDT ----- Regarding: RE: add an order??? Yes, you need to order. We can't do their labs under their orders. Thanks! ----- Message ----- From: Jinny Sanders, MD Sent: 01/29/2021   6:59 PM EDT To: Ellamae Sia Subject: RE: add an order???                            Already ordered by cardiology... or do you need me to put in orders separately? ----- Message ----- From: Ellamae Sia Sent: 01/29/2021  12:36 PM EDT To: Jinny Sanders, MD Subject: add an order???                                Can you put the order in for cardiology for the ft4, check phone note. Thanks, T

## 2021-01-31 ENCOUNTER — Other Ambulatory Visit: Payer: Self-pay

## 2021-01-31 ENCOUNTER — Other Ambulatory Visit (INDEPENDENT_AMBULATORY_CARE_PROVIDER_SITE_OTHER): Payer: BC Managed Care – PPO

## 2021-01-31 DIAGNOSIS — E785 Hyperlipidemia, unspecified: Secondary | ICD-10-CM | POA: Diagnosis not present

## 2021-01-31 DIAGNOSIS — R7989 Other specified abnormal findings of blood chemistry: Secondary | ICD-10-CM

## 2021-01-31 DIAGNOSIS — Z125 Encounter for screening for malignant neoplasm of prostate: Secondary | ICD-10-CM | POA: Diagnosis not present

## 2021-01-31 DIAGNOSIS — R7303 Prediabetes: Secondary | ICD-10-CM

## 2021-01-31 DIAGNOSIS — Z1159 Encounter for screening for other viral diseases: Secondary | ICD-10-CM

## 2021-01-31 LAB — COMPREHENSIVE METABOLIC PANEL
ALT: 34 U/L (ref 0–53)
AST: 30 U/L (ref 0–37)
Albumin: 4.1 g/dL (ref 3.5–5.2)
Alkaline Phosphatase: 68 U/L (ref 39–117)
BUN: 12 mg/dL (ref 6–23)
CO2: 32 mEq/L (ref 19–32)
Calcium: 9.2 mg/dL (ref 8.4–10.5)
Chloride: 100 mEq/L (ref 96–112)
Creatinine, Ser: 1.05 mg/dL (ref 0.40–1.50)
GFR: 80.25 mL/min (ref >60.00)
Glucose, Bld: 113 mg/dL — ABNORMAL HIGH (ref 70–99)
Potassium: 3.9 mEq/L (ref 3.5–5.1)
Sodium: 139 mEq/L (ref 135–145)
Total Bilirubin: 0.6 mg/dL (ref 0.2–1.2)
Total Protein: 6.9 g/dL (ref 6.0–8.3)

## 2021-01-31 LAB — LIPID PANEL
Cholesterol: 144 mg/dL (ref 0–200)
HDL: 31 mg/dL — ABNORMAL LOW (ref >39.00)
LDL Cholesterol: 95 mg/dL (ref 0–99)
NonHDL: 112.61
Total CHOL/HDL Ratio: 5
Triglycerides: 89 mg/dL (ref 0.0–149.0)
VLDL: 17.8 mg/dL (ref 0.0–40.0)

## 2021-01-31 LAB — PSA: PSA: 0.25 ng/mL (ref 0.10–4.00)

## 2021-01-31 LAB — HEMOGLOBIN A1C: Hgb A1c MFr Bld: 6.3 % (ref 4.6–6.5)

## 2021-01-31 LAB — T4, FREE: Free T4: 0.8 ng/dL (ref 0.60–1.60)

## 2021-02-03 LAB — HEPATITIS C ANTIBODY
Hepatitis C Ab: NONREACTIVE
SIGNAL TO CUT-OFF: 0.04 (ref <1.00)

## 2021-02-03 NOTE — Progress Notes (Signed)
Follow-up Outpatient Visit Date: 02/05/2021  Primary Care Provider: Jinny Sanders, MD [place] [address]  Chief Complaint: Follow-up syncope and abnormal coronary CTA  HPI:  Todd Owens is a 55 y.o. 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  He was last seen in the office by Cadence Furth, PA, on 01/24/2021 for follow-up of chest pain and to discuss results of preceding coronary CTA, which showed very high CAC score with abnormal CTFFR in the distal LAD and distal LCx.  While at the office visit, the patient had a syncopal episode thought be be vasovagal, as he has had similar events in the past with blood draws.  Vital signs and EKG showed no significant abnormality, though HR was noted to be 50 bpm.  Event monitor was placed, which Mr. Petron was only able to wear for ~1 week due to heavy perspiration.  Today, Mr. Wagley reports that he feels about the same as at prior visits.  He denies chest pain and has stable exertional dyspnea that has been stable since his 2 bouts of COVID-19.  He has stable dependent edema in his feet.  He has not had any further syncopal episodes nor palpitations.  Today, he recounts an episode of transient sharp left-sided chest pain that occurred while driving.  And only lasted for few minutes and occurred the same week that he was diagnosed with COVID-19.  It has not recurred.  --------------------------------------------------------------------------------------------------  Cardiovascular History & Procedures: Cardiovascular Problems: Vasovagal syncope Coronary artery disease (see CTA below)   Risk Factors: Hypertension, hyperlipidemia, obesity, family history, and male gender   Cath/PCI: None   CV Surgery: None   EP Procedures and Devices: None   Non-Invasive  Evaluation(s): Coronary CTA (01/09/2021): Poor quality study due to attenuation (obesity).  CAC 1155 (99th percentile).  Moderate to severe multivessel CAD of up to 50-69%.  CT FFR 0.72 in distal LAD and 0.77 in distal LCx. TTE (09/20/2019): Technically difficult study with LVEF of 55-60%.  Normal RV size and function.  Normal PA pressure.  No significant valvular abnormality. Exercise tolerance test (08/10/16): Fair exercise capacity (6 minutes, 48 seconds) achieving 155 bpm (91% MPHR). Hypertensive response to exercise. No significant ST-T segment or T-wave changes. Low risk study. Transthoracic echocardiogram (07/25/16): Normal LV size with moderate LVH. LVEF 60-65%. Normal diastolic function. Mildly dilated aortic root. Mild left atrial enlargement. Normal RV size and function. No significant valvular abnormalities. Pharmacologic myocardial perfusion stress test (04/28/11): Normal study without ischemia or scar. LVEF 54%.  Recent CV Pertinent Labs: Lab Results  Component Value Date   CHOL 144 01/31/2021   HDL 31.00 (L) 01/31/2021   LDLCALC 95 01/31/2021   TRIG 89.0 01/31/2021   CHOLHDL 5 01/31/2021   K 3.9 01/31/2021   MG 1.9 01/24/2021   BUN 12 01/31/2021   BUN 14 01/24/2021   CREATININE 1.05 01/31/2021    Past medical and surgical history were reviewed and updated in EPIC.  Current Meds  Medication Sig   ASPIRIN LOW DOSE 81 MG EC tablet TAKE 1 TABLET BY MOUTH EVERY DAY   losartan (COZAAR) 100 MG tablet TAKE 1 TABLET BY MOUTH EVERY DAY   nitroGLYCERIN (NITROSTAT) 0.4 MG SL tablet Place 0.4 mg under the tongue every 5 (five) minutes as needed for chest pain.   omeprazole (PRILOSEC) 40 MG capsule TAKE 1  CAPSULE BY MOUTH EVERY DAY   simvastatin (ZOCOR) 40 MG tablet TAKE 1 TABLET BY MOUTH EVERY DAY   triamterene-hydrochlorothiazide (MAXZIDE-25) 37.5-25 MG tablet TAKE 1 TABLET BY MOUTH EVERY DAY    Allergies: Patient has no known allergies.  Social History   Tobacco Use   Smoking  status: Former    Packs/day: 1.00    Years: 15.00    Pack years: 15.00    Types: Cigarettes    Quit date: 1995    Years since quitting: 27.5   Smokeless tobacco: Never  Vaping Use   Vaping Use: Never used  Substance Use Topics   Alcohol use: No   Drug use: No    Family History  Problem Relation Age of Onset   Hypertension Mother    Heart disease Mother        pacemaker   Hypertension Father    Stroke Father    Heart disease Father 65   Heart attack Father    Heart disease Sister    Heart attack Sister 16   Heart failure Sister    Colon cancer Neg Hx    Esophageal cancer Neg Hx    Liver cancer Neg Hx    Pancreatic cancer Neg Hx    Stomach cancer Neg Hx    Rectal cancer Neg Hx     Review of Systems: A 12-system review of systems was performed and was negative except as noted in the HPI.  --------------------------------------------------------------------------------------------------  Physical Exam: BP 120/76 (BP Location: Left Arm, Patient Position: Sitting, Cuff Size: Large)   Pulse 65   Ht 5\' 9"  (1.753 m)   Wt (!) 335 lb (152 kg)   SpO2 97%   BMI 49.47 kg/m   General:  NAD. Neck: No JVD or HJR, though evaluation is limited by body habitus Lungs: Clear to auscultation bilaterally without wheezes or crackles. Heart: Regular rate and rhythm without murmurs, rubs, or gallops. Abdomen: Soft, nontender, nondistended. Extremities: Trace pretibial edema bilaterally.   Lab Results  Component Value Date   WBC 5.8 01/24/2021   HGB 14.1 01/24/2021   HCT 40.8 01/24/2021   MCV 99 (H) 01/24/2021   PLT 290 01/24/2021    Lab Results  Component Value Date   NA 139 01/31/2021   K 3.9 01/31/2021   CL 100 01/31/2021   CO2 32 01/31/2021   BUN 12 01/31/2021   CREATININE 1.05 01/31/2021   GLUCOSE 113 (H) 01/31/2021   ALT 34 01/31/2021    Lab Results  Component Value Date   CHOL 144 01/31/2021   HDL 31.00 (L) 01/31/2021   LDLCALC 95 01/31/2021   TRIG 89.0  01/31/2021   CHOLHDL 5 01/31/2021    --------------------------------------------------------------------------------------------------  ASSESSMENT AND PLAN: Syncope: No further episodes since Mr. Swindell's last visit.  Episodes are most consistent with vasovagal syncope.  I have personally reviewed preliminary tracings from event monitor placed at his last visit, which showed no significant arrhythmia.  Given concern for multivessel CAD on recent coronary CTA, I recommended that we proceed with cardiac catheterization and possible PCI.  Atypical chest pain, dyspnea on exertion, and abnormal coronary CTA: Symptoms are stable without any further chest pain since Mr. Strebeck's last visit.  He has stable exertional dyspnea that he attributes to his 2 COVID-19 infections.  We have personally reviewed his recent coronary CTA, which shows heavy multivessel calcification.  CT FFR suggests significant disease involving distal portions of the LAD and RCA.  On personal review of the CT FFR  images, I am concerned that there could be more significant disease proximally in the LAD that may be obscured by calcification.  We have discussed conservative therapy versus proceeding with cardiac catheterization and have agreed to the latter option.  We will continue Mr. Mano current medications.  Hyperlipidemia: Most recent lipid panel last week was notable for LDL of 95, which is suboptimal in the setting of multivessel CAD.  We have agreed to switch simvastatin to rosuvastatin 40 mg daily.  Mr. Seese would benefit from repeat lipid panel and ALT in 3 months to assess response.  Morbid obesity: BMI greater than 40.  Weight loss encouraged through diet and exercise.  Shared Decision Making/Informed Consent The risks [stroke (1 in 1000), death (1 in 1000), kidney failure [usually temporary] (1 in 500), bleeding (1 in 200), allergic reaction [possibly serious] (1 in 200)], benefits (diagnostic support and management of  coronary artery disease) and alternatives of a cardiac catheterization were discussed in detail with Mr. Pilot and he is willing to proceed.  Follow-up: Return to clinic 2 weeks after catheterization.  Of note, Mr. Deruiter wishes to transition his care to [facility] following his catheterization, as this is more convenient for him.  Nelva Bush, MD 02/05/2021 8:44 AM

## 2021-02-03 NOTE — H&P (View-Only) (Signed)
Follow-up Outpatient Visit Date: 02/05/2021  Primary Care Provider: Jinny Sanders, MD [place] [address]  Chief Complaint: Follow-up syncope and abnormal coronary CTA  HPI:  Mr. Castorena is a 55 y.o. 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  He was last seen in the office by Cadence Furth, PA, on 01/24/2021 for follow-up of chest pain and to discuss results of preceding coronary CTA, which showed very high CAC score with abnormal CTFFR in the distal LAD and distal LCx.  While at the office visit, the patient had a syncopal episode thought be be vasovagal, as he has had similar events in the past with blood draws.  Vital signs and EKG showed no significant abnormality, though HR was noted to be 50 bpm.  Event monitor was placed, which Mr. Carreiro was only able to wear for ~1 week due to heavy perspiration.  Today, Mr. Ditommaso reports that he feels about the same as at prior visits.  He denies chest pain and has stable exertional dyspnea that has been stable since his 2 bouts of COVID-19.  He has stable dependent edema in his feet.  He has not had any further syncopal episodes nor palpitations.  Today, he recounts an episode of transient sharp left-sided chest pain that occurred while driving.  And only lasted for few minutes and occurred the same week that he was diagnosed with COVID-19.  It has not recurred.  --------------------------------------------------------------------------------------------------  Cardiovascular History & Procedures: Cardiovascular Problems: Vasovagal syncope Coronary artery disease (see CTA below)   Risk Factors: Hypertension, hyperlipidemia, obesity, family history, and male gender   Cath/PCI: None   CV Surgery: None   EP Procedures and Devices: None   Non-Invasive  Evaluation(s): Coronary CTA (01/09/2021): Poor quality study due to attenuation (obesity).  CAC 1155 (99th percentile).  Moderate to severe multivessel CAD of up to 50-69%.  CT FFR 0.72 in distal LAD and 0.77 in distal LCx. TTE (09/20/2019): Technically difficult study with LVEF of 55-60%.  Normal RV size and function.  Normal PA pressure.  No significant valvular abnormality. Exercise tolerance test (08/10/16): Fair exercise capacity (6 minutes, 48 seconds) achieving 155 bpm (91% MPHR). Hypertensive response to exercise. No significant ST-T segment or T-wave changes. Low risk study. Transthoracic echocardiogram (07/25/16): Normal LV size with moderate LVH. LVEF 60-65%. Normal diastolic function. Mildly dilated aortic root. Mild left atrial enlargement. Normal RV size and function. No significant valvular abnormalities. Pharmacologic myocardial perfusion stress test (04/28/11): Normal study without ischemia or scar. LVEF 54%.  Recent CV Pertinent Labs: Lab Results  Component Value Date   CHOL 144 01/31/2021   HDL 31.00 (L) 01/31/2021   LDLCALC 95 01/31/2021   TRIG 89.0 01/31/2021   CHOLHDL 5 01/31/2021   K 3.9 01/31/2021   MG 1.9 01/24/2021   BUN 12 01/31/2021   BUN 14 01/24/2021   CREATININE 1.05 01/31/2021    Past medical and surgical history were reviewed and updated in EPIC.  Current Meds  Medication Sig   ASPIRIN LOW DOSE 81 MG EC tablet TAKE 1 TABLET BY MOUTH EVERY DAY   losartan (COZAAR) 100 MG tablet TAKE 1 TABLET BY MOUTH EVERY DAY   nitroGLYCERIN (NITROSTAT) 0.4 MG SL tablet Place 0.4 mg under the tongue every 5 (five) minutes as needed for chest pain.   omeprazole (PRILOSEC) 40 MG capsule TAKE 1  CAPSULE BY MOUTH EVERY DAY   simvastatin (ZOCOR) 40 MG tablet TAKE 1 TABLET BY MOUTH EVERY DAY   triamterene-hydrochlorothiazide (MAXZIDE-25) 37.5-25 MG tablet TAKE 1 TABLET BY MOUTH EVERY DAY    Allergies: Patient has no known allergies.  Social History   Tobacco Use   Smoking  status: Former    Packs/day: 1.00    Years: 15.00    Pack years: 15.00    Types: Cigarettes    Quit date: 1995    Years since quitting: 27.5   Smokeless tobacco: Never  Vaping Use   Vaping Use: Never used  Substance Use Topics   Alcohol use: No   Drug use: No    Family History  Problem Relation Age of Onset   Hypertension Mother    Heart disease Mother        pacemaker   Hypertension Father    Stroke Father    Heart disease Father 92   Heart attack Father    Heart disease Sister    Heart attack Sister 37   Heart failure Sister    Colon cancer Neg Hx    Esophageal cancer Neg Hx    Liver cancer Neg Hx    Pancreatic cancer Neg Hx    Stomach cancer Neg Hx    Rectal cancer Neg Hx     Review of Systems: A 12-system review of systems was performed and was negative except as noted in the HPI.  --------------------------------------------------------------------------------------------------  Physical Exam: BP 120/76 (BP Location: Left Arm, Patient Position: Sitting, Cuff Size: Large)   Pulse 65   Ht 5\' 9"  (1.753 m)   Wt (!) 335 lb (152 kg)   SpO2 97%   BMI 49.47 kg/m   General:  NAD. Neck: No JVD or HJR, though evaluation is limited by body habitus Lungs: Clear to auscultation bilaterally without wheezes or crackles. Heart: Regular rate and rhythm without murmurs, rubs, or gallops. Abdomen: Soft, nontender, nondistended. Extremities: Trace pretibial edema bilaterally.   Lab Results  Component Value Date   WBC 5.8 01/24/2021   HGB 14.1 01/24/2021   HCT 40.8 01/24/2021   MCV 99 (H) 01/24/2021   PLT 290 01/24/2021    Lab Results  Component Value Date   NA 139 01/31/2021   K 3.9 01/31/2021   CL 100 01/31/2021   CO2 32 01/31/2021   BUN 12 01/31/2021   CREATININE 1.05 01/31/2021   GLUCOSE 113 (H) 01/31/2021   ALT 34 01/31/2021    Lab Results  Component Value Date   CHOL 144 01/31/2021   HDL 31.00 (L) 01/31/2021   LDLCALC 95 01/31/2021   TRIG 89.0  01/31/2021   CHOLHDL 5 01/31/2021    --------------------------------------------------------------------------------------------------  ASSESSMENT AND PLAN: Syncope: No further episodes since Mr. Mitter's last visit.  Episodes are most consistent with vasovagal syncope.  I have personally reviewed preliminary tracings from event monitor placed at his last visit, which showed no significant arrhythmia.  Given concern for multivessel CAD on recent coronary CTA, I recommended that we proceed with cardiac catheterization and possible PCI.  Atypical chest pain, dyspnea on exertion, and abnormal coronary CTA: Symptoms are stable without any further chest pain since Mr. Vowell's last visit.  He has stable exertional dyspnea that he attributes to his 2 COVID-19 infections.  We have personally reviewed his recent coronary CTA, which shows heavy multivessel calcification.  CT FFR suggests significant disease involving distal portions of the LAD and RCA.  On personal review of the CT FFR  images, I am concerned that there could be more significant disease proximally in the LAD that may be obscured by calcification.  We have discussed conservative therapy versus proceeding with cardiac catheterization and have agreed to the latter option.  We will continue Mr. Mulvehill current medications.  Hyperlipidemia: Most recent lipid panel last week was notable for LDL of 95, which is suboptimal in the setting of multivessel CAD.  We have agreed to switch simvastatin to rosuvastatin 40 mg daily.  Mr. Proudfoot would benefit from repeat lipid panel and ALT in 3 months to assess response.  Morbid obesity: BMI greater than 40.  Weight loss encouraged through diet and exercise.  Shared Decision Making/Informed Consent The risks [stroke (1 in 1000), death (1 in 1000), kidney failure [usually temporary] (1 in 500), bleeding (1 in 200), allergic reaction [possibly serious] (1 in 200)], benefits (diagnostic support and management of  coronary artery disease) and alternatives of a cardiac catheterization were discussed in detail with Mr. Cherry and he is willing to proceed.  Follow-up: Return to clinic 2 weeks after catheterization.  Of note, Mr. Levan wishes to transition his care to [facility] following his catheterization, as this is more convenient for him.  Nelva Bush, MD 02/05/2021 8:44 AM

## 2021-02-04 ENCOUNTER — Telehealth: Payer: Self-pay

## 2021-02-04 NOTE — Telephone Encounter (Signed)
Noted  

## 2021-02-04 NOTE — Telephone Encounter (Signed)
I spoke with pt and pt plans to [facility] [place] when his wife gets home from work because pt does not want to drive. Pt is not having any difficulty breathing and no swelling in lips, tongue, mouth, throat or neck. Pt said swelling has gone down a  little since earlier this morning.no available appts at [facility] & ED precautions given and pt voiced understanding and appreciative. Advised pt could try covered ice pack on eye to help swelling. Pt voiced understanding. Pt has already taken benadryl. Sending note to Dr Diona Browner.

## 2021-02-04 NOTE — Telephone Encounter (Signed)
[facility] - Client TELEPHONE ADVICE RECORD Access[facility]urse Patient Name: LAMARION MCEVERS Gender: Male DOB: 1966/04/11 Age: 55 Y 11 M 1 D Return Phone Number: [phone] (Primary) Address: City/ State/ Zip: [place] [address] Client [facility] - Client Client Site [place] - Day Physician Eliezer Lofts - MD Contact Type Call Who Is Calling Patient / Member / Family / Caregiver Call Type Triage / Clinical Relationship To Patient Self Return Phone Number [phone] (Primary) Chief Complaint Eye Pain Reason for Call Symptomatic / Request for Health Information Initial Comment Pt was stung in the eyelid and it is swollen. He was stung 10-15 times. Translation No Nurse Assessment Nurse: Alveta Heimlich, RN, Rise Paganini Date/Time (Eastern Time): 02/04/2021 8:49:02 AM Confirm and document reason for call. If symptomatic, describe symptoms. ---Caller states he was stung 10 to 15 times on his neck , arms and above the right eye. Eye swollen this am, almost shut. He took 2 benadryl's last night. there is no pain Does the patient have any new or worsening symptoms? ---Yes Will a triage be completed? ---Yes Related visit to physician within the last 2 weeks? ---No Does the PT have any chronic conditions? (i.e. diabetes, asthma, this includes High risk factors for pregnancy, etc.) ---Yes List chronic conditions. ---HTN, cholesterol,acid reflux Is this a behavioral health or substance abuse call? ---No Guidelines Guideline Title Affirmed Question Affirmed Notes Nurse Date/Time (Eastern Time) Bee or Yellow Jacket Sting Normal local reaction to bee, wasp, or yellow jacket sting Myer Haff 02/04/2021 8:51:56 AM Eye - Swelling MODERATESEVERE eyelid swelling on one side (Exception: due to a mosquito bite) Alveta Heimlich, RN, [facility] 02/04/2021 8:54:51 AM PLEASE NOTE: All timestamps contained within this report  are represented as [place] Standard Time. CONFIDENTIALTY NOTICE: This fax transmission is intended only for the addressee. It contains information that is legally privileged, confidential or otherwise protected from use or disclosure. If you are not the intended recipient, you are strictly prohibited from reviewing, disclosing, copying using or disseminating any of this information or taking any action in reliance on or regarding this information. If you have received this fax in error, please notify us immediately by telephone so that we can arrange for its return to [place]. Phone: [phone], Toll-Free: [phone], Fax: [phone] Page: 2 of 2 Call Id: [id number] Rh[place]. Time Eilene Ghazi Time) Disposition Final User 02/04/2021 8:54:29 [place], RN, [facility] 02/04/2021 8:57:16 AM See PCP within 24 Hours Yes Alveta Heimlich, RN, Ali Lowe Disagree/Comply Comply Caller Understands Yes PreDisposition Call Doctor Care Advice Given Per Guideline HOME CARE: * You should be able to treat this at home. CALL BACK IF: * Develops difficulty breathing or swallowing (mainly during the 2 hours after the sting; Call 911) * Swelling becomes huge * Sting begins to look infected * You become worse CARE ADVICE given per Bee or Yellow Jacket Sting (Adult) guideline. SEE PCP WITHIN 24 HOURS: CALL BACK IF: * Fever occurs * You become worse CARE ADVICE given per Eye - Swelling (Adult) guideline. Comments User: Debby Bud, RN Date/Time Eilene Ghazi Time): 02/04/2021 8:58:52 AM He will have to look up an urgent care. User: Debby Bud, RN Date/Time Eilene Ghazi Time): 02/04/2021 8:59:27 AM HE says the office has no appointments, He has already tried. Referrals GO TO FACILITY UNDECIDED

## 2021-02-05 ENCOUNTER — Ambulatory Visit (INDEPENDENT_AMBULATORY_CARE_PROVIDER_SITE_OTHER): Payer: BC Managed Care – PPO | Admitting: Internal Medicine

## 2021-02-05 ENCOUNTER — Other Ambulatory Visit: Payer: Self-pay

## 2021-02-05 ENCOUNTER — Encounter: Payer: Self-pay | Admitting: Internal Medicine

## 2021-02-05 VITALS — BP 120/76 | HR 65 | Ht 69.0 in | Wt 335.0 lb

## 2021-02-05 DIAGNOSIS — R0600 Dyspnea, unspecified: Secondary | ICD-10-CM | POA: Diagnosis not present

## 2021-02-05 DIAGNOSIS — R0789 Other chest pain: Secondary | ICD-10-CM

## 2021-02-05 DIAGNOSIS — R55 Syncope and collapse: Secondary | ICD-10-CM | POA: Diagnosis not present

## 2021-02-05 DIAGNOSIS — R0609 Other forms of dyspnea: Secondary | ICD-10-CM

## 2021-02-05 DIAGNOSIS — E785 Hyperlipidemia, unspecified: Secondary | ICD-10-CM

## 2021-02-05 DIAGNOSIS — R931 Abnormal findings on diagnostic imaging of heart and coronary circulation: Secondary | ICD-10-CM

## 2021-02-05 MED ORDER — ROSUVASTATIN CALCIUM 40 MG PO TABS: 40.0000 mg | ORAL_TABLET | Freq: Every day | ORAL | 3 refills | Status: DC

## 2021-02-05 NOTE — Patient Instructions (Signed)
Medication Instructions:  Your physician has recommended you make the following change in your medication:   STOP taking Simvastatin  START Rosuvastatin (Crestor) 40mg  daily   *If you need a refill on your cardiac medications before your next appointment, please call your pharmacy*   Lab Work:  None ordered  Testing/Procedures:  You are scheduled for a Cardiac Catheterization on Friday, August 5 with Dr. Harrell Gave End.  1. Please arrive at the [facility] (Main Entrance A) at [facility]: [place], [place] [address] at 7:00 AM (This time is two hours before your procedure to ensure your preparation). Free valet parking service is available.   Special note: Every effort is made to have your procedure done on time. Please understand that emergencies sometimes delay scheduled procedures.  2. Diet: Do not eat solid foods after midnight.  The patient may have clear liquids until 5am upon the day of the procedure.  3. Labs: You have had required labs previously. None ordered today.   4. Medication instructions in preparation for your procedure:   Contrast Allergy: No   On the morning of your procedure, take your Aspirin and any morning medicines NOT listed above.  You may use sips of water.  5. Plan for one night stay--bring personal belongings. 6. Bring a current list of your medications and current insurance cards. 7. You MUST have a responsible person to drive you home. 8. Someone MUST be with you the first 24 hours after you arrive home or your discharge will be delayed. 9. Please wear clothes that are easy to get on and off and wear slip-on shoes.  Thank you for allowing [place] to care for you!   -- [facility]    Follow-Up: At [facility], you and your health needs are our priority.  As part of our continuing mission to provide you with exceptional heart care, we have created designated Provider Care Teams.  These  Care Teams include your primary Cardiologist (physician) and Advanced Practice Providers (APPs -  Physician Assistants and Nurse Practitioners) who all work together to provide you with the care you need, when you need it.  We recommend signing up for the patient portal called "MyChart".  Sign up information is provided on this After Visit Summary.  MyChart is used to connect with patients for Virtual Visits (Telemedicine).  Patients are able to view lab/test results, encounter notes, upcoming appointments, etc.  Non-urgent messages can be sent to your provider as well.   To learn more about what you can do with MyChart, go to [email or web address].    Your next appointment:   2 week(s) after cath procedure  The format for your next appointment:   In Person  Provider:   You may see Dr. Harrell Gave End or one of the following Advanced Practice Providers on your designated Care Team:   Murray Hodgkins, NP Christell Faith, PA-C Marrianne Mood, PA-C Cadence [place], [place]

## 2021-02-07 ENCOUNTER — Encounter: Payer: Self-pay | Admitting: Family Medicine

## 2021-02-07 ENCOUNTER — Other Ambulatory Visit: Payer: Self-pay

## 2021-02-07 ENCOUNTER — Ambulatory Visit (INDEPENDENT_AMBULATORY_CARE_PROVIDER_SITE_OTHER): Payer: BC Managed Care – PPO | Admitting: Family Medicine

## 2021-02-07 VITALS — BP 110/70 | HR 64 | Temp 98.8°F | Ht 69.0 in | Wt 336.2 lb

## 2021-02-07 DIAGNOSIS — R7989 Other specified abnormal findings of blood chemistry: Secondary | ICD-10-CM

## 2021-02-07 DIAGNOSIS — Z0000 Encounter for general adult medical examination without abnormal findings: Secondary | ICD-10-CM

## 2021-02-07 DIAGNOSIS — R7303 Prediabetes: Secondary | ICD-10-CM

## 2021-02-07 DIAGNOSIS — I10 Essential (primary) hypertension: Secondary | ICD-10-CM | POA: Diagnosis not present

## 2021-02-07 DIAGNOSIS — E785 Hyperlipidemia, unspecified: Secondary | ICD-10-CM | POA: Diagnosis not present

## 2021-02-07 DIAGNOSIS — R911 Solitary pulmonary nodule: Secondary | ICD-10-CM

## 2021-02-07 DIAGNOSIS — G4733 Obstructive sleep apnea (adult) (pediatric): Secondary | ICD-10-CM

## 2021-02-07 DIAGNOSIS — I2510 Atherosclerotic heart disease of native coronary artery without angina pectoris: Secondary | ICD-10-CM

## 2021-02-07 DIAGNOSIS — Z9189 Other specified personal risk factors, not elsewhere classified: Secondary | ICD-10-CM

## 2021-02-07 MED ORDER — TRULICITY 0.75 MG/0.5ML ~~LOC~~ SOAJ: 0.7500 mg | SUBCUTANEOUS | 11 refills | Status: DC

## 2021-02-07 NOTE — Assessment & Plan Note (Signed)
Now on crestor 40 mg daily.. need CMET and lipid panel in 3 months.  LDL goal < 70.

## 2021-02-07 NOTE — Assessment & Plan Note (Signed)
Has upcoming cardiac cath... GLP1 RA indicated.

## 2021-02-07 NOTE — Patient Instructions (Addendum)
Continue work on low carb  low fat diet.  Start weekly Trulicity weekly.

## 2021-02-07 NOTE — Assessment & Plan Note (Addendum)
Trial of GLP1 RAD for CAD, prediabetes and weight management of morbid obesity. Encouraged exercise, weight loss, healthy eating habits.  Pt given info on low carb diet.. he has been trying hard to change diet but has been eating Daily oranges and bannanas.  info provided.

## 2021-02-07 NOTE — Assessment & Plan Note (Signed)
Stable, chronic.  Continue CPAP Needs weight loss.  '

## 2021-02-07 NOTE — Assessment & Plan Note (Signed)
Weight issues linked with CAD and other comorbidities including OSA, HTN and prediabetes.

## 2021-02-07 NOTE — Progress Notes (Signed)
Patient ID: Todd Owens, male    DOB: 1966-02-28, 55 y.o.   MRN: [id number]  This visit was conducted in person.  BP 110/70   Pulse 64   Temp 98.8 F (37.1 C) (Temporal)   Ht '5\' 9"'$  (1.753 m)   Wt (!) 336 lb 4 oz (152.5 kg)   SpO2 96%   BMI 49.66 kg/m    CC:  Chief Complaint  Patient presents with   Annual Exam    Subjective:   HPI: Todd Owens is a 55 y.o. male presenting on 02/07/2021 for Annual Exam  ED visit in 12/13/2020 for chest pain... Troponin was negative and EKG was unremarkable. He was discharged home.  12/20/2020 cardiology OV reviewed:  Cardiac CT showed severe 3 vessel disease.  01/24/2021 follow up with cardiology: when discussing the planned cardiac cath he had an episode of  vasovagal syncope  EKG unremarkable and pt wore event monitor for 1 week. 02/05/2021 OV with Dr. Saunders Revel cardiology:   simvastatin changed to rosuvastatin 40 mg daily as LDL was 95 and not at goal < 70  Cardiac cath planned in 02/2021.  Hypertension:  At goal on losartan and maxide. BP Readings from Last 3 Encounters:  02/07/21 110/70  02/05/21 120/76  01/24/21 120/80  Using medication without problems or lightheadedness:  none Chest pain with exertion:none Edema:none Short of breath: some exertional CP. Average home BPs: Other issues:  Prediabetes  Lab Results  Component Value Date   HGBA1C 6.3 01/31/2021   Wt Readings from Last 3 Encounters:  02/07/21 (!) 336 lb 4 oz (152.5 kg)  02/05/21 (!) 335 lb (152 kg)  01/24/21 (!) 332 lb (150.6 kg)   He has been cutting back on sugar, less bread.   OSA: on CPAP   Has chest CT schedule in early August:  Incncidental finding on coronary CT of : Suspicion of an 8 mm central left upper lobe pulmonary nodule on 01/11, incompletely imaged.  Remote smoking history 10 pack yr history.  Relevant past medical, surgical, family and social history reviewed and updated as indicated. Interim medical history since our last visit  reviewed. Allergies and medications reviewed and updated. Outpatient Medications Prior to Visit  Medication Sig Dispense Refill   ASPIRIN LOW DOSE 81 MG EC tablet TAKE 1 TABLET BY MOUTH EVERY DAY 90 tablet 0   losartan (COZAAR) 100 MG tablet TAKE 1 TABLET BY MOUTH EVERY DAY 90 tablet 0   nitroGLYCERIN (NITROSTAT) 0.4 MG SL tablet Place 0.4 mg under the tongue every 5 (five) minutes as needed for chest pain.     omeprazole (PRILOSEC) 40 MG capsule TAKE 1 CAPSULE BY MOUTH EVERY DAY 90 capsule 0   rosuvastatin (CRESTOR) 40 MG tablet Take 1 tablet (40 mg total) by mouth daily. 30 tablet 3   triamterene-hydrochlorothiazide (MAXZIDE-25) 37.5-25 MG tablet TAKE 1 TABLET BY MOUTH EVERY DAY 90 tablet 0   No facility-administered medications prior to visit.     Per HPI unless specifically indicated in ROS section below Review of Systems  Constitutional:  Negative for fatigue and fever.  HENT:  Negative for ear pain.   Eyes:  Negative for pain.  Respiratory:  Negative for cough and shortness of breath.   Cardiovascular:  Negative for chest pain, palpitations and leg swelling.  Gastrointestinal:  Negative for abdominal pain.  Genitourinary:  Negative for dysuria.  Musculoskeletal:  Negative for arthralgias.  Neurological:  Negative for syncope, light-headedness and headaches.  Psychiatric/Behavioral:  Negative  for dysphoric mood.   Objective:  BP 110/70   Pulse 64   Temp 98.8 F (37.1 C) (Temporal)   Ht '5\' 9"'$  (1.753 m)   Wt (!) 336 lb 4 oz (152.5 kg)   SpO2 96%   BMI 49.66 kg/m   Wt Readings from Last 3 Encounters:  02/07/21 (!) 336 lb 4 oz (152.5 kg)  02/05/21 (!) 335 lb (152 kg)  01/24/21 (!) 332 lb (150.6 kg)      Physical Exam Constitutional:      General: He is not in acute distress.    Appearance: Normal appearance. He is well-developed. He is obese. He is not ill-appearing or toxic-appearing.  HENT:     Head: Normocephalic and atraumatic.     Right Ear: Hearing, tympanic  membrane, ear canal and external ear normal.     Left Ear: Hearing, tympanic membrane, ear canal and external ear normal.     Nose: Nose normal.     Mouth/Throat:     Pharynx: Uvula midline.  Eyes:     General: Lids are normal. Lids are everted, no foreign bodies appreciated.     Conjunctiva/sclera: Conjunctivae normal.     Pupils: Pupils are equal, round, and reactive to light.  Neck:     Thyroid: No thyroid mass or thyromegaly.     Vascular: No carotid bruit.     Trachea: Trachea and phonation normal.  Cardiovascular:     Rate and Rhythm: Normal rate and regular rhythm.     Pulses: Normal pulses.     Heart sounds: S1 normal and S2 normal. No murmur heard.   No gallop.  Pulmonary:     Breath sounds: Normal breath sounds. No wheezing, rhonchi or rales.  Abdominal:     General: Bowel sounds are normal.     Palpations: Abdomen is soft.     Tenderness: There is no abdominal tenderness. There is no guarding or rebound.     Hernia: No hernia is present.  Musculoskeletal:     Cervical back: Normal range of motion and neck supple.  Lymphadenopathy:     Cervical: No cervical adenopathy.  Skin:    General: Skin is warm and dry.     Findings: No rash.  Neurological:     Mental Status: He is alert.     Cranial Nerves: No cranial nerve deficit.     Sensory: No sensory deficit.     Gait: Gait normal.     Deep Tendon Reflexes: Reflexes are normal and symmetric.  Psychiatric:        Speech: Speech normal.        Behavior: Behavior normal.        Judgment: Judgment normal.      Results for orders placed or performed in visit on 01/31/21  PSA  Result Value Ref Range   PSA 0.25 0.10 - 4.00 ng/mL  Hepatitis C antibody  Result Value Ref Range   Hepatitis C Ab NON-REACTIVE NON-REACTIVE   SIGNAL TO CUT-OFF 0.04 <1.00  Comprehensive metabolic panel  Result Value Ref Range   Sodium 139 135 - 145 mEq/L   Potassium 3.9 3.5 - 5.1 mEq/L   Chloride 100 96 - 112 mEq/L   CO2 32 19 - 32  mEq/L   Glucose, Bld 113 (H) 70 - 99 mg/dL   BUN 12 6 - 23 mg/dL   Creatinine, Ser 1.05 0.40 - 1.50 mg/dL   Total Bilirubin 0.6 0.2 - 1.2 mg/dL   Alkaline Phosphatase 68  39 - 117 U/L   AST 30 0 - 37 U/L   ALT 34 0 - 53 U/L   Total Protein 6.9 6.0 - 8.3 g/dL   Albumin 4.1 3.5 - 5.2 g/dL   GFR 80.25 >60.00 mL/min   Calcium 9.2 8.4 - 10.5 mg/dL  Lipid panel  Result Value Ref Range   Cholesterol 144 0 - 200 mg/dL   Triglycerides 89.0 0.0 - 149.0 mg/dL   HDL 31.00 (L) >39.00 mg/dL   VLDL 17.8 0.0 - 40.0 mg/dL   LDL Cholesterol 95 0 - 99 mg/dL   Total CHOL/HDL Ratio 5    NonHDL 112.61   Hemoglobin A1c  Result Value Ref Range   Hgb A1c MFr Bld 6.3 4.6 - 6.5 %  T4, free  Result Value Ref Range   Free T4 0.80 0.60 - 1.60 ng/dL    This visit occurred during the SARS-CoV-2 public health emergency.  Safety protocols were in place, including screening questions prior to the visit, additional usage of staff PPE, and extensive cleaning of exam room while observing appropriate contact time as indicated for disinfecting solutions.   COVID 19 screen:  No recent travel or known exposure to COVID19 The patient denies respiratory symptoms of COVID 19 at this time. The importance of social distancing was discussed today.   Assessment and Plan   The patient's preventative maintenance and recommended screening tests for an annual wellness exam were reviewed in full today. Brought up to date unless services declined.  Counselled on the importance of diet, exercise, and its role in overall health and mortality. The patient's FH and SH was reviewed, including their home life, tobacco status, and drug and alcohol status.   Vaccine: Td 2014.  Discussed COVID19 vaccine side effects and benefits. Strongly encouraged the patient to get the vaccine. Questions answered. Consider shingrix. Former smoker, Quit 15-20 years ago Prostate,  Lab Results  Component Value Date   PSA 0.25 01/31/2021   PSA 0.23  12/06/2019   PSA 0.24 12/07/2018  Colon cancer screen:  Tubular adenoma 02/18/2018.. plan repeat in 5  years, Dr. Silverio Decamp.  No STD testing requested. [id number]  Hep C:not indicated.     Problem List Items Addressed This Visit     CAD (coronary artery disease)    Has upcoming cardiac cath... GLP1 RA indicated.       Essential hypertension, benign    Stable, chronic.  Continue current medication.   Losartan 100 mg daily   maxide 1 tablet daily       Hyperlipidemia    Now on crestor 40 mg daily.. need CMET and lipid panel in 3 months.  LDL goal < 70.       Morbid obesity ([place])    Weight issues linked with CAD and other comorbidities including OSA, HTN and prediabetes.       Relevant Medications   Dulaglutide (TRULICITY) [id number] SOPN   Obstructive sleep apnea    Stable, chronic.  Continue CPAP Needs weight loss.  '       Prediabetes    Trial of GLP1 RAD for CAD, prediabetes and weight management of morbid obesity. Encouraged exercise, weight loss, healthy eating habits.  Pt given info on low carb diet.. he has been trying hard to change diet but has been eating Daily oranges and bannanas.  info provided.        Other Visit Diagnoses     Routine general medical examination at a health care facility    -  Primary   Pulmonary nodule less than 1 cm in diameter with moderate to high risk for malignant neoplasm       Elevated TSH          Meds ordered this encounter  Medications   Dulaglutide (TRULICITY) [id number] SOPN    Sig: Inject 0.75 mg into the skin once a week.    Dispense:  2 mL    Refill:  11     Eliezer Lofts, MD

## 2021-02-07 NOTE — Assessment & Plan Note (Signed)
Stable, chronic.  Continue current medication.   Losartan 100 mg daily   maxide 1 tablet daily

## 2021-02-10 ENCOUNTER — Telehealth: Payer: Self-pay | Admitting: *Deleted

## 2021-02-10 NOTE — Telephone Encounter (Signed)
Patient called stating that he is a little confused. Patient stated that he has been on Simvastatin 40 mg and was changed to Crestor 40 mg. Patient stated that these are the same medications. Patient was advised that both treat cholesterol but are different medications. Advised patient that message will go back to Dr. Diona Browner to make sure that he is on the correct medication.Todd Owens

## 2021-02-11 NOTE — Telephone Encounter (Signed)
Todd Owens notified as instructed by telephone.  Patient states understanding.

## 2021-02-11 NOTE — Telephone Encounter (Signed)
Call   Yes.. correct.Marland Kitchen stop simvastatin and change to crestor 40 mg as prescribed by cardiology.

## 2021-02-18 ENCOUNTER — Other Ambulatory Visit: Payer: Self-pay

## 2021-02-18 ENCOUNTER — Ambulatory Visit
Admission: RE | Admit: 2021-02-18 | Discharge: 2021-02-18 | Disposition: A | Discharge status: Home / Self Care | Payer: BC Managed Care – PPO | Source: Ambulatory Visit | Attending: Medical | Admitting: Medical

## 2021-02-18 DIAGNOSIS — R9389 Abnormal findings on diagnostic imaging of other specified body structures: Secondary | ICD-10-CM | POA: Diagnosis present

## 2021-02-18 DIAGNOSIS — R911 Solitary pulmonary nodule: Secondary | ICD-10-CM | POA: Diagnosis present

## 2021-02-20 ENCOUNTER — Telehealth: Payer: Self-pay | Admitting: *Deleted

## 2021-02-20 NOTE — Telephone Encounter (Signed)
Cardiac catheterization scheduled at [facility] for: Friday February 21, 2021 [facility] Main Entrance A [facility]) at: 7 AM   No solid food after midnight prior to cath, clear liquids until 5 AM day of procedure.  Medication instructions: Hold: -Triamterene/HCT-AM of procedure  -Except hold medications  AM meds can be  taken pre-cath with sips of water including:   aspirin 81 mg   Confirmed patient has responsible adult to drive home post procedure and be with patient first 24 hours after arriving home.  Patients are allowed one visitor in the waiting room during the time they are at the hospital for their procedure. Both patient and visitor must wear a mask once they enter the hospital.   Patient reports does not currently have any symptoms concerning for COVID-19 and no household members with COVID-19 like illness.      Reviewed procedure/mask/visitor instructions with patient.

## 2021-02-21 ENCOUNTER — Encounter (HOSPITAL_COMMUNITY): Payer: Self-pay | Admitting: Internal Medicine

## 2021-02-21 ENCOUNTER — Other Ambulatory Visit: Payer: Self-pay

## 2021-02-21 ENCOUNTER — Ambulatory Visit (HOSPITAL_COMMUNITY)
Admission: RE | Disposition: A | Discharge status: Home / Self Care | Payer: Self-pay | Source: Home / Self Care | Attending: Internal Medicine

## 2021-02-21 ENCOUNTER — Ambulatory Visit (HOSPITAL_COMMUNITY)
Admission: RE | Admit: 2021-02-21 | Discharge: 2021-02-21 | Disposition: A | Discharge status: Home / Self Care | Payer: BC Managed Care – PPO | Attending: Internal Medicine | Admitting: Internal Medicine

## 2021-02-21 DIAGNOSIS — Z8616 Personal history of COVID-19: Secondary | ICD-10-CM | POA: Diagnosis not present

## 2021-02-21 DIAGNOSIS — I110 Hypertensive heart disease with heart failure: Secondary | ICD-10-CM | POA: Insufficient documentation

## 2021-02-21 DIAGNOSIS — R55 Syncope and collapse: Secondary | ICD-10-CM | POA: Diagnosis present

## 2021-02-21 DIAGNOSIS — R0609 Other forms of dyspnea: Secondary | ICD-10-CM | POA: Diagnosis not present

## 2021-02-21 DIAGNOSIS — R0789 Other chest pain: Secondary | ICD-10-CM | POA: Insufficient documentation

## 2021-02-21 DIAGNOSIS — R0600 Dyspnea, unspecified: Secondary | ICD-10-CM | POA: Diagnosis not present

## 2021-02-21 DIAGNOSIS — I509 Heart failure, unspecified: Secondary | ICD-10-CM | POA: Insufficient documentation

## 2021-02-21 DIAGNOSIS — I2510 Atherosclerotic heart disease of native coronary artery without angina pectoris: Secondary | ICD-10-CM | POA: Diagnosis not present

## 2021-02-21 DIAGNOSIS — R931 Abnormal findings on diagnostic imaging of heart and coronary circulation: Secondary | ICD-10-CM | POA: Diagnosis present

## 2021-02-21 DIAGNOSIS — G4733 Obstructive sleep apnea (adult) (pediatric): Secondary | ICD-10-CM | POA: Insufficient documentation

## 2021-02-21 DIAGNOSIS — E785 Hyperlipidemia, unspecified: Secondary | ICD-10-CM | POA: Insufficient documentation

## 2021-02-21 DIAGNOSIS — Z7982 Long term (current) use of aspirin: Secondary | ICD-10-CM | POA: Diagnosis not present

## 2021-02-21 DIAGNOSIS — Z6841 Body Mass Index (BMI) 40.0 and over, adult: Secondary | ICD-10-CM | POA: Insufficient documentation

## 2021-02-21 DIAGNOSIS — Z87891 Personal history of nicotine dependence: Secondary | ICD-10-CM | POA: Diagnosis not present

## 2021-02-21 DIAGNOSIS — Z79899 Other long term (current) drug therapy: Secondary | ICD-10-CM | POA: Diagnosis not present

## 2021-02-21 HISTORY — PX: INTRAVASCULAR PRESSURE WIRE/FFR STUDY: CATH118243

## 2021-02-21 HISTORY — PX: LEFT HEART CATH AND CORONARY ANGIOGRAPHY: CATH118249

## 2021-02-21 HISTORY — PX: CARDIAC CATHETERIZATION: SHX172

## 2021-02-21 LAB — POCT ACTIVATED CLOTTING TIME
Activated Clotting Time: 254 seconds
Activated Clotting Time: 260 seconds

## 2021-02-21 SURGERY — LEFT HEART CATH AND CORONARY ANGIOGRAPHY
Anesthesia: LOCAL

## 2021-02-21 MED ORDER — MIDAZOLAM HCL 2 MG/2ML IJ SOLN
INTRAMUSCULAR | Status: AC
  Filled 2021-02-21: qty 2

## 2021-02-21 MED ORDER — VERAPAMIL HCL 2.5 MG/ML IV SOLN
INTRAVENOUS | Status: DC | PRN
  Administered 2021-02-21: 10 mL via INTRA_ARTERIAL

## 2021-02-21 MED ORDER — HEPARIN SODIUM (PORCINE) 1000 UNIT/ML IJ SOLN
INTRAMUSCULAR | Status: DC | PRN
  Administered 2021-02-21: 6000 [IU] via INTRAVENOUS
  Administered 2021-02-21: 2000 [IU] via INTRAVENOUS
  Administered 2021-02-21: 8000 [IU] via INTRAVENOUS

## 2021-02-21 MED ORDER — SODIUM CHLORIDE 0.9 % IV SOLN: 250.0000 mL | INTRAVENOUS | Status: DC | PRN

## 2021-02-21 MED ORDER — HEPARIN SODIUM (PORCINE) 1000 UNIT/ML IJ SOLN
INTRAMUSCULAR | Status: AC
  Filled 2021-02-21: qty 1

## 2021-02-21 MED ORDER — FENTANYL CITRATE (PF) 100 MCG/2ML IJ SOLN
INTRAMUSCULAR | Status: DC | PRN
  Administered 2021-02-21: 50 ug via INTRAVENOUS

## 2021-02-21 MED ORDER — SODIUM CHLORIDE 0.9 % IV SOLN: INTRAVENOUS | Status: DC

## 2021-02-21 MED ORDER — ONDANSETRON HCL 4 MG/2ML IJ SOLN: 4.0000 mg | Freq: Four times a day (QID) | INTRAMUSCULAR | Status: DC | PRN

## 2021-02-21 MED ORDER — HYDRALAZINE HCL 20 MG/ML IJ SOLN: 10.0000 mg | INTRAMUSCULAR | Status: DC | PRN

## 2021-02-21 MED ORDER — LABETALOL HCL 5 MG/ML IV SOLN: 10.0000 mg | INTRAVENOUS | Status: DC | PRN

## 2021-02-21 MED ORDER — SODIUM CHLORIDE 0.9% FLUSH: 3.0000 mL | Freq: Two times a day (BID) | INTRAVENOUS | Status: DC

## 2021-02-21 MED ORDER — HEPARIN (PORCINE) IN NACL 1000-0.9 UT/500ML-% IV SOLN
INTRAVENOUS | Status: AC
  Filled 2021-02-21: qty 500

## 2021-02-21 MED ORDER — VERAPAMIL HCL 2.5 MG/ML IV SOLN
INTRAVENOUS | Status: AC
  Filled 2021-02-21: qty 2

## 2021-02-21 MED ORDER — LIDOCAINE HCL (PF) 1 % IJ SOLN
INTRAMUSCULAR | Status: DC | PRN
  Administered 2021-02-21: 2 mL via INTRADERMAL

## 2021-02-21 MED ORDER — SODIUM CHLORIDE 0.9 % IV SOLN: INTRAVENOUS | Status: AC

## 2021-02-21 MED ORDER — HEPARIN (PORCINE) IN NACL 1000-0.9 UT/500ML-% IV SOLN
INTRAVENOUS | Status: DC | PRN
  Administered 2021-02-21 (×2): 500 mL

## 2021-02-21 MED ORDER — MIDAZOLAM HCL 2 MG/2ML IJ SOLN
INTRAMUSCULAR | Status: DC | PRN
  Administered 2021-02-21: 1 mg via INTRAVENOUS

## 2021-02-21 MED ORDER — IOHEXOL 350 MG/ML SOLN
INTRAVENOUS | Status: DC | PRN
  Administered 2021-02-21: 95 mL via INTRA_ARTERIAL

## 2021-02-21 MED ORDER — ASPIRIN 81 MG PO CHEW: 81.0000 mg | CHEWABLE_TABLET | ORAL | Status: DC

## 2021-02-21 MED ORDER — FENTANYL CITRATE (PF) 100 MCG/2ML IJ SOLN
INTRAMUSCULAR | Status: AC
  Filled 2021-02-21: qty 2

## 2021-02-21 MED ORDER — SODIUM CHLORIDE 0.9% FLUSH: 3.0000 mL | INTRAVENOUS | Status: DC | PRN

## 2021-02-21 MED ORDER — ASPIRIN 81 MG PO CHEW: 81.0000 mg | CHEWABLE_TABLET | Freq: Every day | ORAL | Status: DC

## 2021-02-21 MED ORDER — LIDOCAINE HCL (PF) 1 % IJ SOLN
INTRAMUSCULAR | Status: AC
  Filled 2021-02-21: qty 30

## 2021-02-21 MED ORDER — NITROGLYCERIN 1 MG/10 ML FOR IR/CATH LAB
INTRA_ARTERIAL | Status: AC
  Filled 2021-02-21: qty 10

## 2021-02-21 MED ORDER — ACETAMINOPHEN 325 MG PO TABS: 650.0000 mg | ORAL_TABLET | ORAL | Status: DC | PRN

## 2021-02-21 SURGICAL SUPPLY — 12 items
CATH 5FR JL3.5 JR4 ANG PIG MP (CATHETERS) ×2 IMPLANT
CATH VISTA GUIDE 6FR XBLAD3.5 (CATHETERS) ×2 IMPLANT
DEVICE RAD COMP TR BAND LRG (VASCULAR PRODUCTS) ×2 IMPLANT
GLIDESHEATH SLEND SS 6F .021 (SHEATH) ×2 IMPLANT
GUIDEWIRE INQWIRE 1.5J.035X260 (WIRE) ×1 IMPLANT
GUIDEWIRE PRESSURE X 175 (WIRE) ×2 IMPLANT
INQWIRE 1.5J .035X260CM (WIRE) ×2
KIT ESSENTIALS PG (KITS) ×2 IMPLANT
KIT HEART LEFT (KITS) ×2 IMPLANT
PACK CARDIAC CATHETERIZATION (CUSTOM PROCEDURE TRAY) ×2 IMPLANT
TRANSDUCER W/STOPCOCK (MISCELLANEOUS) ×2 IMPLANT
TUBING CIL FLEX 10 FLL-RA (TUBING) ×2 IMPLANT

## 2021-02-21 NOTE — Research (Signed)
Identify Informed Consent   Subject Name: Todd Owens  Subject met inclusion and exclusion criteria.  The informed consent form, study requirements and expectations were reviewed with the subject and questions and concerns were addressed prior to the signing of the consent form.  The subject verbalized understanding of the trial requirements.  The subject agreed to participate in the Identify trial and signed the informed consent at 0750 on 02/21/21.  The informed consent was obtained prior to performance of any protocol-specific procedures for the subject.  A copy of the signed informed consent was given to the subject and a copy was placed in the subject's medical record.   Alvina Strother

## 2021-02-21 NOTE — Interval H&P Note (Signed)
History and Physical Interval Note:  02/21/2021 9:13 AM  Vernetta Honey  has presented today for surgery, with the diagnosis of syncope, dyspnea on exertion, and abnormal coronary CTA.  The various methods of treatment have been discussed with the patient and family. After consideration of risks, benefits and other options for treatment, the patient has consented to  Procedure(s): LEFT HEART CATH AND CORONARY ANGIOGRAPHY (N/A) as a surgical intervention.  The patient's history has been reviewed, patient examined, no change in status, stable for surgery.  I have reviewed the patient's chart and labs.  Questions were answered to the patient's satisfaction.    Cath Lab Visit (complete for each Cath Lab visit)  Clinical Evaluation Leading to the Procedure:   ACS: No.  Non-ACS:    Anginal Classification: CCS III  Anti-ischemic medical therapy: No Therapy  Non-Invasive Test Results: Intermediate-risk stress test findings: cardiac mortality 1-3%/year (three-vessel CAD by coronary CTA, abnormal CTFFR in distal LAD and distal LCx)  Prior CABG: No previous CABG  Koa Zoeller

## 2021-02-24 MED FILL — Nitroglycerin IV Soln 100 MCG/ML in D5W: INTRA_ARTERIAL | Qty: 10 | Status: AC

## 2021-03-07 ENCOUNTER — Ambulatory Visit: Payer: BC Managed Care – PPO | Admitting: Family Medicine

## 2021-03-12 ENCOUNTER — Ambulatory Visit: Payer: BC Managed Care – PPO | Admitting: Internal Medicine

## 2021-03-12 ENCOUNTER — Encounter: Payer: Self-pay | Admitting: Internal Medicine

## 2021-03-12 ENCOUNTER — Other Ambulatory Visit: Payer: Self-pay

## 2021-03-12 VITALS — BP 120/70 | HR 66 | Ht 69.0 in | Wt 330.1 lb

## 2021-03-12 DIAGNOSIS — I10 Essential (primary) hypertension: Secondary | ICD-10-CM | POA: Diagnosis not present

## 2021-03-12 DIAGNOSIS — I2510 Atherosclerotic heart disease of native coronary artery without angina pectoris: Secondary | ICD-10-CM

## 2021-03-12 DIAGNOSIS — I5032 Chronic diastolic (congestive) heart failure: Secondary | ICD-10-CM

## 2021-03-12 DIAGNOSIS — Z79899 Other long term (current) drug therapy: Secondary | ICD-10-CM

## 2021-03-12 MED ORDER — FUROSEMIDE 40 MG PO TABS: 40.0000 mg | ORAL_TABLET | Freq: Every day | ORAL | 5 refills | Status: DC

## 2021-03-12 NOTE — Patient Instructions (Signed)
Medication Instructions:   Your physician has recommended you make the following change in your medication:   STOP taking Triamterene / HCTZ  START Furosemide (Lasix) 40 mg daily   *If you need a refill on your cardiac medications before your next appointment, please call your pharmacy*   Lab Work:  Your physician recommends that you return for lab work in: [place] (BMET)   Testing/Procedures:  None ordered   Follow-Up: At [facility], you and your health needs are our priority.  As part of our continuing mission to provide you with exceptional heart care, we have created designated Provider Care Teams.  These Care Teams include your primary Cardiologist (physician) and Advanced Practice Providers (APPs -  Physician Assistants and Nurse Practitioners) who all work together to provide you with the care you need, when you need it.  We recommend signing up for the patient portal called "MyChart".  Sign up information is provided on this After Visit Summary.  MyChart is used to connect with patients for Virtual Visits (Telemedicine).  Patients are able to view lab/test results, encounter notes, upcoming appointments, etc.  Non-urgent messages can be sent to your provider as well.   To learn more about what you can do with MyChart, go to [email or web address].    Your next appointment:   6 week(s)  The format for your next appointment:   In Person  Provider:   You may see Dr. Saunders Revel or one of the following Advanced Practice Providers on your designated Care Team:   Murray Hodgkins, NP Christell Faith, PA-C Marrianne Mood, PA-C Cadence [place], [place]

## 2021-03-12 NOTE — Progress Notes (Signed)
Follow-up Outpatient Visit Date: 03/12/2021  Primary Care Provider: Jinny Sanders, MD [place] [address]  Chief Complaint: Follow-up coronary artery disease and shortness of breath  HPI:  [name] is a 55 y.o. male with history of moderate multivessel coronary artery disease (not hemodynamically significant by RFR), chronic HFpEF, syncope, hypertension, hyperlipidemia, obstructive sleep apnea, and morbid obesity, who presents for follow-up of coronary artery disease and syncope.  I last saw him in late July, at which time he reported stable exertional dyspnea and dependent edema.  We agreed to move forward with cardiac catheterization earlier this month, which showed moderate LAD and ramus intermedius disease, neither Todd Owens was significant by RFR.  LVEDP was noted to be mildly-moderately elevated.  Today, [name] reports feeling quite a bit better after his recent diagnostic catheterization.  He has not had any chest pain.  Shortness of breath is mild.  He still has leg swelling.  He denies palpitations and lightheadedness, as well as further syncopal episodes.  He has occasional mild soreness at his right radial catheterization site.  He denies bleeding.  --------------------------------------------------------------------------------------------------  Cardiovascular History & Procedures: Cardiovascular Problems: Vasovagal syncope Coronary artery disease (see CTA and catheterization below) HFpEF   Risk Factors: Hypertension, hyperlipidemia, obesity, family history, and male gender   Cath/PCI: LHC (8/5/2.22): LMCA normal.  60% proximal and 30% mid LAD stenoses (RFR 0.91), 50% ostial ramus intermedius lesion (RFR 1.0), normal LCx, and dominant RCA with minimal luminal irregularities.  LVEF 55-65% with mildly to moderately elevated filling pressure (LVEDP 20-25 mmHg).     CV Surgery: None   EP Procedures and Devices: None   Non-Invasive  Evaluation(s): Coronary CTA (01/09/2021): Poor quality study due to attenuation (obesity).  CAC 1155 (99th percentile).  Moderate to severe multivessel CAD of up to 50-69%.  CT FFR 0.72 in distal LAD and 0.77 in distal LCx. TTE (09/20/2019): Technically difficult study with LVEF of 55-60%.  Normal RV size and function.  Normal PA pressure.  No significant valvular abnormality. Exercise tolerance test (08/10/16): Fair exercise capacity (6 minutes, 48 seconds) achieving 155 bpm (91% MPHR). Hypertensive response to exercise. No significant ST-T segment or T-wave changes. Low risk study. Transthoracic echocardiogram (07/25/16): Normal LV size with moderate LVH. LVEF 60-65%. Normal diastolic function. Mildly dilated aortic root. Mild left atrial enlargement. Normal RV size and function. No significant valvular abnormalities. Pharmacologic myocardial perfusion stress test (04/28/11): Normal study without ischemia or scar. LVEF 54%.  Recent CV Pertinent Labs: Lab Results  Component Value Date   CHOL 144 01/31/2021   HDL 31.00 (L) 01/31/2021   LDLCALC 95 01/31/2021   TRIG 89.0 01/31/2021   CHOLHDL 5 01/31/2021   K 3.9 01/31/2021   MG 1.9 01/24/2021   BUN 12 01/31/2021   BUN 14 01/24/2021   CREATININE 1.05 01/31/2021    Past medical and surgical history were reviewed and updated in EPIC.  Current Meds  Medication Sig   ASPIRIN LOW DOSE 81 MG EC tablet TAKE 1 TABLET BY MOUTH EVERY DAY (Patient taking differently: Take 81 mg by mouth daily.)   Dulaglutide (TRULICITY) A999333 0000000 SOPN Inject 0.75 mg into the skin once a week.   furosemide (LASIX) 40 MG tablet Take 1 tablet (40 mg total) by mouth daily.   losartan (COZAAR) 100 MG tablet TAKE 1 TABLET BY MOUTH EVERY DAY (Patient taking differently: Take 100 mg by mouth daily.)   nitroGLYCERIN (NITROSTAT) 0.4 MG SL tablet Place 0.4 mg under  the tongue every 5 (five) minutes as needed for chest pain.   omeprazole (PRILOSEC) 40 MG capsule TAKE 1 CAPSULE BY  MOUTH EVERY DAY (Patient taking differently: Take 40 mg by mouth daily.)   rosuvastatin (CRESTOR) 40 MG tablet Take 1 tablet (40 mg total) by mouth daily.   [DISCONTINUED] triamterene-hydrochlorothiazide (MAXZIDE-25) 37.5-25 MG tablet TAKE 1 TABLET BY MOUTH EVERY DAY (Patient taking differently: Take 1 tablet by mouth daily.)    Allergies: Patient has no known allergies.  Social History   Tobacco Use   Smoking status: Former    Packs/day: 1.00    Years: 15.00    Pack years: 15.00    Types: Cigarettes    Quit date: 1995    Years since quitting: 27.6   Smokeless tobacco: Never  Vaping Use   Vaping Use: Never used  Substance Use Topics   Alcohol use: No   Drug use: No    Family History  Problem Relation Age of Onset   Hypertension Mother    Heart disease Mother        pacemaker   Hypertension Father    Stroke Father    Heart disease Father 30   Heart attack Father    Heart disease Sister    Heart attack Sister 28   Heart failure Sister    Colon cancer Neg Hx    Esophageal cancer Neg Hx    Liver cancer Neg Hx    Pancreatic cancer Neg Hx    Stomach cancer Neg Hx    Rectal cancer Neg Hx     Review of Systems: A 12-system review of systems was performed and was negative except as noted in the HPI.  --------------------------------------------------------------------------------------------------  Physical Exam: BP 120/70 (BP Location: Left Arm, Patient Position: Sitting, Cuff Size: Large)   Pulse 66   Ht '5\' 9"'$  (1.753 m)   Wt (!) 330 lb 2 oz (149.7 kg)   SpO2 96%   BMI 48.75 kg/m   General:  NAD. Neck: No JVD or HJR. Lungs: Clear to auscultation bilaterally without wheezes or crackles. Heart: Regular rate and rhythm without murmurs, rubs, or gallops. Abdomen: Soft, nontender, nondistended. Extremities: 1+ pretibial edema.  Right radial arteriotomy site well-healed with 2+ pulse.  No hematoma.  EKG: Normal sinus rhythm without abnormality.  Lab Results   Component Value Date   WBC 5.8 01/24/2021   HGB 14.1 01/24/2021   HCT 40.8 01/24/2021   MCV 99 (H) 01/24/2021   PLT 290 01/24/2021    Lab Results  Component Value Date   NA 139 01/31/2021   K 3.9 01/31/2021   CL 100 01/31/2021   CO2 32 01/31/2021   BUN 12 01/31/2021   CREATININE 1.05 01/31/2021   GLUCOSE 113 (H) 01/31/2021   ALT 34 01/31/2021    Lab Results  Component Value Date   CHOL 144 01/31/2021   HDL 31.00 (L) 01/31/2021   LDLCALC 95 01/31/2021   TRIG 89.0 01/31/2021   CHOLHDL 5 01/31/2021    --------------------------------------------------------------------------------------------------  ASSESSMENT AND PLAN: Nonobstructive coronary artery disease: Catheterization earlier this month revealed nonhemodynamically significant moderate CAD involving the LAD and ramus intermedius.  Despite no intervention being undertaken, Mr. Duch feels better today.  We will work on medical therapy to prevent progression of disease including continued low-dose aspirin and high intensity statin therapy.  Chronic HFpEF: Mr. Eklund has continued lower extremity edema and was noted to have mildly elevated LVEDP in the setting of preserved LVEF on recent catheterization, consistent  with diastolic dysfunction/HFpEF.  We have agreed to discontinue Maxide and start furosemide 40 mg daily.  We will obtain a BMP in 1 to 2 weeks to ensure stable renal function and potassium.  Hypertension: Blood pressure well controlled today.  As above, we will transition from [facility] to furosemide.  Continue losartan 100 mg daily.  Morbid obesity: BMI remains greater than 40.  Weight loss encouraged through diet and exercise.  Follow-up: Return to clinic in 6 weeks.  Nelva Bush, MD 03/14/2021 7:58 AM

## 2021-03-14 ENCOUNTER — Encounter: Payer: Self-pay | Admitting: Internal Medicine

## 2021-03-14 DIAGNOSIS — I5032 Chronic diastolic (congestive) heart failure: Secondary | ICD-10-CM | POA: Insufficient documentation

## 2021-03-19 ENCOUNTER — Other Ambulatory Visit (INDEPENDENT_AMBULATORY_CARE_PROVIDER_SITE_OTHER): Payer: BC Managed Care – PPO

## 2021-03-19 ENCOUNTER — Other Ambulatory Visit: Payer: Self-pay

## 2021-03-19 DIAGNOSIS — I10 Essential (primary) hypertension: Secondary | ICD-10-CM

## 2021-03-19 DIAGNOSIS — Z79899 Other long term (current) drug therapy: Secondary | ICD-10-CM

## 2021-03-19 DIAGNOSIS — I2510 Atherosclerotic heart disease of native coronary artery without angina pectoris: Secondary | ICD-10-CM

## 2021-03-20 LAB — BASIC METABOLIC PANEL
BUN/Creatinine Ratio: 11 (ref 9–20)
BUN: 11 mg/dL (ref 6–24)
CO2: 26 mmol/L (ref 20–29)
Calcium: 9.1 mg/dL (ref 8.7–10.2)
Chloride: 103 mmol/L (ref 96–106)
Creatinine, Ser: 0.96 mg/dL (ref 0.76–1.27)
Glucose: 113 mg/dL — ABNORMAL HIGH (ref 65–99)
Potassium: 3.6 mmol/L (ref 3.5–5.2)
Sodium: 143 mmol/L (ref 134–144)
eGFR: 93 mL/min/{1.73_m2} (ref >59)

## 2021-03-26 ENCOUNTER — Other Ambulatory Visit: Payer: Self-pay | Admitting: Family Medicine

## 2021-03-28 ENCOUNTER — Other Ambulatory Visit: Payer: Self-pay | Admitting: Family Medicine

## 2021-04-10 ENCOUNTER — Telehealth: Payer: Self-pay | Admitting: Family Medicine

## 2021-04-10 DIAGNOSIS — E785 Hyperlipidemia, unspecified: Secondary | ICD-10-CM

## 2021-04-10 DIAGNOSIS — R7303 Prediabetes: Secondary | ICD-10-CM

## 2021-04-10 DIAGNOSIS — R7989 Other specified abnormal findings of blood chemistry: Secondary | ICD-10-CM

## 2021-04-10 NOTE — Telephone Encounter (Signed)
-----   Message from Ellamae Sia sent at 04/08/2021  2:30 PM EDT ----- Regarding: Lab orders for Friday, 10.7.22 Lab orders for a 3 month follow up appt.

## 2021-04-18 ENCOUNTER — Other Ambulatory Visit: Payer: Self-pay | Admitting: Family Medicine

## 2021-04-24 ENCOUNTER — Encounter: Payer: Self-pay | Admitting: Internal Medicine

## 2021-04-24 ENCOUNTER — Ambulatory Visit (INDEPENDENT_AMBULATORY_CARE_PROVIDER_SITE_OTHER): Payer: BC Managed Care – PPO | Admitting: Internal Medicine

## 2021-04-24 ENCOUNTER — Other Ambulatory Visit: Payer: Self-pay

## 2021-04-24 VITALS — BP 120/76 | HR 70 | Ht 69.0 in | Wt 327.0 lb

## 2021-04-24 DIAGNOSIS — E782 Mixed hyperlipidemia: Secondary | ICD-10-CM

## 2021-04-24 DIAGNOSIS — I2510 Atherosclerotic heart disease of native coronary artery without angina pectoris: Secondary | ICD-10-CM

## 2021-04-24 DIAGNOSIS — I5032 Chronic diastolic (congestive) heart failure: Secondary | ICD-10-CM

## 2021-04-24 NOTE — Patient Instructions (Signed)
Medication Instructions:   Your physician has recommended you make the following change in your medication:   Do not take your Crestor for 1 month and see if your joint pain improves. Then send [place] a MyChart message as to how you are doing.  *If you need a refill on your cardiac medications before your next appointment, please call your pharmacy*   Lab Work: None ordered If you have labs (blood work) drawn today and your tests are completely normal, you will receive your results only by: [place] (if you have MyChart) OR A paper copy in the mail If you have any lab test that is abnormal or we need to change your treatment, we will call you to review the results.   Testing/Procedures: None ordered   Follow-Up: At [facility], you and your health needs are our priority.  As part of our continuing mission to provide you with exceptional heart care, we have created designated Provider Care Teams.  These Care Teams include your primary Cardiologist (physician) and Advanced Practice Providers (APPs -  Physician Assistants and Nurse Practitioners) who all work together to provide you with the care you need, when you need it.  We recommend signing up for the patient portal called "MyChart".  Sign up information is provided on this After Visit Summary.  MyChart is used to connect with patients for Virtual Visits (Telemedicine).  Patients are able to view lab/test results, encounter notes, upcoming appointments, etc.  Non-urgent messages can be sent to your provider as well.   To learn more about what you can do with MyChart, go to [email or web address].    Your next appointment:   3 month(s)  The format for your next appointment:   In Person  Provider:   You may see Dr. Saunders Revel or one of the following Advanced Practice Providers on your designated Care Team:   Murray Hodgkins, NP Christell Faith, PA-C Marrianne Mood, PA-C Cadence Kathlen Mody, [place]   Other Instructions

## 2021-04-24 NOTE — Progress Notes (Signed)
Follow-up Outpatient Visit Date: 04/24/2021  Primary Care Provider: Jinny Sanders, MD [address] [place] [address]  Chief Complaint: Joint pain  HPI:  Todd Owens is a 55 y.o. male with history of moderate multivessel coronary artery disease (not hemodynamically significant by RFR), chronic HFpEF, syncope, hypertension, hyperlipidemia, obstructive sleep apnea, and morbid obesity, who presents for follow-up of coronary artery disease and HFpEF.  I last saw him in in August after his catheterization, at which time Strehl reported feeling better.  We agreed to add furosemide in place of Maxide given edema and elevated filling pressures noted on catheterization.  Today, Todd Owens reports that he feels a little bit better and has noticed some improvement in his edema.  He has stable mild exertional dyspnea.  He has not had any chest pain, palpitations, or lightheadedness.  He has been experiencing some joint pains especially in his knees and shoulders.  He wonders if this could be due to one of his medications.  He has also noticed some easy bruising but otherwise no significant bleeding.Marland Kitchen  --------------------------------------------------------------------------------------------------  Cardiovascular History & Procedures: Cardiovascular Problems: Vasovagal syncope Coronary artery disease (see CTA and catheterization below) HFpEF   Risk Factors: Hypertension, hyperlipidemia, obesity, family history, and male gender   Cath/PCI: LHC (8/5/2.22): LMCA normal.  60% proximal and 30% mid LAD stenoses (RFR 0.91), 50% ostial ramus intermedius lesion (RFR 1.0), normal LCx, and dominant RCA with minimal luminal irregularities.  LVEF 55-65% with mildly to moderately elevated filling pressure (LVEDP 20-25 mmHg).     CV Surgery: None   EP Procedures and Devices: None   Non-Invasive Evaluation(s): Coronary CTA (01/09/2021): Poor quality study due to attenuation (obesity).  CAC 1155 (99th  percentile).  Moderate to severe multivessel CAD of up to 50-69%.  CT FFR 0.72 in distal LAD and 0.77 in distal LCx. TTE (09/20/2019): Technically difficult study with LVEF of 55-60%.  Normal RV size and function.  Normal PA pressure.  No significant valvular abnormality. Exercise tolerance test (08/10/16): Fair exercise capacity (6 minutes, 48 seconds) achieving 155 bpm (91% MPHR). Hypertensive response to exercise. No significant ST-T segment or T-wave changes. Low risk study. Transthoracic echocardiogram (07/25/16): Normal LV size with moderate LVH. LVEF 60-65%. Normal diastolic function. Mildly dilated aortic root. Mild left atrial enlargement. Normal RV size and function. No significant valvular abnormalities. Pharmacologic myocardial perfusion stress test (04/28/11): Normal study without ischemia or scar. LVEF 54%.  Recent CV Pertinent Labs: Lab Results  Component Value Date   CHOL 144 01/31/2021   HDL 31.00 (L) 01/31/2021   LDLCALC 95 01/31/2021   TRIG 89.0 01/31/2021   CHOLHDL 5 01/31/2021   K 3.6 03/19/2021   MG 1.9 01/24/2021   BUN 11 03/19/2021   CREATININE 0.96 03/19/2021    Past medical and surgical history were reviewed and updated in EPIC.  Current Meds  Medication Sig   aspirin (ASPIRIN LOW DOSE) 81 MG EC tablet Take 1 tablet (81 mg total) by mouth daily.   Dulaglutide (TRULICITY) 9.41 DE/0.8XK SOPN Inject 0.75 mg into the skin once a week.   furosemide (LASIX) 40 MG tablet Take 1 tablet (40 mg total) by mouth daily.   losartan (COZAAR) 100 MG tablet TAKE 1 TABLET BY MOUTH EVERY DAY   nitroGLYCERIN (NITROSTAT) 0.4 MG SL tablet Place 0.4 mg under the tongue every 5 (five) minutes as needed for chest pain.   omeprazole (PRILOSEC) 40 MG capsule TAKE 1 CAPSULE BY MOUTH EVERY DAY  rosuvastatin (CRESTOR) 40 MG tablet Take 1 tablet (40 mg total) by mouth daily.    Allergies: Patient has no known allergies.  Social History   Tobacco Use   Smoking status: Former    Packs/day:  1.00    Years: 15.00    Pack years: 15.00    Types: Cigarettes    Quit date: 1995    Years since quitting: 27.7   Smokeless tobacco: Never  Vaping Use   Vaping Use: Never used  Substance Use Topics   Alcohol use: No   Drug use: No    Family History  Problem Relation Age of Onset   Hypertension Mother    Heart disease Mother        pacemaker   Hypertension Father    Stroke Father    Heart disease Father 18   Heart attack Father    Heart disease Sister    Heart attack Sister 40   Heart failure Sister    Colon cancer Neg Hx    Esophageal cancer Neg Hx    Liver cancer Neg Hx    Pancreatic cancer Neg Hx    Stomach cancer Neg Hx    Rectal cancer Neg Hx     Review of Systems: A 12-system review of systems was performed and was negative except as noted in the HPI.  --------------------------------------------------------------------------------------------------  Physical Exam: BP 120/76 (BP Location: Left Arm, Patient Position: Sitting, Cuff Size: Large)   Pulse 70   Ht 5\' 9"  (1.753 m)   Wt (!) 327 lb (148.3 kg)   SpO2 95%   BMI 48.29 kg/m   General:  NAD. Neck: Unable to assess JVP due to body habitus. Lungs: Clear to auscultation bilaterally without wheezes or crackles. Heart: Regular rate and rhythm without murmurs, rubs, or gallops. Abdomen: Soft, nontender, nondistended. Extremities: Trace pretibial edema bilaterally.  EKG: Normal sinus rhythm without abnormality.  Lab Results  Component Value Date   WBC 5.8 01/24/2021   HGB 14.1 01/24/2021   HCT 40.8 01/24/2021   MCV 99 (H) 01/24/2021   PLT 290 01/24/2021    Lab Results  Component Value Date   NA 143 03/19/2021   K 3.6 03/19/2021   CL 103 03/19/2021   CO2 26 03/19/2021   BUN 11 03/19/2021   CREATININE 0.96 03/19/2021   GLUCOSE 113 (H) 03/19/2021   ALT 34 01/31/2021    Lab Results  Component Value Date   CHOL 144 01/31/2021   HDL 31.00 (L) 01/31/2021   [address] 95 01/31/2021   TRIG 89.0  01/31/2021   CHOLHDL 5 01/31/2021    --------------------------------------------------------------------------------------------------  ASSESSMENT AND PLAN: Coronary artery disease: Symptoms are stable to improved from our prior visit without significant angina reported.  Given moderate nonobstructive CAD, we will continue with medical therapy to prevent progression of disease.  However, in the setting of recent joint pains, we have agreed to a 1 month statin holiday.  Chronic HFpEF: Exertional dyspnea is stable with slight improvement in edema after addition of furosemide.  Labs after addition of furosemide showed stable renal function and potassium.  We will plan to continue furosemide 40 mg daily as well as blood pressure control with losartan 100 mg daily.  Hypertension: Blood pressure at goal today.  Continue losartan and furosemide.  Hyperlipidemia: LDL mildly elevated on last check in July.  Given recent joint pains, we have agreed to a statin holiday.  I have asked Todd Owens to contact us in about a month to report  on any improvement.  If his joint pains have gotten better, we will need to consider switching to an alternative statin.  If he has not experienced any improvement, we will resume rosuvastatin 40 mg daily.  Morbid obesity: BMI remains greater than 40.  Weight loss encouraged through diet and exercise.  Follow-up: Return to clinic in 3 months.  Nelva Bush, MD 04/25/2021 7:49 AM

## 2021-04-25 ENCOUNTER — Encounter: Payer: Self-pay | Admitting: Internal Medicine

## 2021-04-25 ENCOUNTER — Other Ambulatory Visit: Payer: Self-pay

## 2021-04-25 ENCOUNTER — Other Ambulatory Visit (INDEPENDENT_AMBULATORY_CARE_PROVIDER_SITE_OTHER): Payer: BC Managed Care – PPO

## 2021-04-25 ENCOUNTER — Other Ambulatory Visit: Payer: BC Managed Care – PPO

## 2021-04-25 DIAGNOSIS — R7989 Other specified abnormal findings of blood chemistry: Secondary | ICD-10-CM

## 2021-04-25 DIAGNOSIS — R7303 Prediabetes: Secondary | ICD-10-CM

## 2021-04-25 DIAGNOSIS — E785 Hyperlipidemia, unspecified: Secondary | ICD-10-CM | POA: Diagnosis not present

## 2021-04-25 LAB — LIPID PANEL
Cholesterol: 104 mg/dL (ref 0–200)
HDL: 27.6 mg/dL — ABNORMAL LOW (ref >39.00)
LDL Cholesterol: 61 mg/dL (ref 0–99)
NonHDL: 76.75
Total CHOL/HDL Ratio: 4
Triglycerides: 81 mg/dL (ref 0.0–149.0)
VLDL: 16.2 mg/dL (ref 0.0–40.0)

## 2021-04-25 LAB — COMPREHENSIVE METABOLIC PANEL
ALT: 19 U/L (ref 0–53)
AST: 22 U/L (ref 0–37)
Albumin: 3.9 g/dL (ref 3.5–5.2)
Alkaline Phosphatase: 57 U/L (ref 39–117)
BUN: 14 mg/dL (ref 6–23)
CO2: 31 mEq/L (ref 19–32)
Calcium: 8.9 mg/dL (ref 8.4–10.5)
Chloride: 102 mEq/L (ref 96–112)
Creatinine, Ser: 0.92 mg/dL (ref 0.40–1.50)
GFR: 93.89 mL/min (ref >60.00)
Glucose, Bld: 112 mg/dL — ABNORMAL HIGH (ref 70–99)
Potassium: 4 mEq/L (ref 3.5–5.1)
Sodium: 138 mEq/L (ref 135–145)
Total Bilirubin: 0.6 mg/dL (ref 0.2–1.2)
Total Protein: 6.7 g/dL (ref 6.0–8.3)

## 2021-04-25 LAB — T3, FREE: T3, Free: 3.1 pg/mL (ref 2.3–4.2)

## 2021-04-25 LAB — T4, FREE: Free T4: 0.63 ng/dL (ref 0.60–1.60)

## 2021-04-25 LAB — TSH: TSH: 4.32 u[IU]/mL (ref 0.35–5.50)

## 2021-04-25 LAB — HEMOGLOBIN A1C: Hgb A1c MFr Bld: 5.9 % (ref 4.6–6.5)

## 2021-04-25 NOTE — Progress Notes (Signed)
No critical labs need to be addressed urgently. We will discuss labs in detail at upcoming office visit.   

## 2021-05-05 ENCOUNTER — Other Ambulatory Visit: Payer: Self-pay | Admitting: Internal Medicine

## 2021-05-23 ENCOUNTER — Telehealth: Payer: Self-pay | Admitting: Pulmonary Disease

## 2021-05-23 NOTE — Telephone Encounter (Signed)
RA please advise if ok to print this off for the pt to come by the office and pick this up.  Thanks.

## 2021-05-26 NOTE — Telephone Encounter (Signed)
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 Advised him I will have to call [employer] and see if they can update his information. Called and spoke with Terri and she said they would get it updated. Refreshed [employer] and it has been updated. Report printed for 90 days and patient has been notified it's upfront to be picked up. He expressed understanding. Nothing further needed at this time.

## 2021-05-27 ENCOUNTER — Other Ambulatory Visit: Payer: Self-pay

## 2021-05-27 ENCOUNTER — Ambulatory Visit: Payer: BC Managed Care – PPO | Admitting: Family Medicine

## 2021-05-27 ENCOUNTER — Encounter: Payer: Self-pay | Admitting: Family Medicine

## 2021-05-27 VITALS — BP 110/70 | HR 64 | Temp 98.1°F | Ht 69.0 in | Wt 330.4 lb

## 2021-05-27 DIAGNOSIS — I10 Essential (primary) hypertension: Secondary | ICD-10-CM | POA: Diagnosis not present

## 2021-05-27 DIAGNOSIS — E785 Hyperlipidemia, unspecified: Secondary | ICD-10-CM

## 2021-05-27 DIAGNOSIS — R7989 Other specified abnormal findings of blood chemistry: Secondary | ICD-10-CM | POA: Diagnosis not present

## 2021-05-27 DIAGNOSIS — R7303 Prediabetes: Secondary | ICD-10-CM

## 2021-05-27 MED ORDER — TRULICITY 1.5 MG/0.5ML ~~LOC~~ SOAJ: 1.5000 mg | SUBCUTANEOUS | 11 refills | Status: DC

## 2021-05-27 NOTE — Patient Instructions (Addendum)
Increase Trulcity to 1.5 mg weekly.  Can try CoEnzyme Q 10 along with the Crestor to see if it mitigates/stop side effects.. If not let cardiology know.

## 2021-05-27 NOTE — Progress Notes (Signed)
Patient ID: Todd Owens, male    DOB: 17-Jun-1966, 55 y.o.   MRN: [id number]  This visit was conducted in person.  BP 110/70   Pulse 64   Temp 98.1 F (36.7 C) (Temporal)   Ht 5\' 9"  (1.753 m)   Wt (!) 330 lb 6 oz (149.9 kg)   SpO2 96%   BMI 48.79 kg/m    CC: Chief Complaint  Patient presents with   Follow-up    Trulicity    Subjective:   HPI: Todd Owens is a 55 y.o. male presenting on 05/27/2021 for Follow-up (Trulicity)  Started on Trulicity on 3/32/9518 for prediabetes and weight management.  He has not noted weight change, he is trying to eat less, getting full sooner.  NO SE.  Wt Readings from Last 3 Encounters:  05/27/21 (!) 330 lb 6 oz (149.9 kg)  04/24/21 (!) 327 lb (148.3 kg)  03/12/21 (!) 330 lb 2 oz (149.7 kg)    Re-val of TSH.. 8.3 down to 4.32  Free t4 0.63 Free t3 3.1  Hypertension:   At goal on losartan and maxide. BP Readings from Last 3 Encounters:  05/27/21 110/70  04/24/21 120/76  03/12/21 120/70  Using medication without problems or lightheadedness:  Chest pain with exertion: Edema: Short of breath: Average home BPs: Other issues:   Elevated Cholesterol: Started by cardiologist on crestor 40 mg daily... stopped given knee pain... has been off x 30 days.. improved knee pain.   HX of CAD Lab Results  Component Value Date   CHOL 104 04/25/2021   HDL 27.60 (L) 04/25/2021   LDLCALC 61 04/25/2021   TRIG 81.0 04/25/2021   CHOLHDL 4 04/25/2021  Using medications without problems: Muscle aches:  Diet compliance: decreased meal size Exercise: walking some, but having knee pain. Other complaints:      Relevant past medical, surgical, family and social history reviewed and updated as indicated. Interim medical history since our last visit reviewed. Allergies and medications reviewed and updated. Outpatient Medications Prior to Visit  Medication Sig Dispense Refill   aspirin (ASPIRIN LOW DOSE) 81 MG EC tablet Take 1 tablet (81 mg  total) by mouth daily. 90 tablet 3   Dulaglutide (TRULICITY) 8.41 YS/0.6TK SOPN Inject 0.75 mg into the skin once a week. 2 mL 11   furosemide (LASIX) 40 MG tablet Take 1 tablet (40 mg total) by mouth daily. 30 tablet 5   losartan (COZAAR) 100 MG tablet TAKE 1 TABLET BY MOUTH EVERY DAY 90 tablet 0   nitroGLYCERIN (NITROSTAT) 0.4 MG SL tablet Place 0.4 mg under the tongue every 5 (five) minutes as needed for chest pain.     omeprazole (PRILOSEC) 40 MG capsule TAKE 1 CAPSULE BY MOUTH EVERY DAY 90 capsule 3   rosuvastatin (CRESTOR) 40 MG tablet TAKE 1 TABLET BY MOUTH EVERY DAY (Patient not taking: Reported on 05/27/2021) 90 tablet 1   No facility-administered medications prior to visit.     Per HPI unless specifically indicated in ROS section below Review of Systems  Constitutional:  Negative for fatigue and fever.  HENT:  Negative for ear pain.   Eyes:  Negative for pain.  Respiratory:  Negative for cough and shortness of breath.   Cardiovascular:  Negative for chest pain, palpitations and leg swelling.  Gastrointestinal:  Negative for abdominal pain.  Genitourinary:  Negative for dysuria.  Musculoskeletal:  Negative for arthralgias.  Neurological:  Negative for syncope, light-headedness and headaches.  Psychiatric/Behavioral:  Negative for  dysphoric mood.   Objective:  BP 110/70   Pulse 64   Temp 98.1 F (36.7 C) (Temporal)   Ht 5\' 9"  (1.753 m)   Wt (!) 330 lb 6 oz (149.9 kg)   SpO2 96%   BMI 48.79 kg/m   Wt Readings from Last 3 Encounters:  05/27/21 (!) 330 lb 6 oz (149.9 kg)  04/24/21 (!) 327 lb (148.3 kg)  03/12/21 (!) 330 lb 2 oz (149.7 kg)      Physical Exam Constitutional:      Appearance: He is well-developed. He is obese.  HENT:     Head: Normocephalic.     Right Ear: Hearing normal.     Left Ear: Hearing normal.     Nose: Nose normal.  Neck:     Thyroid: No thyroid mass or thyromegaly.     Vascular: No carotid bruit.     Trachea: Trachea normal.   Cardiovascular:     Rate and Rhythm: Normal rate and regular rhythm.     Pulses: Normal pulses.     Heart sounds: Heart sounds not distant. No murmur heard.   No friction rub. No gallop.     Comments: No peripheral edema Pulmonary:     Effort: Pulmonary effort is normal. No respiratory distress.     Breath sounds: Normal breath sounds.  Skin:    General: Skin is warm and dry.     Findings: No rash.  Psychiatric:        Speech: Speech normal.        Behavior: Behavior normal.        Thought Content: Thought content normal.      Results for orders placed or performed in visit on 04/25/21  T3, free  Result Value Ref Range   T3, Free 3.1 2.3 - 4.2 pg/mL  T4, free  Result Value Ref Range   Free T4 0.63 0.60 - 1.60 ng/dL  TSH  Result Value Ref Range   TSH 4.32 0.35 - 5.50 uIU/mL  Comprehensive metabolic panel  Result Value Ref Range   Sodium 138 135 - 145 mEq/L   Potassium 4.0 3.5 - 5.1 mEq/L   Chloride 102 96 - 112 mEq/L   CO2 31 19 - 32 mEq/L   Glucose, Bld 112 (H) 70 - 99 mg/dL   BUN 14 6 - 23 mg/dL   Creatinine, Ser 0.92 0.40 - 1.50 mg/dL   Total Bilirubin 0.6 0.2 - 1.2 mg/dL   Alkaline Phosphatase 57 39 - 117 U/L   AST 22 0 - 37 U/L   ALT 19 0 - 53 U/L   Total Protein 6.7 6.0 - 8.3 g/dL   Albumin 3.9 3.5 - 5.2 g/dL   GFR 93.89 >60.00 mL/min   Calcium 8.9 8.4 - 10.5 mg/dL  Lipid panel  Result Value Ref Range   Cholesterol 104 0 - 200 mg/dL   Triglycerides 81.0 0.0 - 149.0 mg/dL   HDL 27.60 (L) >39.00 mg/dL   VLDL 16.2 0.0 - 40.0 mg/dL   LDL Cholesterol 61 0 - 99 mg/dL   Total CHOL/HDL Ratio 4    NonHDL 76.75   Hemoglobin A1c  Result Value Ref Range   Hgb A1c MFr Bld 5.9 4.6 - 6.5 %    This visit occurred during the SARS-CoV-2 public health emergency.  Safety protocols were in place, including screening questions prior to the visit, additional usage of staff PPE, and extensive cleaning of exam room while observing appropriate contact time as indicated  for  disinfecting solutions.   COVID 19 screen:  No recent travel or known exposure to COVID19 The patient denies respiratory symptoms of COVID 19 at this time. The importance of social distancing was discussed today.   Assessment and Plan    Problem List Items Addressed This Visit     Elevated TSH   Essential hypertension, benign    Stable, chronic.  Continue current medication.   Losartan and HCTZ      Hyperlipidemia    Inadequate control. Can try CoEnzyme Q 10 along with the Crestor to see if it mitigates/stop side effects.. If not let cardiology know.      Morbid obesity ([place])    Encouraged exercise, weight loss, healthy eating habits. Started on Trulicity on 4/46/9507 for prediabetes and weight management.  He has not noted weight change, he is trying to eat less, getting full sooner.  NO SE.  Increase Trulcity to 1.5 mg weekly.      Relevant Medications   Dulaglutide (TRULICITY) 1.5 KU/5.7DY SOPN   Prediabetes - Primary    Meds ordered this encounter  Medications   Dulaglutide (TRULICITY) 1.5 NX/8.3FP SOPN    Sig: Inject 1.5 mg into the skin once a week.    Dispense:  3 mL    Refill:  11     Eliezer Lofts, MD

## 2021-06-26 ENCOUNTER — Other Ambulatory Visit: Payer: Self-pay | Admitting: *Deleted

## 2021-06-26 MED ORDER — DICLOFENAC SODIUM 1 % EX GEL: CUTANEOUS | 2 refills | Status: DC

## 2021-06-26 NOTE — Telephone Encounter (Signed)
Todd Owens was here today for appointment.  She ask that we send in refill for cream that Todd Owens uses on his knees.  Diclofenac gel is in history but not on current medication list?

## 2021-07-07 ENCOUNTER — Other Ambulatory Visit: Payer: Self-pay

## 2021-07-07 ENCOUNTER — Encounter: Payer: Self-pay | Admitting: Family

## 2021-07-07 ENCOUNTER — Other Ambulatory Visit (HOSPITAL_COMMUNITY)
Admission: RE | Admit: 2021-07-07 | Discharge: 2021-07-07 | Disposition: A | Discharge status: Home / Self Care | Payer: BC Managed Care – PPO | Source: Ambulatory Visit | Attending: Family | Admitting: Family

## 2021-07-07 ENCOUNTER — Ambulatory Visit (INDEPENDENT_AMBULATORY_CARE_PROVIDER_SITE_OTHER): Payer: BC Managed Care – PPO | Admitting: Family

## 2021-07-07 VITALS — BP 140/82 | HR 88 | Temp 99.9°F | Ht 69.0 in | Wt 330.4 lb

## 2021-07-07 DIAGNOSIS — R7989 Other specified abnormal findings of blood chemistry: Secondary | ICD-10-CM | POA: Insufficient documentation

## 2021-07-07 DIAGNOSIS — R300 Dysuria: Secondary | ICD-10-CM

## 2021-07-07 DIAGNOSIS — N3090 Cystitis, unspecified without hematuria: Secondary | ICD-10-CM | POA: Diagnosis not present

## 2021-07-07 LAB — POCT URINALYSIS DIPSTICK
Blood, UA: NEGATIVE
Glucose, UA: NEGATIVE
Ketones, UA: NEGATIVE
Nitrite, UA: NEGATIVE
Protein, UA: POSITIVE — AB
Spec Grav, UA: 1.015 (ref 1.010–1.025)
Urobilinogen, UA: 2 E.U./dL — AB
pH, UA: 7 (ref 5.0–8.0)

## 2021-07-07 MED ORDER — CIPROFLOXACIN HCL 500 MG PO TABS
500.0000 mg | ORAL_TABLET | Freq: Two times a day (BID) | ORAL | 0 refills | Status: AC
Start: 2021-07-07 — End: 2021-07-14

## 2021-07-07 NOTE — Assessment & Plan Note (Signed)
Stable, chronic.  Continue current medication.   Losartan and HCTZ 

## 2021-07-07 NOTE — Patient Instructions (Signed)
It was very nice to see you today!  I have sent the antibiotic to your pharmacy, Start this today. Remember to drink at least 2 liters of water daily. Call the office back if you are not feeling better in a few days.  PLEASE NOTE:  If you had any lab tests please let us know if you have not heard back within a few days. You may see your results on MyChart before we have a chance to review them but we will give you a call once they are reviewed by [place]. If we ordered any referrals today, please let us know if you have not heard from their office within the next week.   Please try these tips to maintain a healthy lifestyle:  Eat most of your calories during the day when you are active. Eliminate processed foods including packaged sweets (pies, cakes, cookies), reduce intake of potatoes, white bread, white pasta, and white rice. Look for whole grain options, oat flour or almond flour.  Each meal should contain half fruits/vegetables, one quarter protein, and one quarter carbs (no bigger than a computer mouse).  Cut down on sweet beverages. This includes juice, soda, and sweet tea. Also watch fruit intake, though this is a healthier sweet option, it still contains natural sugar! Limit to 3 servings daily.  Drink at least 1 glass of water with each meal and aim for at least 8 glasses per day  Exercise at least 150 minutes every week.

## 2021-07-07 NOTE — Assessment & Plan Note (Addendum)
Encouraged exercise, weight loss, healthy eating habits. Started on Trulicity on 07/30/5518 for prediabetes and weight management.  He has not noted weight change, he is trying to eat less, getting full sooner.  NO SE.  Increase Trulcity to 1.5 mg weekly.

## 2021-07-07 NOTE — Progress Notes (Signed)
Subjective:     Patient ID: Todd Owens, male    DOB: 27-Feb-1966, 55 y.o.   MRN: [id number]  Chief Complaint  Patient presents with   Dysuria    Symptoms started Saturday. He denies dizziness, nausea, and vomiting.   Fatigue   Back Pain   Fever    Low grade 99.9 in office.   Abdominal Pain    HPI: Urinary symptoms: Patient c/o  dysuria, frequency, urgency, low abd pain, low back pain, foul odor, cloudy urine. Other sx: denies hematuria, rectal pain, or flank pain. Duration of sx: 2 days; Home tx: none; Denies nausea, low grade fever today, none at home. Reports last UTI never.  Denies any new meds.    Health Maintenance Due  Topic Date Due   COVID-19 Vaccine (1) Never done   Pneumococcal Vaccine 14-54 Years old (1 - PCV) Never done   Zoster Vaccines- Shingrix (1 of 2) Never done    Past Medical History:  Diagnosis Date   Acute sinusitis, unspecified    Dermatophytosis of nail    Esophageal reflux    Essential hypertension, benign    Impotence of organic origin    Morbid obesity ([place])    Obstructive sleep apnea (adult) (pediatric)    Other and unspecified hyperlipidemia    Other malaise and fatigue    Other testicular hypofunction    Pure hypercholesterolemia    Rosacea    Routine general medical examination at a health care facility    Sleep apnea     Past Surgical History:  Procedure Laterality Date   BACK SURGERY  07/20/2001   CARDIAC CATHETERIZATION     cardiolyte  07/21/1999   neg   INTRAVASCULAR PRESSURE WIRE/FFR STUDY N/A 02/21/2021   Procedure: INTRAVASCULAR PRESSURE WIRE/FFR STUDY;  Surgeon: Nelva Bush, MD;  Location: [facility];  Service: Cardiovascular;  Laterality: N/A;   KNEE SURGERY  07/21/2003   LEFT HEART CATH AND CORONARY ANGIOGRAPHY N/A 02/21/2021   Procedure: LEFT HEART CATH AND CORONARY ANGIOGRAPHY;  Surgeon: Nelva Bush, MD;  Location: [facility];  Service: Cardiovascular;  Laterality: N/A;   PFT  07/20/2005    WNL   WRIST SURGERY Right     Outpatient Medications Prior to Visit  Medication Sig Dispense Refill   aspirin (ASPIRIN LOW DOSE) 81 MG EC tablet Take 1 tablet (81 mg total) by mouth daily. 90 tablet 3   diclofenac Sodium (VOLTAREN) 1 % GEL APPLY 4 GRAMS TOPICALLY 4 TIMES DAILY**ASPIRIN & NSAIDS ORALLY NOT RECOMMENDED GIVEN CARDIAC ISSUES 100 g 2   Dulaglutide (TRULICITY) 1.5 WE/3.1VQ SOPN Inject 1.5 mg into the skin once a week. 3 mL 11   furosemide (LASIX) 40 MG tablet Take 1 tablet (40 mg total) by mouth daily. 30 tablet 5   losartan (COZAAR) 100 MG tablet TAKE 1 TABLET BY MOUTH EVERY DAY 90 tablet 0   nitroGLYCERIN (NITROSTAT) 0.4 MG SL tablet Place 0.4 mg under the tongue every 5 (five) minutes as needed for chest pain.     omeprazole (PRILOSEC) 40 MG capsule TAKE 1 CAPSULE BY MOUTH EVERY DAY 90 capsule 3   rosuvastatin (CRESTOR) 40 MG tablet TAKE 1 TABLET BY MOUTH EVERY DAY (Patient not taking: Reported on 05/27/2021) 90 tablet 1   No facility-administered medications prior to visit.    No Known Allergies      Objective:    Physical Exam Vitals and nursing note reviewed.  Constitutional:      General:  He is not in acute distress.    Appearance: Normal appearance. He is obese.  HENT:     Head: Normocephalic.  Cardiovascular:     Rate and Rhythm: Normal rate and regular rhythm.  Pulmonary:     Effort: Pulmonary effort is normal.     Breath sounds: Normal breath sounds.  Musculoskeletal:        General: Normal range of motion.     Cervical back: Normal range of motion.  Skin:    General: Skin is warm and dry.  Neurological:     Mental Status: He is alert and oriented to person, place, and time.  Psychiatric:        Mood and Affect: Mood normal.    BP 140/82    Pulse 88    Temp 99.9 F (37.7 C) (Temporal)    Ht 5\' 9"  (1.753 m)    Wt (!) 330 lb 6.4 oz (149.9 kg)    SpO2 95%    BMI 48.79 kg/m  Wt Readings from Last 3 Encounters:  07/07/21 (!) 330 lb 6.4 oz (149.9 kg)   05/27/21 (!) 330 lb 6 oz (149.9 kg)  04/24/21 (!) 327 lb (148.3 kg)       Assessment & Plan:   Problem List Items Addressed This Visit       Genitourinary   Cystitis    Denies constipation or rectal pain, hx of kidney stones, reports dark urine, foul odor, dysuria, low back pain, denies risk of STD, and denies knife-like pain. Mild infection, sending Cipro, advised on use & SE and to call back if sx are not improving.       Relevant Medications   ciprofloxacin (CIPRO) 500 MG tablet     Other   Dysuria - Primary   Relevant Orders   POCT Urinalysis Dipstick (Completed)   Urine Culture   Urine cytology ancillary only    Meds ordered this encounter  Medications   ciprofloxacin (CIPRO) 500 MG tablet    Sig: Take 1 tablet (500 mg total) by mouth 2 (two) times daily for 7 days.    Dispense:  14 tablet    Refill:  0    Order Specific Question:   Supervising Provider    Answer:   ANDY, CAMILLE L [[id number]]

## 2021-07-07 NOTE — Assessment & Plan Note (Signed)
Inadequate control. Can try CoEnzyme Q 10 along with the Crestor to see if it mitigates/stop side effects.. If not let cardiology know.

## 2021-07-07 NOTE — Assessment & Plan Note (Signed)
Denies constipation or rectal pain, hx of kidney stones, reports dark urine, foul odor, dysuria, low back pain, denies risk of STD, and denies knife-like pain. Mild infection, sending Cipro, advised on use & SE and to call back if sx are not improving.

## 2021-07-09 LAB — URINE CULTURE
MICRO NUMBER:: 12774290
SPECIMEN QUALITY:: ADEQUATE

## 2021-07-10 LAB — URINE CYTOLOGY ANCILLARY ONLY
Bacterial Vaginitis-Urine: NEGATIVE
Candida Urine: NEGATIVE
Chlamydia: NEGATIVE
Comment: NEGATIVE
Comment: NEGATIVE
Comment: NORMAL
Neisseria Gonorrhea: NEGATIVE
Trichomonas: NEGATIVE

## 2021-07-16 ENCOUNTER — Ambulatory Visit: Payer: BC Managed Care – PPO | Admitting: Internal Medicine

## 2021-07-19 ENCOUNTER — Other Ambulatory Visit: Payer: Self-pay | Admitting: Family Medicine

## 2021-07-29 ENCOUNTER — Other Ambulatory Visit: Payer: Self-pay

## 2021-07-29 ENCOUNTER — Ambulatory Visit (INDEPENDENT_AMBULATORY_CARE_PROVIDER_SITE_OTHER): Payer: BC Managed Care – PPO | Admitting: Family Medicine

## 2021-07-29 ENCOUNTER — Encounter: Payer: Self-pay | Admitting: Family Medicine

## 2021-07-29 NOTE — Patient Instructions (Addendum)
Continue Trulicity  at 1.5 mg weekly.  Work on increasing indoor activity, stop daily pepsi and finding an alternative to potato.

## 2021-07-29 NOTE — Progress Notes (Signed)
Patient ID: Todd Owens, male    DOB: 02/04/1966, 56 y.o.   MRN: [id number]  This visit was conducted in person.  BP 112/72    Pulse 60    Temp 98.4 F (36.9 C) (Temporal)    Ht 5\' 9"  (1.753 m)    Wt (!) 331 lb 2 oz (150.2 kg)    SpO2 95%    BMI 48.90 kg/m    CC: Chief Complaint  Patient presents with   Follow-up    Weight Management    Subjective:   HPI: Todd Owens is a 56 y.o. male presenting on 07/29/2021 for Follow-up (Weight Management)  Morbid obesity: Started on Trulicity on 8/36/6294 for prediabetes and weight management. At last  OV on 76/5/46 Trulicity was increased to 1.5 mg weekly   NO SE. He has noted  decreased appetite. He decreased portion size, trying to make healthy habits.  Choosing broiled fish.  He feels he is losing weight at the midsection, pants are loose.   Not working as much and not outside as much.   Wt Readings from Last 3 Encounters:  07/29/21 (!) 331 lb 2 oz (150.2 kg)  07/07/21 (!) 330 lb 6.4 oz (149.9 kg)  05/27/21 (!) 330 lb 6 oz (149.9 kg)        Relevant past medical, surgical, family and social history reviewed and updated as indicated. Interim medical history since our last visit reviewed. Allergies and medications reviewed and updated. Outpatient Medications Prior to Visit  Medication Sig Dispense Refill   aspirin (ASPIRIN LOW DOSE) 81 MG EC tablet Take 1 tablet (81 mg total) by mouth daily. 90 tablet 3   Coenzyme Q10 (COQ-10 PO) Take 1 capsule by mouth daily.     diclofenac Sodium (VOLTAREN) 1 % GEL APPLY 4 GRAMS TOPICALLY 4 TIMES DAILY**ASPIRIN & NSAIDS ORALLY NOT RECOMMENDED GIVEN CARDIAC ISSUES 100 g 2   Dulaglutide (TRULICITY) 1.5 TK/3.5WS SOPN Inject 1.5 mg into the skin once a week. 3 mL 11   furosemide (LASIX) 40 MG tablet Take 1 tablet (40 mg total) by mouth daily. 30 tablet 5   losartan (COZAAR) 100 MG tablet TAKE 1 TABLET BY MOUTH EVERY DAY 90 tablet 1   nitroGLYCERIN (NITROSTAT) 0.4 MG SL tablet Place 0.4 mg  under the tongue every 5 (five) minutes as needed for chest pain.     omeprazole (PRILOSEC) 40 MG capsule TAKE 1 CAPSULE BY MOUTH EVERY DAY 90 capsule 3   rosuvastatin (CRESTOR) 40 MG tablet TAKE 1 TABLET BY MOUTH EVERY DAY 90 tablet 1   No facility-administered medications prior to visit.     Per HPI unless specifically indicated in ROS section below Review of Systems  Constitutional:  Negative for fatigue and fever.  HENT:  Negative for ear pain.   Eyes:  Negative for pain.  Respiratory:  Negative for cough and shortness of breath.   Cardiovascular:  Negative for chest pain, palpitations and leg swelling.  Gastrointestinal:  Negative for abdominal pain.  Genitourinary:  Negative for dysuria.  Musculoskeletal:  Negative for arthralgias.  Neurological:  Negative for syncope, light-headedness and headaches.  Psychiatric/Behavioral:  Negative for dysphoric mood.   Objective:  BP 112/72    Pulse 60    Temp 98.4 F (36.9 C) (Temporal)    Ht 5\' 9"  (1.753 m)    Wt (!) 331 lb 2 oz (150.2 kg)    SpO2 95%    BMI 48.90 kg/m   Wt Readings from  Last 3 Encounters:  07/29/21 (!) 331 lb 2 oz (150.2 kg)  07/07/21 (!) 330 lb 6.4 oz (149.9 kg)  05/27/21 (!) 330 lb 6 oz (149.9 kg)      Physical Exam Constitutional:      Appearance: He is well-developed. He is obese.  HENT:     Head: Normocephalic.     Right Ear: Hearing normal.     Left Ear: Hearing normal.     Nose: Nose normal.  Neck:     Thyroid: No thyroid mass or thyromegaly.     Vascular: No carotid bruit.     Trachea: Trachea normal.  Cardiovascular:     Rate and Rhythm: Normal rate and regular rhythm.     Pulses: Normal pulses.     Heart sounds: Heart sounds not distant. No murmur heard.   No friction rub. No gallop.     Comments: No peripheral edema Pulmonary:     Effort: Pulmonary effort is normal. No respiratory distress.     Breath sounds: Normal breath sounds.  Skin:    General: Skin is warm and dry.     Findings: No  rash.  Psychiatric:        Speech: Speech normal.        Behavior: Behavior normal.        Thought Content: Thought content normal.      Results for orders placed or performed in visit on 07/07/21  Urine Culture   Specimen: Urine  Result Value Ref Range   MICRO NUMBER: [id number]    SPECIMEN QUALITY: Adequate    Sample Source NOT GIVEN    STATUS: FINAL    ISOLATE 1: Escherichia coli (A)       Susceptibility   Escherichia coli - URINE CULTURE, REFLEX    AMOX/CLAVULANIC <=2 Sensitive     AMPICILLIN <=2 Sensitive     AMPICILLIN/SULBACTAM <=2 Sensitive     CEFAZOLIN* <=4 Not Reportable      * For infections other than uncomplicated UTI caused by E. coli, K. pneumoniae or P. mirabilis: Cefazolin is resistant if MIC > or = 8 mcg/mL. (Distinguishing susceptible versus intermediate for isolates with MIC < or = 4 mcg/mL requires additional testing.) For uncomplicated UTI caused by E. coli, K. pneumoniae or P. mirabilis: Cefazolin is susceptible if MIC <32 mcg/mL and predicts susceptible to the oral agents cefaclor, cefdinir, cefpodoxime, cefprozil, cefuroxime, cephalexin and loracarbef.     CEFTAZIDIME <=1 Sensitive     CEFEPIME <=1 Sensitive     CEFTRIAXONE <=1 Sensitive     CIPROFLOXACIN <=0.25 Sensitive     LEVOFLOXACIN <=0.12 Sensitive     GENTAMICIN <=1 Sensitive     IMIPENEM <=0.25 Sensitive     NITROFURANTOIN <=16 Sensitive     PIP/TAZO <=4 Sensitive     TOBRAMYCIN <=1 Sensitive     TRIMETH/SULFA* <=20 Sensitive      * For infections other than uncomplicated UTI caused by E. coli, K. pneumoniae or P. mirabilis: Cefazolin is resistant if MIC > or = 8 mcg/mL. (Distinguishing susceptible versus intermediate for isolates with MIC < or = 4 mcg/mL requires additional testing.) For uncomplicated UTI caused by E. coli, K. pneumoniae or P. mirabilis: Cefazolin is susceptible if MIC <32 mcg/mL and predicts susceptible to the oral agents cefaclor, cefdinir, cefpodoxime,  cefprozil, cefuroxime, cephalexin and loracarbef. Legend: S = Susceptible  I = Intermediate R = Resistant  NS = Not susceptible * = Not tested  NR = Not reported **NN =  See antimicrobic comments   POCT Urinalysis Dipstick  Result Value Ref Range   Color, UA Dark Yellow    Clarity, UA Clear    Glucose, UA Negative Negative   Bilirubin, UA Small    Ketones, UA Negative    Spec Grav, UA 1.015 1.010 - 1.025   Blood, UA Negative    pH, UA 7.0 5.0 - 8.0   Protein, UA Positive (A) Negative   Urobilinogen, UA 2.0 (A) 0.2 or 1.0 E.U./dL   Nitrite, UA Negative    Leukocytes, UA Small (1+) (A) Negative   Appearance     Odor    Urine cytology ancillary only  Result Value Ref Range   Neisseria Gonorrhea Negative    Chlamydia Negative    Trichomonas Negative    Bacterial Vaginitis-Urine Negative    Candida Urine Negative    Molecular Comment      This specimen does not meet the strict criteria set by the FDA. The   Molecular Comment      result interpretation should be considered in conjunction with the   Molecular Comment patient's clinical history.    Comment Normal Reference Range Trichomonas - Negative    Comment Normal Reference [place] Chlamydia - Negative    Comment      Normal Reference Range Neisseria Gonorrhea - Negative    This visit occurred during the SARS-CoV-2 public health emergency.  Safety protocols were in place, including screening questions prior to the visit, additional usage of staff PPE, and extensive cleaning of exam room while observing appropriate contact time as indicated for disinfecting solutions.   COVID 19 screen:  No recent travel or known exposure to COVID19 The patient denies respiratory symptoms of COVID 19 at this time. The importance of social distancing was discussed today.   Assessment and Plan    Problem List Items Addressed This Visit     Morbid obesity ([place]) - Primary    Reviewed lifestyle changes  Including heart healthy diet and   increasing exercise. Continue Trulicity  at 1.5 mg weekly.  Work on increasing indoor activity, stop daily pepsi and finding an alternative to potato.      Re-eval in 2 months.  Eliezer Lofts, MD

## 2021-08-12 ENCOUNTER — Other Ambulatory Visit: Payer: Self-pay

## 2021-08-12 ENCOUNTER — Ambulatory Visit: Payer: BC Managed Care – PPO | Admitting: Family Medicine

## 2021-08-12 ENCOUNTER — Encounter: Payer: Self-pay | Admitting: Family Medicine

## 2021-08-12 VITALS — BP 110/70 | HR 71 | Temp 98.1°F | Ht 69.0 in | Wt 336.4 lb

## 2021-08-12 DIAGNOSIS — M549 Dorsalgia, unspecified: Secondary | ICD-10-CM | POA: Diagnosis not present

## 2021-08-12 LAB — LIPASE: Lipase: 18 U/L (ref 11.0–59.0)

## 2021-08-12 MED ORDER — CYCLOBENZAPRINE HCL 10 MG PO TABS: 10.0000 mg | ORAL_TABLET | Freq: Every evening | ORAL | 0 refills | Status: DC | PRN

## 2021-08-12 NOTE — Patient Instructions (Addendum)
Please stop at the lab to have labs drawn.  Start heat on mid back start home physical therapy.  Could use lidocaine patch over the counter.  Start muscle relaxant at night.  Call if not improving or if new symptoms.

## 2021-08-12 NOTE — Assessment & Plan Note (Addendum)
Description sounds mostly musculoskeletal as triggered with movement.   No rash suggesting shingles.  He is on Trulicity .. so we will check a lipase to rule out pancreatitis.  Start heat, home PT and muscle relaxant. Consider prednisone if not improving as expected.

## 2021-08-12 NOTE — Progress Notes (Signed)
Patient ID: Todd Owens, male    DOB: [birth year], 56 y.o.   MRN: [id number]  This visit was conducted in person.  BP 110/70    Pulse 71    Temp 98.1 F (36.7 C) (Temporal)    Ht 5\' 9"  (1.753 m)    Wt (!) 336 lb 6 oz (152.6 kg)    SpO2 96%    BMI 49.67 kg/m    CC: Chief Complaint  Patient presents with   Back Pain    Upper Back has catch in it when moving a certain way    Subjective:   HPI: Todd Owens is a 56 y.o. male presenting on 08/12/2021 for Back Pain (Upper Back has catch in it when moving a certain way)   He has noted pain in upper back pain  x 2 week.. sudden onset when he was driving and turned head and upper body to the left.  Pain in central upper back .. across both sides.  Worse with  moving.   Cannot get comfortable, nothing makes it better.   No new activity and no falls.   He took some advil 800 mg.. no help. He has tried heat, does not help.   No neck pain, no radiation to arms.    He is on trulcity 1.5.Marland Kitchen not a new dose. No abdominal pain, but when pain hits.. he states it is sharp and can feel  shoot from back through entire body. Relevant past medical, surgical, family and social history reviewed and updated as indicated. Interim medical history since our last visit reviewed. Allergies and medications reviewed and updated. Outpatient Medications Prior to Visit  Medication Sig Dispense Refill   aspirin (ASPIRIN LOW DOSE) 81 MG EC tablet Take 1 tablet (81 mg total) by mouth daily. 90 tablet 3   Coenzyme Q10 (COQ-10 PO) Take 1 capsule by mouth daily.     diclofenac Sodium (VOLTAREN) 1 % GEL APPLY 4 GRAMS TOPICALLY 4 TIMES DAILY**ASPIRIN & NSAIDS ORALLY NOT RECOMMENDED GIVEN CARDIAC ISSUES 100 g 2   Dulaglutide (TRULICITY) 1.5 UT/6.5YY SOPN Inject 1.5 mg into the skin once a week. 3 mL 11   furosemide (LASIX) 40 MG tablet Take 1 tablet (40 mg total) by mouth daily. 30 tablet 5   losartan (COZAAR) 100 MG tablet TAKE 1 TABLET BY MOUTH EVERY DAY 90 tablet  1   nitroGLYCERIN (NITROSTAT) 0.4 MG SL tablet Place 0.4 mg under the tongue every 5 (five) minutes as needed for chest pain.     omeprazole (PRILOSEC) 40 MG capsule TAKE 1 CAPSULE BY MOUTH EVERY DAY 90 capsule 3   rosuvastatin (CRESTOR) 40 MG tablet TAKE 1 TABLET BY MOUTH EVERY DAY 90 tablet 1   No facility-administered medications prior to visit.     Per HPI unless specifically indicated in ROS section below Review of Systems  Constitutional:  Negative for fatigue and fever.  HENT:  Negative for ear pain.   Eyes:  Negative for pain.  Respiratory:  Negative for cough and shortness of breath.   Cardiovascular:  Negative for chest pain, palpitations and leg swelling.  Gastrointestinal:  Negative for abdominal pain.  Genitourinary:  Negative for dysuria.  Musculoskeletal:  Positive for back pain. Negative for arthralgias.  Neurological:  Negative for syncope, light-headedness and headaches.  Psychiatric/Behavioral:  Negative for dysphoric mood.   Objective:  BP 110/70    Pulse 71    Temp 98.1 F (36.7 C) (Temporal)    Ht 5'  9" (1.753 m)    Wt (!) 336 lb 6 oz (152.6 kg)    SpO2 96%    BMI 49.67 kg/m   Wt Readings from Last 3 Encounters:  08/12/21 (!) 336 lb 6 oz (152.6 kg)  07/29/21 (!) 331 lb 2 oz (150.2 kg)  07/07/21 (!) 330 lb 6.4 oz (149.9 kg)      Physical Exam Constitutional:      Appearance: He is well-developed.  HENT:     Head: Normocephalic.     Right Ear: Hearing normal.     Left Ear: Hearing normal.     Nose: Nose normal.  Neck:     Thyroid: No thyroid mass or thyromegaly.     Vascular: No carotid bruit.     Trachea: Trachea normal.  Cardiovascular:     Rate and Rhythm: Normal rate and regular rhythm.     Pulses: Normal pulses.     Heart sounds: Heart sounds not distant. No murmur heard.   No friction rub. No gallop.     Comments: No peripheral edema Pulmonary:     Effort: Pulmonary effort is normal. No respiratory distress.     Breath sounds: Normal breath  sounds.  Skin:    General: Skin is warm and dry.     Findings: No rash.  Psychiatric:        Speech: Speech normal.        Behavior: Behavior normal.        Thought Content: Thought content normal.      Results for orders placed or performed in visit on 07/07/21  Urine Culture   Specimen: Urine  Result Value Ref Range   MICRO NUMBER: [id number]    SPECIMEN QUALITY: Adequate    Sample Source NOT GIVEN    STATUS: FINAL    ISOLATE 1: Escherichia coli (A)       Susceptibility   Escherichia coli - URINE CULTURE, REFLEX    AMOX/CLAVULANIC <=2 Sensitive     AMPICILLIN <=2 Sensitive     AMPICILLIN/SULBACTAM <=2 Sensitive     CEFAZOLIN* <=4 Not Reportable      * For infections other than uncomplicated UTI caused by E. coli, K. pneumoniae or P. mirabilis: Cefazolin is resistant if MIC > or = 8 mcg/mL. (Distinguishing susceptible versus intermediate for isolates with MIC < or = 4 mcg/mL requires additional testing.) For uncomplicated UTI caused by E. coli, K. pneumoniae or P. mirabilis: Cefazolin is susceptible if MIC <32 mcg/mL and predicts susceptible to the oral agents cefaclor, cefdinir, cefpodoxime, cefprozil, cefuroxime, cephalexin and loracarbef.     CEFTAZIDIME <=1 Sensitive     CEFEPIME <=1 Sensitive     CEFTRIAXONE <=1 Sensitive     CIPROFLOXACIN <=0.25 Sensitive     LEVOFLOXACIN <=0.12 Sensitive     GENTAMICIN <=1 Sensitive     IMIPENEM <=0.25 Sensitive     NITROFURANTOIN <=16 Sensitive     PIP/TAZO <=4 Sensitive     TOBRAMYCIN <=1 Sensitive     TRIMETH/SULFA* <=20 Sensitive      * For infections other than uncomplicated UTI caused by E. coli, K. pneumoniae or P. mirabilis: Cefazolin is resistant if MIC > or = 8 mcg/mL. (Distinguishing susceptible versus intermediate for isolates with MIC < or = 4 mcg/mL requires additional testing.) For uncomplicated UTI caused by E. coli, K. pneumoniae or P. mirabilis: Cefazolin is susceptible if MIC <32 mcg/mL and  predicts susceptible to the oral agents cefaclor, cefdinir, cefpodoxime, cefprozil, cefuroxime, cephalexin and loracarbef.  Legend: S = Susceptible  I = Intermediate R = Resistant  NS = Not susceptible * = Not tested  NR = Not reported **NN = See antimicrobic comments   POCT Urinalysis Dipstick  Result Value Ref Range   Color, UA Dark Yellow    Clarity, UA Clear    Glucose, UA Negative Negative   Bilirubin, UA Small    Ketones, UA Negative    Spec Grav, UA 1.015 1.010 - 1.025   Blood, UA Negative    pH, UA 7.0 5.0 - 8.0   Protein, UA Positive (A) Negative   Urobilinogen, UA 2.0 (A) 0.2 or 1.0 E.U./dL   Nitrite, UA Negative    Leukocytes, UA Small (1+) (A) Negative   Appearance     Odor    Urine cytology ancillary only  Result Value Ref Range   Neisseria Gonorrhea Negative    Chlamydia Negative    Trichomonas Negative    Bacterial Vaginitis-Urine Negative    Candida Urine Negative    Molecular Comment      This specimen does not meet the strict criteria set by the FDA. The   Molecular Comment      result interpretation should be considered in conjunction with the   Molecular Comment patient's clinical history.    Comment Normal Reference Range Trichomonas - Negative    Comment Normal Reference Ranger Chlamydia - Negative    Comment      Normal Reference Range Neisseria Gonorrhea - Negative    This visit occurred during the SARS-CoV-2 public health emergency.  Safety protocols were in place, including screening questions prior to the visit, additional usage of staff PPE, and extensive cleaning of exam room while observing appropriate contact time as indicated for disinfecting solutions.   COVID 19 screen:  No recent travel or known exposure to COVID19 The patient denies respiratory symptoms of COVID 19 at this time. The importance of social distancing was discussed today.   Assessment and Plan    Problem List Items Addressed This Visit     Acute mid back pain -  Primary    Description sounds mostly musculoskeletal as triggered with movement.   No rash suggesting shingles.  He is on Trulicity .. so we will check a lipase to rule out pancreatitis.  Start heat, home PT and muscle relaxant. Consider prednisone if not improving as expected.      Relevant Medications   cyclobenzaprine (FLEXERIL) 10 MG tablet   Other Relevant Orders   Lipase   Meds ordered this encounter  Medications   cyclobenzaprine (FLEXERIL) 10 MG tablet    Sig: Take 1 tablet (10 mg total) by mouth at bedtime as needed for muscle spasms.    Dispense:  15 tablet    Refill:  0     Eliezer Lofts, MD

## 2021-08-19 NOTE — Assessment & Plan Note (Signed)
Reviewed lifestyle changes  Including heart healthy diet and  increasing exercise. Continue Trulicity  at 1.5 mg weekly.  Work on increasing indoor activity, stop daily pepsi and finding an alternative to potato.

## 2021-08-29 ENCOUNTER — Other Ambulatory Visit: Payer: Self-pay | Admitting: Family Medicine

## 2021-09-02 NOTE — Telephone Encounter (Signed)
Refill request Cyclobenzaprine Last refill 08/12/21 #15 Last office visit 08/12/21

## 2021-09-04 ENCOUNTER — Other Ambulatory Visit: Payer: Self-pay | Admitting: Internal Medicine

## 2021-09-04 NOTE — Telephone Encounter (Signed)
LMOV  

## 2021-09-04 NOTE — Telephone Encounter (Signed)
This patient needs a past due follow up with Dr. Saunders Revel. The patient is requesting refills on medications.  Thanks, Ivin Booty

## 2021-09-09 ENCOUNTER — Telehealth: Payer: Self-pay | Admitting: Family Medicine

## 2021-09-09 MED ORDER — FUROSEMIDE 40 MG PO TABS: 40.0000 mg | ORAL_TABLET | Freq: Every day | ORAL | 1 refills | Status: DC

## 2021-09-09 MED ORDER — ROSUVASTATIN CALCIUM 40 MG PO TABS: 40.0000 mg | ORAL_TABLET | Freq: Every day | ORAL | 1 refills | Status: DC

## 2021-09-09 NOTE — Telephone Encounter (Signed)
°*  STAT* If patient is at the pharmacy, call can be transferred to refill team.   1. Which medications need to be refilled? (please list name of each medication and dose if known) furosemide and rosuvastatin  2. Which pharmacy/location (including street and city if local pharmacy) is medication to be sent to? [facility]   3. Do they need a 30 day or 90 day supply? 90     Scheduled in march

## 2021-09-09 NOTE — Telephone Encounter (Signed)
LVM to schedule

## 2021-09-09 NOTE — Addendum Note (Signed)
Addended by: Carter Kitten on: 09/09/2021 11:48 AM   Modules accepted: Orders

## 2021-09-09 NOTE — Telephone Encounter (Addendum)
Refills sent to [facility].  Todd Owens notified by telephone. While on the phone, he states he also needs a refill on his furosemide.  Refill sent.

## 2021-09-09 NOTE — Telephone Encounter (Signed)
Mr. Bubb called in and stated that his heart doctor changed the cholesterol medication and he is having problem with getting the medication and he stated that it might be due to it say the heart dr name and not Dr. Diona Browner due to shes his PCP

## 2021-09-17 ENCOUNTER — Other Ambulatory Visit: Payer: Self-pay | Admitting: Family Medicine

## 2021-09-17 NOTE — Telephone Encounter (Signed)
Last office visit 08/12/2021 for acute mid back pain.  Last refilled 06/26/2021 for 100 g with 2 refills.  No future appointments with PCP.  ?

## 2021-09-26 ENCOUNTER — Ambulatory Visit: Payer: BC Managed Care – PPO | Admitting: Internal Medicine

## 2021-09-26 ENCOUNTER — Encounter: Payer: Self-pay | Admitting: Internal Medicine

## 2021-09-26 ENCOUNTER — Other Ambulatory Visit: Payer: Self-pay

## 2021-09-26 VITALS — BP 128/78 | HR 59 | Ht 69.0 in | Wt 341.0 lb

## 2021-09-26 DIAGNOSIS — I10 Essential (primary) hypertension: Secondary | ICD-10-CM | POA: Diagnosis not present

## 2021-09-26 DIAGNOSIS — I2510 Atherosclerotic heart disease of native coronary artery without angina pectoris: Secondary | ICD-10-CM | POA: Diagnosis not present

## 2021-09-26 DIAGNOSIS — I5032 Chronic diastolic (congestive) heart failure: Secondary | ICD-10-CM | POA: Diagnosis not present

## 2021-09-26 DIAGNOSIS — I5033 Acute on chronic diastolic (congestive) heart failure: Secondary | ICD-10-CM | POA: Diagnosis not present

## 2021-09-26 DIAGNOSIS — E785 Hyperlipidemia, unspecified: Secondary | ICD-10-CM

## 2021-09-26 DIAGNOSIS — Z79899 Other long term (current) drug therapy: Secondary | ICD-10-CM

## 2021-09-26 MED ORDER — DAPAGLIFLOZIN PROPANEDIOL 10 MG PO TABS: 10.0000 mg | ORAL_TABLET | Freq: Every day | ORAL | 3 refills | Status: DC

## 2021-09-26 MED ORDER — FUROSEMIDE 40 MG PO TABS: 40.0000 mg | ORAL_TABLET | Freq: Every day | ORAL | 1 refills | Status: DC

## 2021-09-26 NOTE — Progress Notes (Signed)
? ?Follow-up Outpatient Visit ?Date: 09/26/2021 ? ?Primary Care Provider: ?Jinny Sanders, MD ?[address] ?[place] [address] ? ?Chief Complaint: Follow-up coronary artery disease and HFpEF ? ?HPI:  Todd Owens is a 56 y.o. male with history of moderate multivessel coronary artery disease (not hemodynamically significant by RFR), chronic HFpEF, syncope, hypertension, hyperlipidemia, obstructive sleep apnea, and morbid obesity, who presents for follow-up of coronary artery disease and HFpEF.  I last saw him in 04/2021, at which time he reported stable mild exertional dyspnea and modest improvement in chronic lower extremity edema.  He was without chest pain.  He noted some myalgias and arthralgias.  We agreed to a 1 month statin holiday to see if the symptoms would improve.  We otherwise deferred medication changes. ? ?Today, Todd Owens reports that he is feeling fairly well.  He is back on statin therapy.  He notes intermittent exertional dyspnea when walking from his truck to the office.  He is also put on a little bit of weight but does not feel like he is retaining much fluid.  He denies chest pain, palpitations, lightheadedness, and syncope.  He is currently on Trulicity to help manage his prediabetes and to assist with weight loss. ? ?-------------------------------------------------------------------------------------------------- ? ?Cardiovascular History & Procedures: ?Cardiovascular Problems: ?Vasovagal syncope ?Coronary artery disease (see CTA and catheterization below) ?HFpEF ?  ?Risk Factors: ?Hypertension, hyperlipidemia, obesity, family history, and male gender ?  ?Cath/PCI: ?LHC (8/5/2.22): LMCA normal.  60% proximal and 30% mid LAD stenoses (RFR 0.91), 50% ostial ramus intermedius lesion (RFR 1.0), normal LCx, and dominant RCA with minimal luminal irregularities.  LVEF 55-65% with mildly to moderately elevated filling pressure (LVEDP 20-25 mmHg).   ?  ?CV Surgery: ?None ?  ?EP Procedures and  Devices: ?None ?  ?Non-Invasive Evaluation(s): ?Coronary CTA (01/09/2021): Poor quality study due to attenuation (obesity).  CAC 1155 (99th percentile).  Moderate to severe multivessel CAD of up to 50-69%.  CT FFR 0.72 in distal LAD and 0.77 in distal LCx. ?TTE (09/20/2019): Technically difficult study with LVEF of 55-60%.  Normal RV size and function.  Normal PA pressure.  No significant valvular abnormality. ?Exercise tolerance test (08/10/16): Fair exercise capacity (6 minutes, 48 seconds) achieving 155 bpm (91% MPHR). Hypertensive response to exercise. No significant ST-T segment or T-wave changes. Low risk study. ?Transthoracic echocardiogram (07/25/16): Normal LV size with moderate LVH. LVEF 60-65%. Normal diastolic function. Mildly dilated aortic root. Mild left atrial enlargement. Normal RV size and function. No significant valvular abnormalities. ?Pharmacologic myocardial perfusion stress test (04/28/11): Normal study without ischemia or scar. LVEF 54%. ? ?Recent CV Pertinent Labs: ?Lab Results  ?Component Value Date  ? CHOL 104 04/25/2021  ? HDL 27.60 (L) 04/25/2021  ? [place] 61 04/25/2021  ? TRIG 81.0 04/25/2021  ? CHOLHDL 4 04/25/2021  ? K 4.0 04/25/2021  ? MG 1.9 01/24/2021  ? BUN 14 04/25/2021  ? BUN 11 03/19/2021  ? CREATININE 0.92 04/25/2021  ? ? ?Past medical and surgical history were reviewed and updated in EPIC. ? ?Current Meds  ?Medication Sig  ? aspirin (ASPIRIN LOW DOSE) 81 MG EC tablet Take 1 tablet (81 mg total) by mouth daily.  ? cyclobenzaprine (FLEXERIL) 10 MG tablet TAKE 1 TABLET BY MOUTH AT BEDTIME AS NEEDED FOR MUSCLE SPASMS  ? diclofenac Sodium (VOLTAREN) 1 % GEL APPLY 4 GRAMS TOPICALLY 4 TIMES DAILY AS NEEDED**ASPIRIN & NSAIDS ORALLY NOT RECOMMENDED GIVEN CARDIAC ISSUES  ? Dulaglutide (TRULICITY) 1.5 PN/3.6RW SOPN Inject 1.5  mg into the skin once a week.  ? furosemide (LASIX) 40 MG tablet Take 1 tablet (40 mg total) by mouth daily.  ? losartan (COZAAR) 100 MG tablet TAKE 1 TABLET BY MOUTH  EVERY DAY  ? nitroGLYCERIN (NITROSTAT) 0.4 MG SL tablet Place 0.4 mg under the tongue every 5 (five) minutes as needed for chest pain.  ? omeprazole (PRILOSEC) 40 MG capsule TAKE 1 CAPSULE BY MOUTH EVERY DAY  ? rosuvastatin (CRESTOR) 40 MG tablet Take 1 tablet (40 mg total) by mouth daily.  ? ? ?Allergies: Patient has no known allergies. ? ?Social History  ? ?Tobacco Use  ? Smoking status: Former  ?  Packs/day: 1.00  ?  Years: 15.00  ?  Pack years: 15.00  ?  Types: Cigarettes  ?  Quit date: 1995  ?  Years since quitting: 28.2  ? Smokeless tobacco: Current  ?  Types: Chew  ?Vaping Use  ? Vaping Use: Never used  ?Substance Use Topics  ? Alcohol use: No  ? Drug use: No  ? ? ?Family History  ?Problem Relation Age of Onset  ? Hypertension Mother   ? Heart disease Mother   ?     pacemaker  ? Hypertension Father   ? Stroke Father   ? Heart disease Father 2  ? Heart attack Father   ? Heart disease Sister   ? Heart attack Sister 81  ? Heart failure Sister   ? Colon cancer Neg Hx   ? Esophageal cancer Neg Hx   ? Liver cancer Neg Hx   ? Pancreatic cancer Neg Hx   ? Stomach cancer Neg Hx   ? Rectal cancer Neg Hx   ? ? ?Review of Systems: ?A 12-system review of systems was performed and was negative except as noted in the HPI. ? ?-------------------------------------------------------------------------------------------------- ? ?Physical Exam: ?BP 128/78 (BP Location: Left Arm, Patient Position: Sitting, Cuff Size: Large)   Pulse (!) 59   Ht '5\' 9"'$  (1.753 m)   Wt (!) 341 lb (154.7 kg)   SpO2 98%   BMI 50.36 kg/m?  ? ?General:  NAD. ?Neck: Unable to assess JVP due to body habitus ?Lungs: Clear to auscultation bilaterally without wheezes or crackles. ?Heart: Distant heart sounds.  Bradycardic but regular without murmurs, rubs, or gallops. ?Abdomen: Soft, nontender, nondistended. ?Extremities: 1+ pretibial edema bilaterally ? ?EKG: Sinus bradycardia with isolated PVC. ? ?Lab Results  ?Component Value Date  ? WBC 5.8  01/24/2021  ? HGB 14.1 01/24/2021  ? HCT 40.8 01/24/2021  ? MCV 99 (H) 01/24/2021  ? PLT 290 01/24/2021  ? ? ?Lab Results  ?Component Value Date  ? NA 138 04/25/2021  ? K 4.0 04/25/2021  ? CL 102 04/25/2021  ? CO2 31 04/25/2021  ? BUN 14 04/25/2021  ? CREATININE 0.92 04/25/2021  ? GLUCOSE 112 (H) 04/25/2021  ? ALT 19 04/25/2021  ? ? ?Lab Results  ?Component Value Date  ? CHOL 104 04/25/2021  ? HDL 27.60 (L) 04/25/2021  ? [place] 61 04/25/2021  ? TRIG 81.0 04/25/2021  ? CHOLHDL 4 04/25/2021  ? ? ?-------------------------------------------------------------------------------------------------- ? ?ASSESSMENT AND PLAN: ?Coronary artery disease: ?Todd Owens reports some exertional dyspnea at times but no angina.  Catheterization in 02/2021 showed moderate, nonobstructive CAD.  We will continue with medical therapy to prevent progression of disease. ? ?Acute on chronic HFpEF: ?Todd Owens reports intermittent exertional dyspnea when walking from his car to the office.  He also has mild edema and weight gain on  exam today.  I encouraged him to increase furosemide to 40 mg twice daily for 2 to 3 days to see if that helps with his edema and dyspnea.  We also discussed long-term management of HFpEF and have agreed to discontinue Trulicity and initiate dapagliflozin 10 mg daily for management of his prediabetes as well as HFpEF. ? ?Hypertension: ?Blood pressure is at goal today.  Continue current medications. ? ?Hyperlipidemia: ?Todd Owens is back on rosuvastatin, which we will plan to continue to prevent progression of CAD.  LDL at goal on last check. ? ?Morbid obesity: ?BMI remains greater than 50 with multiple comorbidities.  Weight loss encouraged through diet and exercise. ? ?Follow-up: Return to clinic in 6 weeks. ? ?Nelva Bush, MD ?09/26/2021 ?9:50 AM ? ?

## 2021-09-26 NOTE — Patient Instructions (Signed)
Medication Instructions:  ? ?Your physician has recommended you make the following change in your medication:  ? ?INCREASE Furosemide (Lasix) 40 mg TWICE daily for 2-3 days, THEN continue 40 mg DAILY ? ?YOU MAY take an extra tablet (40 mg) of Lasix AS NEEDED for weight gain, swelling, shortness of breath ? ? ?STOP Trulicity ? ?START Farxiga 10 mg daily - samples given today / use 30 day card at pharmacy when pick up first prescription ? ? ?*If you need a refill on your cardiac medications before your next appointment, please call your pharmacy* ? ? ?Lab Work: ? ?BMET today ? ?If you have labs (blood work) drawn today and your tests are completely normal, you will receive your results only by: ?MyChart Message (if you have MyChart) OR ?A paper copy in the mail ?If you have any lab test that is abnormal or we need to change your treatment, we will call you to review the results. ? ? ?Testing/Procedures: ? ?None ordered ? ? ?Follow-Up: ?At [facility], you and your health needs are our priority.  As part of our continuing mission to provide you with exceptional heart care, we have created designated Provider Care Teams.  These Care Teams include your primary Cardiologist (physician) and Advanced Practice Providers (APPs -  Physician Assistants and Nurse Practitioners) who all work together to provide you with the care you need, when you need it. ? ?We recommend signing up for the patient portal called "MyChart".  Sign up information is provided on this After Visit Summary.  MyChart is used to connect with patients for Virtual Visits (Telemedicine).  Patients are able to view lab/test results, encounter notes, upcoming appointments, etc.  Non-urgent messages can be sent to your provider as well.   ?To learn more about what you can do with MyChart, go to [email or web address].   ? ?Your next appointment:   ?6 week(s) ? ?The format for your next appointment:   ?In Person ? ?Provider:   ?You may see Dr. Harrell Gave  End or one of the following Advanced Practice Providers on your designated Care Team:   ?Murray Hodgkins, NP ?Christell Faith, PA-C ?Cadence Kathlen Mody, PA-C ?

## 2021-09-27 ENCOUNTER — Encounter: Payer: Self-pay | Admitting: Internal Medicine

## 2021-09-27 LAB — BASIC METABOLIC PANEL
BUN/Creatinine Ratio: 13 (ref 9–20)
BUN: 13 mg/dL (ref 6–24)
CO2: 24 mmol/L (ref 20–29)
Calcium: 9.1 mg/dL (ref 8.7–10.2)
Chloride: 102 mmol/L (ref 96–106)
Creatinine, Ser: 0.99 mg/dL (ref 0.76–1.27)
Glucose: 130 mg/dL — ABNORMAL HIGH (ref 70–99)
Potassium: 4.2 mmol/L (ref 3.5–5.2)
Sodium: 140 mmol/L (ref 134–144)
eGFR: 90 mL/min/{1.73_m2} (ref >59)

## 2021-10-06 ENCOUNTER — Telehealth: Payer: Self-pay | Admitting: *Deleted

## 2021-10-06 ENCOUNTER — Telehealth (INDEPENDENT_AMBULATORY_CARE_PROVIDER_SITE_OTHER): Payer: BC Managed Care – PPO | Admitting: Family

## 2021-10-06 ENCOUNTER — Encounter: Payer: Self-pay | Admitting: Family

## 2021-10-06 ENCOUNTER — Other Ambulatory Visit: Payer: Self-pay

## 2021-10-06 VITALS — Ht 69.0 in | Wt 330.0 lb

## 2021-10-06 DIAGNOSIS — J069 Acute upper respiratory infection, unspecified: Secondary | ICD-10-CM

## 2021-10-06 DIAGNOSIS — R509 Fever, unspecified: Secondary | ICD-10-CM

## 2021-10-06 DIAGNOSIS — J029 Acute pharyngitis, unspecified: Secondary | ICD-10-CM

## 2021-10-06 DIAGNOSIS — R059 Cough, unspecified: Secondary | ICD-10-CM | POA: Diagnosis not present

## 2021-10-06 DIAGNOSIS — R051 Acute cough: Secondary | ICD-10-CM

## 2021-10-06 LAB — POCT RAPID STREP A (OFFICE): Rapid Strep A Screen: NEGATIVE

## 2021-10-06 LAB — POC INFLUENZA A&B (BINAX/QUICKVUE): Influenza A, POC: NEGATIVE

## 2021-10-06 MED ORDER — GUAIFENESIN-DM 100-10 MG/5ML PO SYRP
5.0000 mL | ORAL_SOLUTION | ORAL | 0 refills | Status: AC | PRN
Start: 2021-10-06 — End: 2021-10-11

## 2021-10-06 NOTE — Progress Notes (Signed)
MyChart Video Visit    Virtual Visit via Video Note   This visit type was conducted due to national recommendations for restrictions regarding the COVID-19 Pandemic (e.g. social distancing) in an effort to limit this patient's exposure and mitigate transmission in our community. This patient is at least at moderate risk for complications without adequate follow up. This format is felt to be most appropriate for this patient at this time. Physical exam was limited by quality of the video and audio technology used for the visit. CMA was able to get the patient set up on a video visit.  Patient location: Home. Patient and provider in visit Provider location: Office  I discussed the limitations of evaluation and management by telemedicine and the availability of in person appointments. The patient expressed understanding and agreed to proceed.  Visit Date: 10/06/2021  Today's healthcare provider: Mort Sawyers, FNP     Subjective:    Patient ID: Todd Owens, male    DOB: 13-Jun-1966, 56 y.o.   MRN: [id number]  Chief Complaint  Patient presents with   Sore Throat    Sore throat, congestion, dry coughing, fever --4 days. Tried tylenol    Sore Throat  Associated symptoms include congestion, coughing and shortness of breath. Pertinent negatives include no ear pain.   56 y/o male with concerns via video visit.  Four days ago started with dry scratchy throat, and over the weekend with increasing sore throat. Also with dry nonproductive cough and recently started with fever and nasal congestion. When he breathes feels like he has wheezing in his chest. Did covid test yesterday and was negative. Last fever was right before bed last night. Taking tylenol for fever. Only sob with coughing fits.   Past Medical History:  Diagnosis Date   Acute sinusitis, unspecified    Dermatophytosis of nail    Esophageal reflux    Essential hypertension, benign    Impotence of organic origin     Morbid obesity (HCC)    Obstructive sleep apnea (adult) (pediatric)    Other and unspecified hyperlipidemia    Other malaise and fatigue    Other testicular hypofunction    Pure hypercholesterolemia    Rosacea    Routine general medical examination at a health care facility    Sleep apnea     Past Surgical History:  Procedure Laterality Date   BACK SURGERY  07/20/2001   CARDIAC CATHETERIZATION     cardiolyte  07/21/1999   neg   INTRAVASCULAR PRESSURE WIRE/FFR STUDY N/A 02/21/2021   Procedure: INTRAVASCULAR PRESSURE WIRE/FFR STUDY;  Surgeon: Yvonne Kendall, MD;  Location: [facility];  Service: Cardiovascular;  Laterality: N/A;   KNEE SURGERY  07/21/2003   LEFT HEART CATH AND CORONARY ANGIOGRAPHY N/A 02/21/2021   Procedure: LEFT HEART CATH AND CORONARY ANGIOGRAPHY;  Surgeon: Yvonne Kendall, MD;  Location: [facility];  Service: Cardiovascular;  Laterality: N/A;   PFT  07/20/2005   WNL   WRIST SURGERY Right     Family History  Problem Relation Age of Onset   Hypertension Mother    Heart disease Mother        pacemaker   Hypertension Father    Stroke Father    Heart disease Father 31   Heart attack Father    Heart disease Sister    Heart attack Sister 32   Heart failure Sister    Colon cancer Neg Hx    Esophageal cancer Neg Hx  Liver cancer Neg Hx    Pancreatic cancer Neg Hx    Stomach cancer Neg Hx    Rectal cancer Neg Hx     Social History   Socioeconomic History   Marital status: Married    Spouse name: Not on file   Number of children: Not on file   Years of education: Not on file   Highest education level: Not on file  Occupational History   Not on file  Tobacco Use   Smoking status: Former    Packs/day: 1.00    Years: 15.00    Pack years: 15.00    Types: Cigarettes    Quit date: 38    Years since quitting: 28.2   Smokeless tobacco: Current    Types: Chew  Vaping Use   Vaping Use: Never used  Substance and Sexual Activity    Alcohol use: No   Drug use: No   Sexual activity: Yes  Other Topics Concern   Not on file  Social History Narrative   Not on file   Social Determinants of Health   Financial Resource Strain: Not on file  Food Insecurity: Not on file  Transportation Needs: Not on file  Physical Activity: Not on file  Stress: Not on file  Social Connections: Not on file  Intimate Partner Violence: Not on file    Outpatient Medications Prior to Visit  Medication Sig Dispense Refill   aspirin (ASPIRIN LOW DOSE) 81 MG EC tablet Take 1 tablet (81 mg total) by mouth daily. 90 tablet 3   cyclobenzaprine (FLEXERIL) 10 MG tablet TAKE 1 TABLET BY MOUTH AT BEDTIME AS NEEDED FOR MUSCLE SPASMS 15 tablet 0   dapagliflozin propanediol (FARXIGA) 10 MG TABS tablet Take 1 tablet (10 mg total) by mouth daily before breakfast. 30 tablet 3   diclofenac Sodium (VOLTAREN) 1 % GEL APPLY 4 GRAMS TOPICALLY 4 TIMES DAILY AS NEEDED**ASPIRIN & NSAIDS ORALLY NOT RECOMMENDED GIVEN CARDIAC ISSUES     furosemide (LASIX) 40 MG tablet Take 1 tablet (40 mg total) by mouth daily. May take an extra tablet (40 mg) as needed for weight gain, swelling, shortness of breath. 90 tablet 1   losartan (COZAAR) 100 MG tablet TAKE 1 TABLET BY MOUTH EVERY DAY 90 tablet 1   nitroGLYCERIN (NITROSTAT) 0.4 MG SL tablet Place 0.4 mg under the tongue every 5 (five) minutes as needed for chest pain.     omeprazole (PRILOSEC) 40 MG capsule TAKE 1 CAPSULE BY MOUTH EVERY DAY 90 capsule 3   rosuvastatin (CRESTOR) 40 MG tablet Take 1 tablet (40 mg total) by mouth daily. 90 tablet 1   No facility-administered medications prior to visit.    No Known Allergies  Review of Systems  Constitutional:  Negative for chills and fever.  HENT:  Positive for congestion and sore throat. Negative for ear pain.   Respiratory:  Positive for cough, shortness of breath and wheezing.   Cardiovascular:  Negative for chest pain.  All other systems reviewed and are  negative.     Objective:    Physical Exam Constitutional:      Appearance: Normal appearance.  Neurological:     General: No focal deficit present.     Mental Status: He is alert and oriented to person, place, and time.  Psychiatric:        Mood and Affect: Mood normal.        Behavior: Behavior normal.        Thought Content: Thought content normal.  Judgment: Judgment normal.    Ht 5\' 9"  (1.753 m)   Wt (!) 330 lb (149.7 kg)   BMI 48.73 kg/m  Wt Readings from Last 3 Encounters:  10/06/21 (!) 330 lb (149.7 kg)  09/26/21 (!) 341 lb (154.7 kg)  08/12/21 (!) 336 lb 6 oz (152.6 kg)       Assessment & Plan:   Problem List Items Addressed This Visit       Respiratory   Viral upper respiratory tract infection    Likely viral at this point we will treat as such as negative flu COVID and strep Advised patient on supportive measures:  Be sure to rest, drink plenty of fluids, and use tylenol  as needed for pain. Follow up if fever >101, if symptoms worsen or if symptoms are not improved in 3 days. Patient verbalizes understanding.           Other   Acute cough    Patient requesting medication for cough have sent in Robitussin-DM      Relevant Medications   guaiFENesin-dextromethorphan (ROBITUSSIN DM) 100-10 MG/5ML syrup   Fever    Rapid flu tested and was negative COVID test was negative at home      Relevant Orders   POCT rapid strep A (Completed)   POC Influenza A&B(BINAX/QUICKVUE) (Completed)   Sore throat - Primary    Rapid strep ordered and was negative      Relevant Orders   POCT rapid strep A (Completed)   POC Influenza A&B(BINAX/QUICKVUE) (Completed)    I am having Tirrell L. Edgecombe start on guaiFENesin-dextromethorphan. I am also having him maintain his nitroGLYCERIN, omeprazole, aspirin, losartan, cyclobenzaprine, rosuvastatin, diclofenac Sodium, furosemide, and dapagliflozin propanediol.  Meds ordered this encounter  Medications    guaiFENesin-dextromethorphan (ROBITUSSIN DM) 100-10 MG/5ML syrup    Sig: Take 5 mLs by mouth every 4 (four) hours as needed for up to 5 days for cough.    Dispense:  118 mL    Refill:  0    Order Specific Question:   Supervising Provider    Answer:   BEDSOLE, AMY E [[id number]]    I discussed the assessment and treatment plan with the patient. The patient was provided an opportunity to ask questions and all were answered. The patient agreed with the plan and demonstrated an understanding of the instructions.   The patient was advised to call back or seek an in-person evaluation if the symptoms worsen or if the condition fails to improve as anticipated.  I provided 18 minutes of face-to-face time during this encounter.   Mort Sawyers, FNP [facility] [place] [phone] (phone) [phone] (fax)  [facility]

## 2021-10-06 NOTE — Telephone Encounter (Signed)
Pt requesting medication for cough.

## 2021-10-07 DIAGNOSIS — R051 Acute cough: Secondary | ICD-10-CM | POA: Insufficient documentation

## 2021-10-07 DIAGNOSIS — J069 Acute upper respiratory infection, unspecified: Secondary | ICD-10-CM | POA: Insufficient documentation

## 2021-10-07 DIAGNOSIS — J029 Acute pharyngitis, unspecified: Secondary | ICD-10-CM | POA: Insufficient documentation

## 2021-10-07 DIAGNOSIS — R509 Fever, unspecified: Secondary | ICD-10-CM | POA: Insufficient documentation

## 2021-10-07 NOTE — Telephone Encounter (Signed)
Medication for cough called in 3/20. ?

## 2021-10-07 NOTE — Assessment & Plan Note (Signed)
Patient requesting medication for cough have sent in Robitussin-DM ?

## 2021-10-07 NOTE — Assessment & Plan Note (Addendum)
Likely viral at this point we will treat as such as negative flu COVID and strep ?Advised patient on supportive measures:  Be sure to rest, drink plenty of fluids, and use tylenol  as needed for pain. Follow up if fever >101, if symptoms worsen or if symptoms are not improved in 3 days. Patient verbalizes understanding.  ? ? ?

## 2021-10-07 NOTE — Assessment & Plan Note (Signed)
Rapid flu tested and was negative COVID test was negative at home ?

## 2021-10-07 NOTE — Assessment & Plan Note (Signed)
Rapid strep ordered and was negative ?

## 2021-10-23 ENCOUNTER — Ambulatory Visit: Payer: BC Managed Care – PPO | Admitting: Family

## 2021-10-23 ENCOUNTER — Encounter: Payer: Self-pay | Admitting: Family

## 2021-10-23 VITALS — BP 120/57 | HR 63 | Temp 99.0°F | Resp 16 | Ht 69.0 in | Wt 328.3 lb

## 2021-10-23 DIAGNOSIS — R051 Acute cough: Secondary | ICD-10-CM

## 2021-10-23 DIAGNOSIS — J45901 Unspecified asthma with (acute) exacerbation: Secondary | ICD-10-CM | POA: Diagnosis not present

## 2021-10-23 MED ORDER — ALBUTEROL SULFATE HFA 108 (90 BASE) MCG/ACT IN AERS: 2.0000 | INHALATION_SPRAY | Freq: Four times a day (QID) | RESPIRATORY_TRACT | 0 refills | Status: DC | PRN

## 2021-10-23 MED ORDER — FLUTICASONE PROPIONATE 50 MCG/ACT NA SUSP: 1.0000 | Freq: Two times a day (BID) | NASAL | 0 refills | Status: DC

## 2021-10-23 MED ORDER — AMOXICILLIN-POT CLAVULANATE 875-125 MG PO TABS: 1.0000 | ORAL_TABLET | Freq: Two times a day (BID) | ORAL | 0 refills | Status: DC

## 2021-10-23 MED ORDER — LORATADINE 10 MG PO TABS: 10.0000 mg | ORAL_TABLET | Freq: Every day | ORAL | 11 refills | Status: DC

## 2021-10-23 MED ORDER — GUAIFENESIN-CODEINE 100-10 MG/5ML PO SYRP: 5.0000 mL | ORAL_SOLUTION | Freq: Three times a day (TID) | ORAL | 0 refills | Status: AC | PRN

## 2021-10-23 NOTE — Assessment & Plan Note (Addendum)
Prescription given for augmentin 875/125 mg po bid for ten days.  ?Pt to continue tylenol prn sinus pain. Continue with humidifier prn and steam showers recommended as well. instructed If no symptom improvement in 48 hours please f/u ? ?Recommend daily flonase 50 mcg ?Recommend daily claritin 10 mg ?

## 2021-10-23 NOTE — Progress Notes (Signed)
? ?Established Patient Office Visit ? ?Subjective:  ?Patient ID: Todd Owens, male    DOB: 1966/01/12  Age: 56 y.o. MRN: [id number] ? ?CC:  ?Chief Complaint  ?Patient presents with  ? Nasal Congestion  ? Cough  ?  Cough so much chest feels raw.  ? ? ?HPI ?Todd Owens is here today with concerns.  ? ?Seen via video visit on 10/06/21 suspected viral so was given robitussin DM ?Influ and strep was negative.  ? ?States with nasal congestion, coughing a lot and chest feels raw with chest congestion.  ?Sinus pressure and pain. No sore throat. Right ear pain with some discomfort. No fever no chills.  ?Sob only when coughing.  ? ?Does work outside so also thinking may be exacerbated with allergies.  ? ?FYI had been seeing cardiologist 3/10 and was increased to furosemide to 40 mg twice daily and started on farxiga 10 mg daily. D/c trulicity was it was causing him to gain weight.  ? ? ?Past Medical History:  ?Diagnosis Date  ? Acute sinusitis, unspecified   ? Dermatophytosis of nail   ? Esophageal reflux   ? Essential hypertension, benign   ? Impotence of organic origin   ? Morbid obesity ([place])   ? Obstructive sleep apnea (adult) (pediatric)   ? Other and unspecified hyperlipidemia   ? Other malaise and fatigue   ? Other testicular hypofunction   ? Pure hypercholesterolemia   ? Rosacea   ? Routine general medical examination at a health care facility   ? Sleep apnea   ? ? ?Past Surgical History:  ?Procedure Laterality Date  ? BACK SURGERY  07/20/2001  ? CARDIAC CATHETERIZATION    ? cardiolyte  07/21/1999  ? neg  ? INTRAVASCULAR PRESSURE WIRE/FFR STUDY N/A 02/21/2021  ? Procedure: INTRAVASCULAR PRESSURE WIRE/FFR STUDY;  Surgeon: Nelva Bush, MD;  Location: [place] [facility];  Service: Cardiovascular;  Laterality: N/A;  ? KNEE SURGERY  07/21/2003  ? LEFT HEART CATH AND CORONARY ANGIOGRAPHY N/A 02/21/2021  ? Procedure: LEFT HEART CATH AND CORONARY ANGIOGRAPHY;  Surgeon: Nelva Bush, MD;  Location: [place] [facility];  Service: Cardiovascular;  Laterality: N/A;  ? PFT  07/20/2005  ? WNL  ? WRIST SURGERY Right   ? ? ?Family History  ?Problem Relation Age of Onset  ? Hypertension Mother   ? Heart disease Mother   ?     pacemaker  ? Hypertension Father   ? Stroke Father   ? Heart disease Father 48  ? Heart attack Father   ? Heart disease Sister   ? Heart attack Sister 56  ? Heart failure Sister   ? Colon cancer Neg Hx   ? Esophageal cancer Neg Hx   ? Liver cancer Neg Hx   ? Pancreatic cancer Neg Hx   ? Stomach cancer Neg Hx   ? Rectal cancer Neg Hx   ? ? ?Social History  ? ?Socioeconomic History  ? Marital status: Married  ?  Spouse name: Not on file  ? Number of children: Not on file  ? Years of education: Not on file  ? Highest education level: Not on file  ?Occupational History  ? Not on file  ?Tobacco Use  ? Smoking status: Former  ?  Packs/day: 1.00  ?  Years: 15.00  ?  Pack years: 15.00  ?  Types: Cigarettes  ?  Quit date: 1995  ?  Years since quitting: 28.2  ? Smokeless tobacco: Current  ?  Types: Chew  ?Vaping Use  ? Vaping Use: Never used  ?Substance and Sexual Activity  ? Alcohol use: No  ? Drug use: No  ? Sexual activity: Yes  ?Other Topics Concern  ? Not on file  ?Social History Narrative  ? Not on file  ? ?Social Determinants of Health  ? ?Financial Resource Strain: Not on file  ?Food Insecurity: Not on file  ?Transportation Needs: Not on file  ?Physical Activity: Not on file  ?Stress: Not on file  ?Social Connections: Not on file  ?Intimate Partner Violence: Not on file  ? ? ?Outpatient Medications Prior to Visit  ?Medication Sig Dispense Refill  ? aspirin (ASPIRIN LOW DOSE) 81 MG EC tablet Take 1 tablet (81 mg total) by mouth daily. 90 tablet 3  ? cyclobenzaprine (FLEXERIL) 10 MG tablet TAKE 1 TABLET BY MOUTH AT BEDTIME AS NEEDED FOR MUSCLE SPASMS 15 tablet 0  ? dapagliflozin propanediol (FARXIGA) 10 MG TABS tablet Take 1 tablet (10 mg total) by mouth daily before breakfast. 30 tablet 3  ? diclofenac Sodium  (VOLTAREN) 1 % GEL APPLY 4 GRAMS TOPICALLY 4 TIMES DAILY AS NEEDED**ASPIRIN & NSAIDS ORALLY NOT RECOMMENDED GIVEN CARDIAC ISSUES    ? furosemide (LASIX) 40 MG tablet Take 1 tablet (40 mg total) by mouth daily. May take an extra tablet (40 mg) as needed for weight gain, swelling, shortness of breath. 90 tablet 1  ? losartan (COZAAR) 100 MG tablet TAKE 1 TABLET BY MOUTH EVERY DAY 90 tablet 1  ? nitroGLYCERIN (NITROSTAT) 0.4 MG SL tablet Place 0.4 mg under the tongue every 5 (five) minutes as needed for chest pain.    ? omeprazole (PRILOSEC) 40 MG capsule TAKE 1 CAPSULE BY MOUTH EVERY DAY 90 capsule 3  ? rosuvastatin (CRESTOR) 40 MG tablet Take 1 tablet (40 mg total) by mouth daily. 90 tablet 1  ? ?No facility-administered medications prior to visit.  ? ? ?No Known Allergies ? ?ROS ?Review of Systems  ?Constitutional:  Negative for chills and fever.  ?HENT:  Positive for congestion, ear pain (right), sinus pressure and sinus pain. Negative for sore throat.   ?Respiratory:  Positive for cough (with chest congestion), chest tightness and shortness of breath (with coughing). Negative for wheezing.   ?Cardiovascular:  Negative for chest pain and palpitations.  ? ?  ?Objective:  ?  ?Physical Exam ?Vitals reviewed.  ?Constitutional:   ?   General: He is awake. He is not in acute distress. ?   Appearance: Normal appearance. He is obese. He is not ill-appearing.  ?HENT:  ?   Right Ear: Tympanic membrane is erythematous.  ?   Left Ear: Tympanic membrane is erythematous.  ?   Nose: Nose normal.  ?   Right Turbinates: Not enlarged or swollen.  ?   Left Turbinates: Not enlarged or swollen.  ?   Right Sinus: No maxillary sinus tenderness or frontal sinus tenderness.  ?   Left Sinus: No maxillary sinus tenderness or frontal sinus tenderness.  ?   Mouth/Throat:  ?   Mouth: Mucous membranes are moist.  ?   Pharynx: Posterior oropharyngeal erythema present. No pharyngeal swelling or oropharyngeal exudate.  ?Eyes:  ?   Extraocular  Movements: Extraocular movements intact.  ?   Pupils: Pupils are equal, round, and reactive to light.  ?Cardiovascular:  ?   Rate and Rhythm: Normal rate and regular rhythm.  ?Pulmonary:  ?   Effort: Pulmonary effort is normal.  ?   Breath sounds:  Normal breath sounds. No wheezing.  ?Musculoskeletal:  ?   Cervical back: Normal range of motion.  ?Neurological:  ?   Mental Status: He is alert.  ? ? ?BP (!) 120/57   Pulse 63   Temp 99 ?F (37.2 ?C)   Resp 16   Ht _0  (1.753 m)   Wt (!) 328 lb 5 oz (148.9 kg)   SpO2 96%   BMI 48.48 kg/m?  ?Wt Readings from Last 3 Encounters:  ?10/23/21 (!) 328 lb 5 oz (148.9 kg)  ?10/06/21 (!) 330 lb (149.7 kg)  ?09/26/21 (!) 341 lb (154.7 kg)  ? ? ? ?Health Maintenance Due  ?Topic Date Due  ? COVID-19 Vaccine (1) Never done  ? Zoster Vaccines- Shingrix (1 of 2) Never done  ? ? ?There are no preventive care reminders to display for this patient. ? ?Lab Results  ?Component Value Date  ? TSH 4.32 04/25/2021  ? ?Lab Results  ?Component Value Date  ? WBC 5.8 01/24/2021  ? HGB 14.1 01/24/2021  ? HCT 40.8 01/24/2021  ? MCV 99 (H) 01/24/2021  ? PLT 290 01/24/2021  ? ?Lab Results  ?Component Value Date  ? NA 140 09/26/2021  ? K 4.2 09/26/2021  ? CO2 24 09/26/2021  ? GLUCOSE 130 (H) 09/26/2021  ? BUN 13 09/26/2021  ? CREATININE 0.99 09/26/2021  ? BILITOT 0.6 04/25/2021  ? ALKPHOS 57 04/25/2021  ? AST 22 04/25/2021  ? ALT 19 04/25/2021  ? PROT 6.7 04/25/2021  ? ALBUMIN 3.9 04/25/2021  ? CALCIUM 9.1 09/26/2021  ? ANIONGAP 10 12/20/2020  ? EGFR 90 09/26/2021  ? GFR 93.89 04/25/2021  ? ?Lab Results  ?Component Value Date  ? HGBA1C 5.9 04/25/2021  ? ? ?  ?Assessment & Plan:  ? ?Problem List Items Addressed This Visit   ? ?  ? Respiratory  ? Allergic bronchitis with acute exacerbation  ?  Prescription given for augmentin 875/125 mg po bid for ten days.  ?Pt to continue tylenol prn sinus pain. Continue with humidifier prn and steam showers recommended as well. instructed If no symptom  improvement in 48 hours please f/u ? ?Recommend daily flonase 50 mcg ?Recommend daily claritin 10 mg ?  ?  ? Relevant Medications  ? amoxicillin-clavulanate (AUGMENTIN) 875-125 MG tablet  ? guaiFENesin-codeine (ROBITUSSIN AC) 10

## 2021-10-23 NOTE — Patient Instructions (Signed)
I recommend starting daily Claritin and Flonase.  ? ?Antibiotic sent to preferred pharmacy.  ? ?Please increase oral fluids, steamy hot shower/humidifier prn. ? ?Please follow up if no improvement in 2-3 days.  ? ?It was a pleasure seeing you today! Please do not hesitate to reach out with any questions and or concerns. ? ?Regards,  ? ?Daymon Hora ? ?

## 2021-10-23 NOTE — Assessment & Plan Note (Signed)
rx ventolin 108 mcg  ?rx robitussin AC 5-10/5 mg/ml ?

## 2021-11-14 ENCOUNTER — Other Ambulatory Visit: Payer: Self-pay | Admitting: Family

## 2021-11-14 DIAGNOSIS — R051 Acute cough: Secondary | ICD-10-CM

## 2021-11-14 DIAGNOSIS — J45901 Unspecified asthma with (acute) exacerbation: Secondary | ICD-10-CM

## 2021-11-19 ENCOUNTER — Ambulatory Visit: Payer: BC Managed Care – PPO | Admitting: Internal Medicine

## 2021-12-07 ENCOUNTER — Other Ambulatory Visit: Payer: Self-pay | Admitting: Family Medicine

## 2021-12-07 NOTE — Telephone Encounter (Signed)
Last office visit 10/23/21 with Dugal for acute cough.  Last refilled 09/17/21 for 100 g with 2 refills.  No future appointments with PCP.

## 2021-12-26 ENCOUNTER — Encounter: Payer: Self-pay | Admitting: Family Medicine

## 2021-12-26 ENCOUNTER — Ambulatory Visit: Payer: BC Managed Care – PPO | Admitting: Family Medicine

## 2021-12-26 VITALS — BP 118/70 | HR 62 | Temp 96.7°F | Ht 69.0 in | Wt 327.0 lb

## 2021-12-26 DIAGNOSIS — H9312 Tinnitus, left ear: Secondary | ICD-10-CM | POA: Diagnosis not present
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
## 2021-12-26 NOTE — Patient Instructions (Signed)
Call if ear ringing not  improving in 3-4 week for referral to  ENT.

## 2021-12-26 NOTE — Assessment & Plan Note (Signed)
Acute   No sign of  rupture of eardrum or infection.  Recommended giving it time to hear... if not improving after 3-4 week we can consider referral to ENT for further evaluation.

## 2022-01-08 ENCOUNTER — Other Ambulatory Visit: Payer: Self-pay

## 2022-01-08 ENCOUNTER — Encounter (HOSPITAL_BASED_OUTPATIENT_CLINIC_OR_DEPARTMENT_OTHER): Payer: Self-pay | Admitting: Emergency Medicine

## 2022-01-08 ENCOUNTER — Emergency Department (HOSPITAL_BASED_OUTPATIENT_CLINIC_OR_DEPARTMENT_OTHER)
Admission: EM | Admit: 2022-01-08 | Discharge: 2022-01-08 | Disposition: A | Discharge status: Home / Self Care | Payer: BC Managed Care – PPO | Attending: Emergency Medicine | Admitting: Emergency Medicine

## 2022-01-08 ENCOUNTER — Emergency Department (HOSPITAL_BASED_OUTPATIENT_CLINIC_OR_DEPARTMENT_OTHER): Payer: BC Managed Care – PPO

## 2022-01-08 DIAGNOSIS — H6091 Unspecified otitis externa, right ear: Secondary | ICD-10-CM | POA: Insufficient documentation

## 2022-01-08 DIAGNOSIS — I10 Essential (primary) hypertension: Secondary | ICD-10-CM | POA: Insufficient documentation

## 2022-01-08 DIAGNOSIS — Z79899 Other long term (current) drug therapy: Secondary | ICD-10-CM | POA: Insufficient documentation

## 2022-01-08 DIAGNOSIS — H60501 Unspecified acute noninfective otitis externa, right ear: Secondary | ICD-10-CM

## 2022-01-08 DIAGNOSIS — Z7982 Long term (current) use of aspirin: Secondary | ICD-10-CM | POA: Insufficient documentation

## 2022-01-08 DIAGNOSIS — H9201 Otalgia, right ear: Secondary | ICD-10-CM | POA: Diagnosis present

## 2022-01-08 LAB — BASIC METABOLIC PANEL
Anion gap: 8 (ref 5–15)
BUN: 9 mg/dL (ref 6–20)
CO2: 27 mmol/L (ref 22–32)
Calcium: 9 mg/dL (ref 8.9–10.3)
Chloride: 102 mmol/L (ref 98–111)
Creatinine, Ser: 1.04 mg/dL (ref 0.61–1.24)
GFR, Estimated: >60 mL/min (ref >60)
Glucose, Bld: 174 mg/dL — ABNORMAL HIGH (ref 70–99)
Potassium: 3.3 mmol/L — ABNORMAL LOW (ref 3.5–5.1)
Sodium: 137 mmol/L (ref 135–145)

## 2022-01-08 LAB — CBC WITH DIFFERENTIAL/PLATELET
Abs Immature Granulocytes: 0.02 10*3/uL (ref 0.00–0.07)
Basophils Absolute: 0 10*3/uL (ref 0.0–0.1)
Basophils Relative: 1 %
Eosinophils Absolute: 0.1 10*3/uL (ref 0.0–0.5)
Eosinophils Relative: 1 %
HCT: 38.1 % — ABNORMAL LOW (ref 39.0–52.0)
Hemoglobin: 12.8 g/dL — ABNORMAL LOW (ref 13.0–17.0)
Immature Granulocytes: 0 %
Lymphocytes Relative: 28 %
Lymphs Abs: 1.8 10*3/uL (ref 0.7–4.0)
MCH: 32.6 pg (ref 26.0–34.0)
MCHC: 33.6 g/dL (ref 30.0–36.0)
MCV: 96.9 fL (ref 80.0–100.0)
Monocytes Absolute: 0.4 10*3/uL (ref 0.1–1.0)
Monocytes Relative: 7 %
Neutro Abs: 4.2 10*3/uL (ref 1.7–7.7)
Neutrophils Relative %: 63 %
Platelets: 203 10*3/uL (ref 150–400)
RBC: 3.93 MIL/uL — ABNORMAL LOW (ref 4.22–5.81)
RDW: 13.7 % (ref 11.5–15.5)
WBC: 6.6 10*3/uL (ref 4.0–10.5)
nRBC: 0 % (ref 0.0–0.2)

## 2022-01-08 MED ORDER — HYDROCODONE-ACETAMINOPHEN 5-325 MG PO TABS
1.0000 | ORAL_TABLET | Freq: Once | ORAL | Status: AC
  Administered 2022-01-08: 1 via ORAL
  Filled 2022-01-08: qty 1

## 2022-01-08 MED ORDER — IBUPROFEN 600 MG PO TABS: 600.0000 mg | ORAL_TABLET | Freq: Four times a day (QID) | ORAL | 0 refills | Status: DC | PRN

## 2022-01-08 MED ORDER — CIPROFLOXACIN HCL 500 MG PO TABS
500.0000 mg | ORAL_TABLET | Freq: Once | ORAL | Status: AC
  Administered 2022-01-08: 500 mg via ORAL
  Filled 2022-01-08: qty 1

## 2022-01-08 MED ORDER — IBUPROFEN 800 MG PO TABS
800.0000 mg | ORAL_TABLET | Freq: Once | ORAL | Status: AC
  Administered 2022-01-08: 800 mg via ORAL
  Filled 2022-01-08: qty 1

## 2022-01-08 MED ORDER — IOHEXOL 300 MG/ML  SOLN
100.0000 mL | Freq: Once | INTRAMUSCULAR | Status: AC | PRN
Start: 2022-01-08 — End: 2022-01-08
  Administered 2022-01-08: 100 mL via INTRAVENOUS

## 2022-01-08 MED ORDER — CIPROFLOXACIN-DEXAMETHASONE 0.3-0.1 % OT SUSP: 4.0000 [drp] | Freq: Two times a day (BID) | OTIC | 0 refills | Status: AC

## 2022-01-08 MED ORDER — CIPROFLOXACIN HCL 500 MG PO TABS: 500.0000 mg | ORAL_TABLET | Freq: Two times a day (BID) | ORAL | 0 refills | Status: DC

## 2022-01-08 NOTE — ED Triage Notes (Signed)
Patient reports feeling a bump inside his right ear last night. States today his right ear is swollen and he is having pain to his head and neck.

## 2022-01-08 NOTE — Discharge Instructions (Signed)
Take the antibiotics as prescribed.  Follow-up with Dr. Janace Hoard in the office Friday or Monday.  Return to the ED sooner with increasing pain, redness, drainage, fever, difficulty breathing difficulty swallowing or other concerns.

## 2022-01-08 NOTE — ED Notes (Signed)
Patient transported to CT 

## 2022-01-14 ENCOUNTER — Other Ambulatory Visit: Payer: Self-pay | Admitting: Family Medicine

## 2022-01-14 NOTE — Telephone Encounter (Signed)
Please schedule CPE with fasting labs prior for sometime after 02/07/2021.

## 2022-01-16 NOTE — Telephone Encounter (Signed)
Patient is scheduled for first available in December.

## 2022-01-22 NOTE — Progress Notes (Signed)
Follow-up Outpatient Visit Date: 01/23/2022  Primary Care Provider: Jinny Sanders, MD [place] [id number]  Chief Complaint: Follow-up coronary artery disease and HFpEF  HPI:  Mr. Mella is a 56 y.o. male with history of moderate multivessel coronary artery disease (not hemodynamically significant by RFR), chronic HFpEF, syncope, hypertension, hyperlipidemia, obstructive sleep apnea, and morbid obesity, who presents for follow-up of coronary artery disease and HFpEF.  I last saw him in March, at which time he reported exertional dyspnea walking from his truck into our office.  Mild leg edema as well as interval weight gain were also noted, prompting [place] to increase furosemide to 40 mg twice daily for a few days.  We also agreed to discontinue Trulicity and start dapagliflozin to assist with management of his prediabetes and HFpEF.  Today, Mr. Arnett reports he has been feeling fairly well.  He has noticed a little more exertional dyspnea when he is outside in the heat and humidity.  He happily reports that his weight has come down since our last visit.  He has minimal leg edema, typically only present if he eats more salt than usual.  He has not had any chest pain, palpitations, or lightheadedness.  He developed a right ear/facial infection in late June, prompting ED visit.  Temporal bone CT was unremarkable.  He was subsequently evaluated by ENT with a diagnosis of cellulitis versus shingles.  He was treated with antibiotics and noticed prompt resolution of the swelling and pain.  He is tolerating his medications well, including dapagliflozin.  --------------------------------------------------------------------------------------------------  Cardiovascular History & Procedures: Cardiovascular Problems: Vasovagal syncope Coronary artery disease (see CTA and catheterization below) HFpEF   Risk Factors: Hypertension, hyperlipidemia, obesity, family history, and male  gender   Cath/PCI: LHC (8/5/2.22): LMCA normal.  60% proximal and 30% mid LAD stenoses (RFR 0.91), 50% ostial ramus intermedius lesion (RFR 1.0), normal LCx, and dominant RCA with minimal luminal irregularities.  LVEF 55-65% with mildly to moderately elevated filling pressure (LVEDP 20-25 mmHg).     CV Surgery: None   EP Procedures and Devices: None   Non-Invasive Evaluation(s): Coronary CTA (01/09/2021): Poor quality study due to attenuation (obesity).  CAC 1155 (99th percentile).  Moderate to severe multivessel CAD of up to 50-69%.  CT FFR 0.72 in distal LAD and 0.77 in distal LCx. TTE (09/20/2019): Technically difficult study with LVEF of 55-60%.  Normal RV size and function.  Normal PA pressure.  No significant valvular abnormality. Exercise tolerance test (08/10/16): Fair exercise capacity (6 minutes, 48 seconds) achieving 155 bpm (91% MPHR). Hypertensive response to exercise. No significant ST-T segment or T-wave changes. Low risk study. Transthoracic echocardiogram (07/25/16): Normal LV size with moderate LVH. LVEF 60-65%. Normal diastolic function. Mildly dilated aortic root. Mild left atrial enlargement. Normal RV size and function. No significant valvular abnormalities. Pharmacologic myocardial perfusion stress test (04/28/11): Normal study without ischemia or scar. LVEF 54%.  Recent CV Pertinent Labs: Lab Results  Component Value Date   CHOL 104 04/25/2021   HDL 27.60 (L) 04/25/2021   LDLCALC 61 04/25/2021   TRIG 81.0 04/25/2021   CHOLHDL 4 04/25/2021   K 3.3 (L) 01/08/2022   MG 1.9 01/24/2021   BUN 9 01/08/2022   BUN 13 09/26/2021   CREATININE 1.04 01/08/2022    Past medical and surgical history were reviewed and updated in EPIC.  Current Meds  Medication Sig   albuterol (VENTOLIN HFA) 108 (90 Base) MCG/ACT inhaler TAKE 2 PUFFS BY MOUTH  EVERY 6 HOURS AS NEEDED FOR WHEEZE OR SHORTNESS OF BREATH   aspirin (ASPIRIN LOW DOSE) 81 MG EC tablet Take 1 tablet (81 mg total) by mouth  daily.   ciprofloxacin (CIPRO) 500 MG tablet Take 1 tablet (500 mg total) by mouth every 12 (twelve) hours.   cyclobenzaprine (FLEXERIL) 10 MG tablet TAKE 1 TABLET BY MOUTH AT BEDTIME AS NEEDED FOR MUSCLE SPASMS   dapagliflozin propanediol (FARXIGA) 10 MG TABS tablet Take 1 tablet (10 mg total) by mouth daily before breakfast.   diclofenac Sodium (VOLTAREN) 1 % GEL APPLY 4 GRAMS TOPICALLY 4 TIMES DAILY**ASPIRIN & NSAIDS ORALLY NOT RECOMMENDED GIVEN CARDIAC ISSUES   fluticasone (FLONASE) 50 MCG/ACT nasal spray PLACE 1 SPRAY INTO BOTH NOSTRILS 2 (TWO) TIMES DAILY   furosemide (LASIX) 40 MG tablet Take 1 tablet (40 mg total) by mouth daily. May take an extra tablet (40 mg) as needed for weight gain, swelling, shortness of breath.   ibuprofen (ADVIL) 600 MG tablet Take 1 tablet (600 mg total) by mouth every 6 (six) hours as needed.   loratadine (CLARITIN) 10 MG tablet Take 1 tablet (10 mg total) by mouth daily.   losartan (COZAAR) 100 MG tablet TAKE 1 TABLET BY MOUTH EVERY DAY   nitroGLYCERIN (NITROSTAT) 0.4 MG SL tablet Place 0.4 mg under the tongue every 5 (five) minutes as needed for chest pain.   omeprazole (PRILOSEC) 40 MG capsule TAKE 1 CAPSULE BY MOUTH EVERY DAY   rosuvastatin (CRESTOR) 40 MG tablet Take 1 tablet (40 mg total) by mouth daily.    Allergies: Patient has no known allergies.  Social History   Tobacco Use   Smoking status: Former    Packs/day: 1.00    Years: 15.00    Total pack years: 15.00    Types: Cigarettes    Quit date: 1995    Years since quitting: 28.5   Smokeless tobacco: Current    Types: Chew  Vaping Use   Vaping Use: Never used  Substance Use Topics   Alcohol use: No   Drug use: No    Family History  Problem Relation Age of Onset   Hypertension Mother    Heart disease Mother        pacemaker   Hypertension Father    Stroke Father    Heart disease Father 22   Heart attack Father    Heart disease Sister    Heart attack Sister 20   Heart failure  Sister    Colon cancer Neg Hx    Esophageal cancer Neg Hx    Liver cancer Neg Hx    Pancreatic cancer Neg Hx    Stomach cancer Neg Hx    Rectal cancer Neg Hx     Review of Systems: A 12-system review of systems was performed and was negative except as noted in the HPI.  --------------------------------------------------------------------------------------------------  Physical Exam: BP 130/76 (BP Location: Left Arm, Patient Position: Sitting, Cuff Size: Large)   Pulse 64   Ht '5\' 9"'$  (1.753 m)   Wt (!) 320 lb (145.2 kg)   SpO2 96%   BMI 47.26 kg/m   General:  NAD. Neck: No JVD or HJR, though body habitus limits evaluation. Lungs: Clear to auscultation bilaterally without wheezes or crackles. Heart: Regular rate and rhythm without murmurs, rubs, or gallops. Abdomen: Soft, nontender, nondistended. Extremities: No lower extremity edema.   Lab Results  Component Value Date   WBC 6.6 01/08/2022   HGB 12.8 (L) 01/08/2022   HCT 38.1 (  L) 01/08/2022   MCV 96.9 01/08/2022   PLT 203 01/08/2022    Lab Results  Component Value Date   NA 137 01/08/2022   K 3.3 (L) 01/08/2022   CL 102 01/08/2022   CO2 27 01/08/2022   BUN 9 01/08/2022   CREATININE 1.04 01/08/2022   GLUCOSE 174 (H) 01/08/2022   ALT 19 04/25/2021    Lab Results  Component Value Date   CHOL 104 04/25/2021   HDL 27.60 (L) 04/25/2021   LDLCALC 61 04/25/2021   TRIG 81.0 04/25/2021   CHOLHDL 4 04/25/2021    --------------------------------------------------------------------------------------------------  ASSESSMENT AND PLAN: Coronary artery disease: No angina reported.  Exertional dyspnea is relatively stable, brought on mostly by heat.  We will continue current medications for secondary prevention of CAD, including aspirin and rosuvastatin.  Chronic HFpEF: Mr. Stuckey appears euvolemic with NYHA class II symptoms.  I encouraged him to continue avoiding salt if possible.  He is tolerating dapagliflozin well,  which we will continue.  We will also keep him on his current doses of furosemide and losartan.  Hypertension: Blood pressure borderline elevated today but better on other recent checks.  No medication changes at this time.  Hyperlipidemia: LDL and triglycerides well controlled on last check in 04/2021.  Continue rosuvastatin 40 mg daily for target LDL less than 70.  Morbid obesity: BMI remains greater than 40, though Mr. Kincy is working on weight loss.  I encouraged him to continue losing weight through diet and exercise.  Tobacco use: Mr. Dempster continues to chew tobacco but is in the process of quitting.  I encouraged him to keep working on this.  Follow-up: Return to clinic in 1 year.  Nelva Bush, MD 01/23/2022 8:40 AM

## 2022-01-23 ENCOUNTER — Encounter: Payer: Self-pay | Admitting: Internal Medicine

## 2022-01-23 ENCOUNTER — Ambulatory Visit: Payer: BC Managed Care – PPO | Admitting: Internal Medicine

## 2022-01-23 VITALS — BP 130/76 | HR 64 | Ht 69.0 in | Wt 320.0 lb

## 2022-01-23 DIAGNOSIS — I10 Essential (primary) hypertension: Secondary | ICD-10-CM | POA: Diagnosis not present

## 2022-01-23 DIAGNOSIS — E785 Hyperlipidemia, unspecified: Secondary | ICD-10-CM | POA: Diagnosis not present

## 2022-01-23 DIAGNOSIS — Z720 Tobacco use: Secondary | ICD-10-CM

## 2022-01-23 DIAGNOSIS — I2510 Atherosclerotic heart disease of native coronary artery without angina pectoris: Secondary | ICD-10-CM

## 2022-01-23 DIAGNOSIS — I5032 Chronic diastolic (congestive) heart failure: Secondary | ICD-10-CM | POA: Diagnosis not present

## 2022-01-23 NOTE — Patient Instructions (Signed)
Medication Instructions:  ? ?Your physician recommends that you continue on your current medications as directed. Please refer to the Current Medication list given to you today. ? ?*If you need a refill on your cardiac medications before your next appointment, please call your pharmacy* ? ? ?Lab Work: ? ?None ordered ? ?Testing/Procedures: ? ?None ordered ? ? ?Follow-Up: ?At [facility], you and your health needs are our priority.  As part of our continuing mission to provide you with exceptional heart care, we have created designated Provider Care Teams.  These Care Teams include your primary Cardiologist (physician) and Advanced Practice Providers (APPs -  Physician Assistants and Nurse Practitioners) who all work together to provide you with the care you need, when you need it. ? ?We recommend signing up for the patient portal called "MyChart".  Sign up information is provided on this After Visit Summary.  MyChart is used to connect with patients for Virtual Visits (Telemedicine).  Patients are able to view lab/test results, encounter notes, upcoming appointments, etc.  Non-urgent messages can be sent to your provider as well.   ?To learn more about what you can do with MyChart, go to [email or web address].   ? ?Your next appointment:   ?1 year(s) ? ?The format for your next appointment:   ?In Person ? ?Provider:   ?You may see Christopher End, MD or one of the following Advanced Practice Providers on your designated Care Team:   ?Christopher Berge, NP ?Ryan Dunn, PA-C ?Cadence Furth, PA-C ? ?Important Information About Sugar ? ? ? ? ? ? ?

## 2022-02-08 ENCOUNTER — Other Ambulatory Visit: Payer: Self-pay | Admitting: Internal Medicine

## 2022-02-26 ENCOUNTER — Other Ambulatory Visit: Payer: Self-pay | Admitting: Family Medicine

## 2022-02-26 ENCOUNTER — Other Ambulatory Visit: Payer: Self-pay | Admitting: Family

## 2022-02-26 DIAGNOSIS — R051 Acute cough: Secondary | ICD-10-CM

## 2022-02-26 NOTE — Telephone Encounter (Signed)
Last office visit 12/26/21 for tinnitus.  Last refilled 09/02/21 for #15 with no refills.  CPE 07/03/22.

## 2022-03-18 ENCOUNTER — Other Ambulatory Visit: Payer: Self-pay | Admitting: Family Medicine

## 2022-03-18 NOTE — Telephone Encounter (Signed)
Last office visit 12/26/21 for tinnitus.  Last refilled 02/26/22 for #15 with no refills.  CPE scheduled for 07/03/22.

## 2022-04-10 ENCOUNTER — Telehealth: Payer: Self-pay | Admitting: Family Medicine

## 2022-04-10 NOTE — Telephone Encounter (Signed)
Patient called stating that he is experiencing a sinus infection,that he experiences every year around this time,and usually has to have an rx called in for him. He would like to know if anything can be called in for him without being seen or would he have to come in?

## 2022-04-10 NOTE — Telephone Encounter (Signed)
Please call.  He will need to be seen prior to medication being sent in.

## 2022-04-13 ENCOUNTER — Ambulatory Visit: Payer: Self-pay

## 2022-04-13 ENCOUNTER — Other Ambulatory Visit: Payer: Self-pay | Admitting: Family Medicine

## 2022-04-13 DIAGNOSIS — M25532 Pain in left wrist: Secondary | ICD-10-CM

## 2022-04-14 ENCOUNTER — Encounter: Payer: Self-pay | Admitting: Family Medicine

## 2022-04-14 ENCOUNTER — Ambulatory Visit: Payer: BC Managed Care – PPO | Admitting: Family Medicine

## 2022-04-14 VITALS — BP 100/60 | HR 51 | Temp 97.9°F | Ht 69.0 in | Wt 324.1 lb

## 2022-04-14 DIAGNOSIS — J0110 Acute frontal sinusitis, unspecified: Secondary | ICD-10-CM | POA: Insufficient documentation

## 2022-04-14 MED ORDER — GUAIFENESIN-CODEINE 100-10 MG/5ML PO SYRP: 5.0000 mL | ORAL_SOLUTION | Freq: Every evening | ORAL | 0 refills | Status: DC | PRN

## 2022-04-14 MED ORDER — AMOXICILLIN 500 MG PO CAPS: 1000.0000 mg | ORAL_CAPSULE | Freq: Two times a day (BID) | ORAL | 0 refills | Status: DC

## 2022-04-14 NOTE — Progress Notes (Signed)
Patient ID: Todd Owens, male    DOB: 1965/09/29, 56 y.o.   MRN: [id number]  This visit was conducted in person.  BP 100/60   Pulse (!) 51   Temp 97.9 F (36.6 C) (Oral)   Ht '5\' 9"'$  (1.753 m)   Wt (!) 324 lb 2 oz (147 kg)   SpO2 96%   BMI 47.86 kg/m    CC:  Chief Complaint  Patient presents with   Nasal Congestion    With green mucus   Headache    Sinus pressure over eyes   Cough    Raspy   Fever    X 1 day    Subjective:   HPI: Todd Owens is a 56 y.o. male presenting on 04/14/2022 for Nasal Congestion (With green mucus), Headache (Sinus pressure over eyes), Cough (Raspy), and Fever (X 1 day)   Date of onset: 04/04/22  Started with nasal congestion, facial pain Sinus pain and pressure across eye.  Cough, dry initially.. now productive of green mucus.  No SOB, no wheeze... some rattling in chest.  Bilateral ear draining, pressure, no pain.   No body ache 04/10/2022 fever, subjective Chills.      COVID 19 screen COVID testing: none COVID vaccine: none COVID exposure: No recent travel or known exposure to COVID19  The importance of social distancing was discussed today.   Relevant past medical, surgical, family and social history reviewed and updated as indicated. Interim medical history since our last visit reviewed. Allergies and medications reviewed and updated. Outpatient Medications Prior to Visit  Medication Sig Dispense Refill   aspirin EC (ASPIRIN LOW DOSE) 81 MG tablet TAKE 1 TABLET BY MOUTH EVERY DAY 90 tablet 0   diclofenac Sodium (VOLTAREN) 1 % GEL APPLY 4 GRAMS TOPICALLY 4 TIMES DAILY**ASPIRIN & NSAIDS ORALLY NOT RECOMMENDED GIVEN CARDIAC ISSUES 100 g 2   FARXIGA 10 MG TABS tablet TAKE 1 TABLET BY MOUTH DAILY BEFORE BREAKFAST. 30 tablet 10   fluticasone (FLONASE) 50 MCG/ACT nasal spray PLACE 1 SPRAY INTO BOTH NOSTRILS 2 (TWO) TIMES DAILY 16 mL 5   furosemide (LASIX) 40 MG tablet TAKE 1 TABLET BY MOUTH EVERY DAY 90 tablet 0   losartan (COZAAR)  100 MG tablet TAKE 1 TABLET BY MOUTH EVERY DAY 90 tablet 0   nitroGLYCERIN (NITROSTAT) 0.4 MG SL tablet Place 0.4 mg under the tongue every 5 (five) minutes as needed for chest pain.     omeprazole (PRILOSEC) 40 MG capsule TAKE 1 CAPSULE BY MOUTH EVERY DAY 90 capsule 0   rosuvastatin (CRESTOR) 40 MG tablet Take 1 tablet (40 mg total) by mouth daily. 90 tablet 1   albuterol (VENTOLIN HFA) 108 (90 Base) MCG/ACT inhaler TAKE 2 PUFFS BY MOUTH EVERY 6 HOURS AS NEEDED FOR WHEEZE OR SHORTNESS OF BREATH 18 each 0   cyclobenzaprine (FLEXERIL) 10 MG tablet TAKE 1 TABLET BY MOUTH AT BEDTIME AS NEEDED FOR MUSCLE SPASMS. 15 tablet 0   loratadine (CLARITIN) 10 MG tablet Take 1 tablet (10 mg total) by mouth daily. 30 tablet 11   No facility-administered medications prior to visit.     Per HPI unless specifically indicated in ROS section below Review of Systems Objective:  BP 100/60   Pulse (!) 51   Temp 97.9 F (36.6 C) (Oral)   Ht '5\' 9"'$  (1.753 m)   Wt (!) 324 lb 2 oz (147 kg)   SpO2 96%   BMI 47.86 kg/m   Wt Readings from Last  3 Encounters:  04/14/22 (!) 324 lb 2 oz (147 kg)  01/23/22 (!) 320 lb (145.2 kg)  01/08/22 (!) 323 lb (146.5 kg)      Physical Exam Constitutional:      Appearance: He is well-developed.  HENT:     Head: Normocephalic.     Right Ear: Hearing, tympanic membrane, ear canal and external ear normal.     Left Ear: Hearing, tympanic membrane, ear canal and external ear normal.     Nose:     Right Turbinates: Swollen.     Left Turbinates: Swollen.     Right Sinus: Frontal sinus tenderness present.     Left Sinus: Frontal sinus tenderness present.  Neck:     Thyroid: No thyroid mass or thyromegaly.     Vascular: No carotid bruit.     Trachea: Trachea normal.  Cardiovascular:     Rate and Rhythm: Normal rate and regular rhythm.     Pulses: Normal pulses.     Heart sounds: Heart sounds not distant. No murmur heard.    No friction rub. No gallop.     Comments: No  peripheral edema Pulmonary:     Effort: Pulmonary effort is normal. No respiratory distress.     Breath sounds: Normal breath sounds.  Skin:    General: Skin is warm and dry.     Findings: No rash.  Psychiatric:        Speech: Speech normal.        Behavior: Behavior normal.        Thought Content: Thought content normal.       Results for orders placed or performed during the hospital encounter of 01/08/22  CBC with Differential  Result Value Ref Range   WBC 6.6 4.0 - 10.5 K/uL   RBC 3.93 (L) 4.22 - 5.81 MIL/uL   Hemoglobin 12.8 (L) 13.0 - 17.0 g/dL   HCT 38.1 (L) 39.0 - 52.0 %   MCV 96.9 80.0 - 100.0 fL   MCH 32.6 26.0 - 34.0 pg   MCHC 33.6 30.0 - 36.0 g/dL   RDW 13.7 11.5 - 15.5 %   Platelets 203 150 - 400 K/uL   nRBC 0.0 0.0 - 0.2 %   Neutrophils Relative % 63 %   Neutro Abs 4.2 1.7 - 7.7 K/uL   Lymphocytes Relative 28 %   Lymphs Abs 1.8 0.7 - 4.0 K/uL   Monocytes Relative 7 %   Monocytes Absolute 0.4 0.1 - 1.0 K/uL   Eosinophils Relative 1 %   Eosinophils Absolute 0.1 0.0 - 0.5 K/uL   Basophils Relative 1 %   Basophils Absolute 0.0 0.0 - 0.1 K/uL   Immature Granulocytes 0 %   Abs Immature Granulocytes 0.02 0.00 - 0.07 K/uL  Basic metabolic panel  Result Value Ref Range   Sodium 137 135 - 145 mmol/L   Potassium 3.3 (L) 3.5 - 5.1 mmol/L   Chloride 102 98 - 111 mmol/L   CO2 27 22 - 32 mmol/L   Glucose, Bld 174 (H) 70 - 99 mg/dL   BUN 9 6 - 20 mg/dL   Creatinine, Ser 1.04 0.61 - 1.24 mg/dL   Calcium 9.0 8.9 - 10.3 mg/dL   GFR, Estimated >60 >60 mL/min   Anion gap 8 5 - 15     COVID 19 screen:  No recent travel or known exposure to COVID19 The patient denies respiratory symptoms of COVID 19 at this time. The importance of social distancing was discussed today.  Assessment and Plan    Problem List Items Addressed This Visit     Acute frontal sinusitis - Primary     Nasal saline, Flonase 2 sprays per nostril daily.  Given not improving after 7 to 10  days,concerning for bacterial super infeciton. Complete course of antibiotics.  Cough suppressant at night as needed for cough.      Relevant Medications   amoxicillin (AMOXIL) 500 MG capsule   guaiFENesin-codeine (ROBITUSSIN AC) 100-10 MG/5ML syrup   Meds ordered this encounter  Medications   amoxicillin (AMOXIL) 500 MG capsule    Sig: Take 2 capsules (1,000 mg total) by mouth 2 (two) times daily.    Dispense:  40 capsule    Refill:  0   guaiFENesin-codeine (ROBITUSSIN AC) 100-10 MG/5ML syrup    Sig: Take 5-10 mLs by mouth at bedtime as needed for cough.    Dispense:  180 mL    Refill:  0     Eliezer Lofts, MD

## 2022-04-14 NOTE — Assessment & Plan Note (Addendum)
Nasal saline, Flonase 2 sprays per nostril daily.  Given not improving after 7 to 10 days,concerning for bacterial super infeciton. Complete course of antibiotics.  Cough suppressant at night as needed for cough.

## 2022-04-15 ENCOUNTER — Telehealth: Payer: Self-pay | Admitting: Family Medicine

## 2022-04-15 NOTE — Telephone Encounter (Signed)
Let patient and wife know that is correct!  Amoxicillin 500 mg 2 capsules twice daily x 10 day #40

## 2022-04-15 NOTE — Telephone Encounter (Signed)
Pt wife called with concerns about amoxicillin (AMOXIL) 500 MG capsule that was called in yesterday for pt. The frequency is for 2 tablets twice a day, each tablet being 500 mg. Pt's wife is asking Is that correct? Asking because that'd be 2,000 mg in 1 day. [phone](TD'D #) [phone] (wife's #)

## 2022-04-15 NOTE — Telephone Encounter (Signed)
Mr. Cline notified by telephone that the amoxicillin prescribed yesterday is the correct dose.   Patient states understanding.

## 2022-05-15 ENCOUNTER — Other Ambulatory Visit: Payer: Self-pay | Admitting: Family Medicine

## 2022-05-24 IMAGING — CT CT CHEST W/O CM
2 of 3 series · 15 of 36 positions shown, 18 images · non-contrast
Comparison: Prior chest x-ray 12/13/2020; prior CT scan of the
heart 01/09/2021

CLINICAL DATA: Abnormal chest x-ray, pulmonary nodule

EXAM:
CT CHEST WITHOUT CONTRAST
TECHNIQUE: Multidetector CT imaging of the chest was performed following the
standard protocol without IV contrast.

[Series 2: thorax · axial · 0.78mm/px · z∈[-362,-86]mm · 12 of 163 slices shown, 15 images]
[im 13/163  mediastinal]
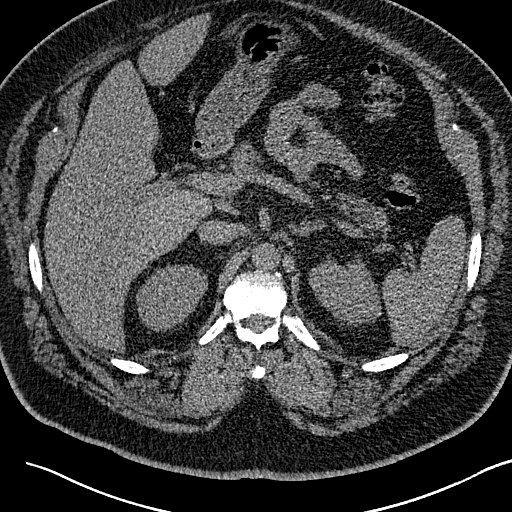
[im 13/163  lung]
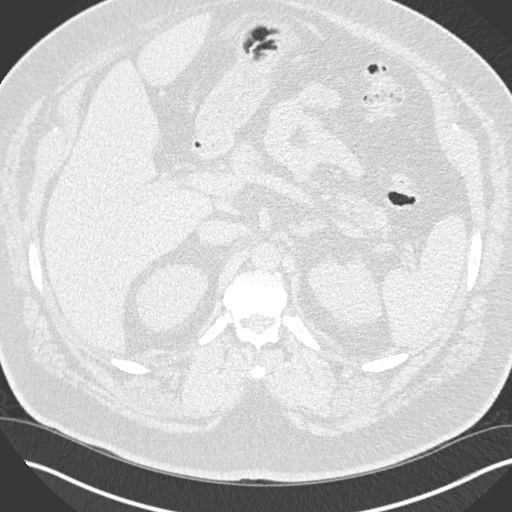
[im 25/163  lung]
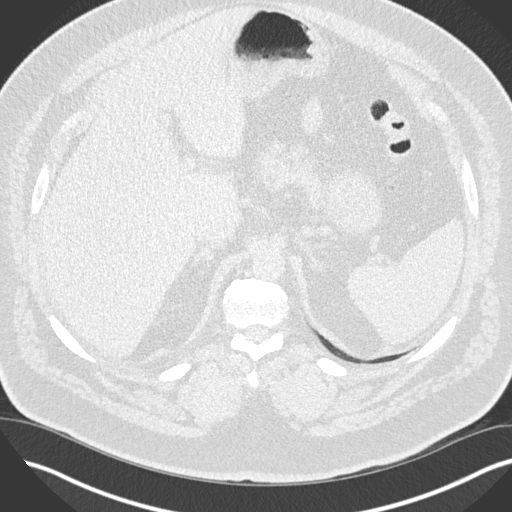
[im 37/163  lung]
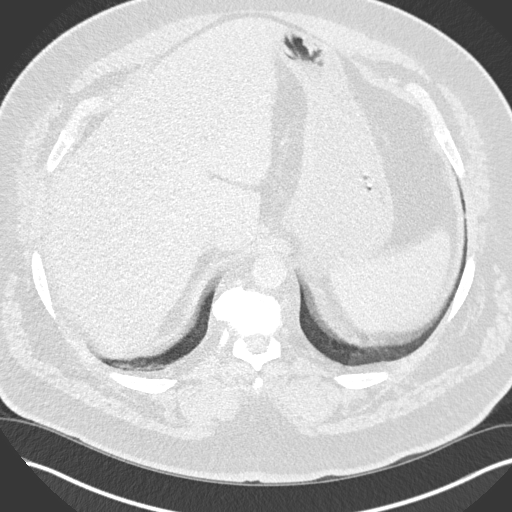
[im 49/163  lung]
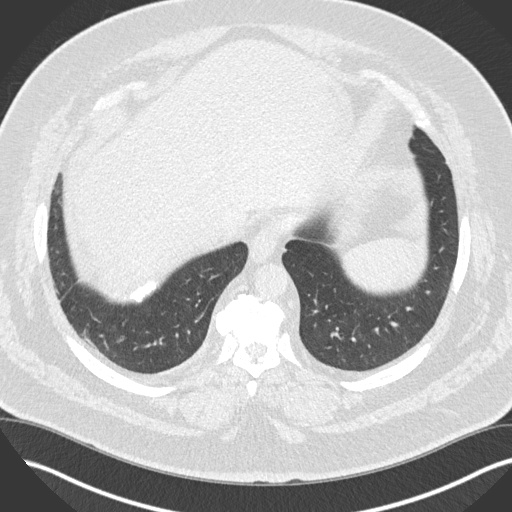
[im 61/163  mediastinal]
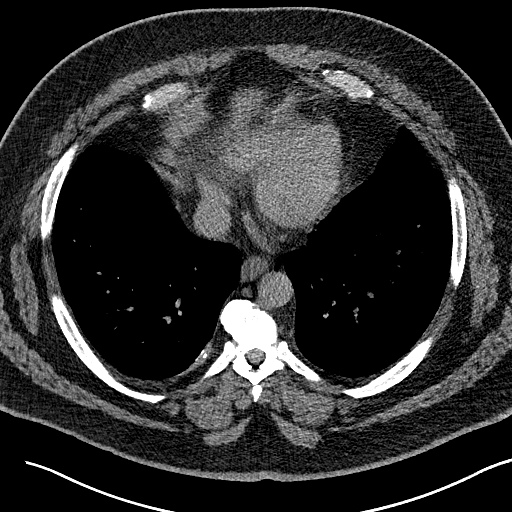
[im 61/163  lung]
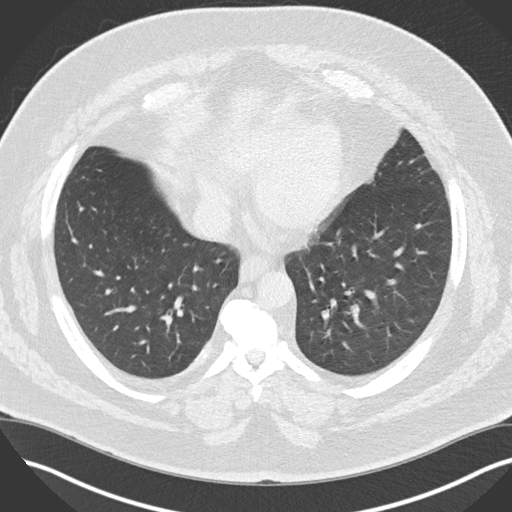
[im 73/163  lung]
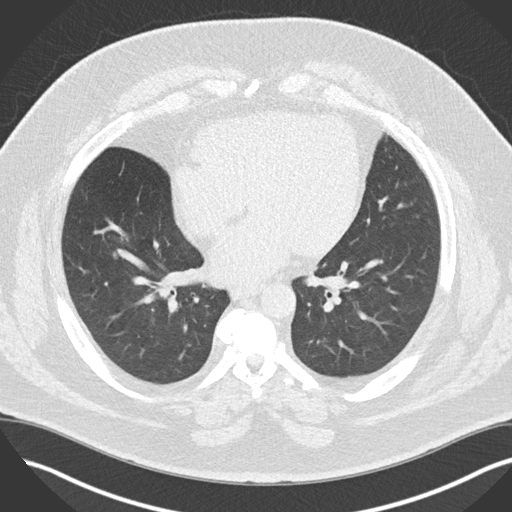
[im 91/163  lung]
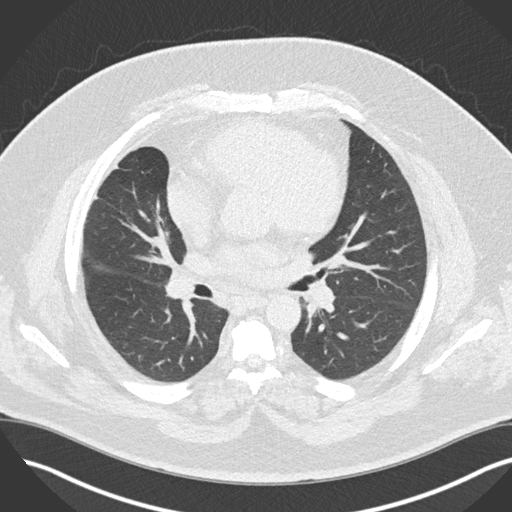
[im 103/163  lung]
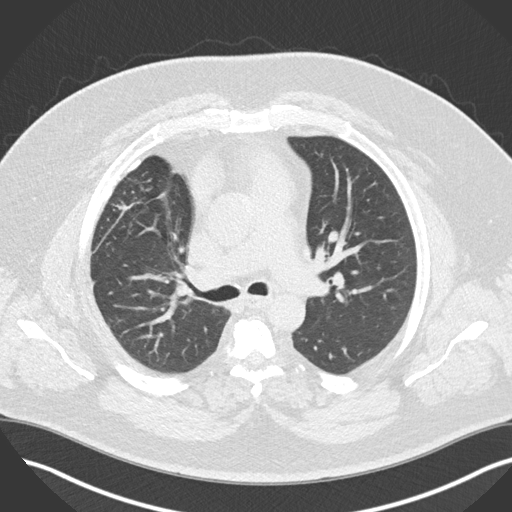
[im 115/163  mediastinal]
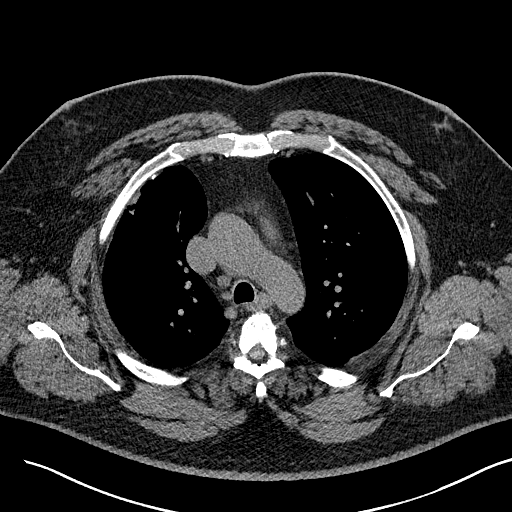
[im 115/163  lung]
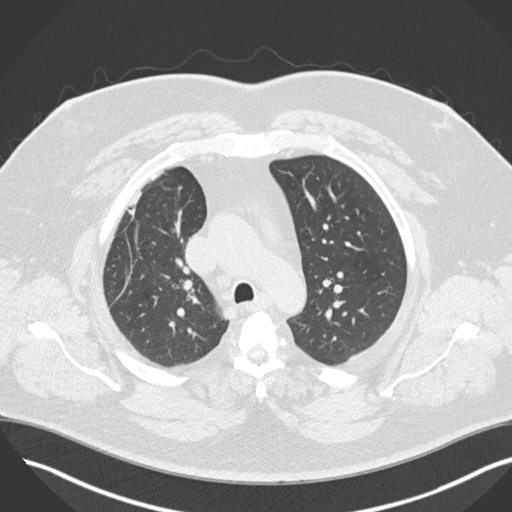
[im 127/163  lung]
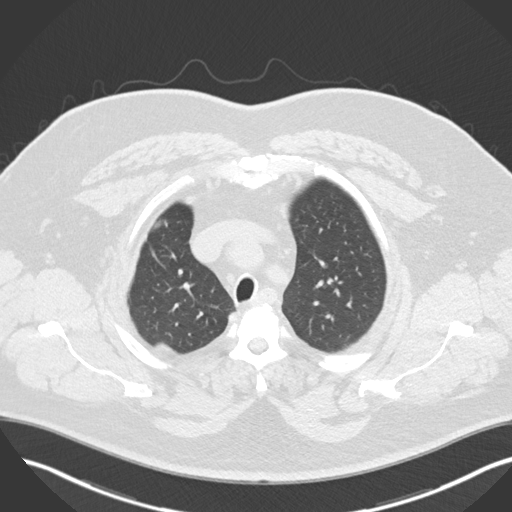
[im 139/163  lung]
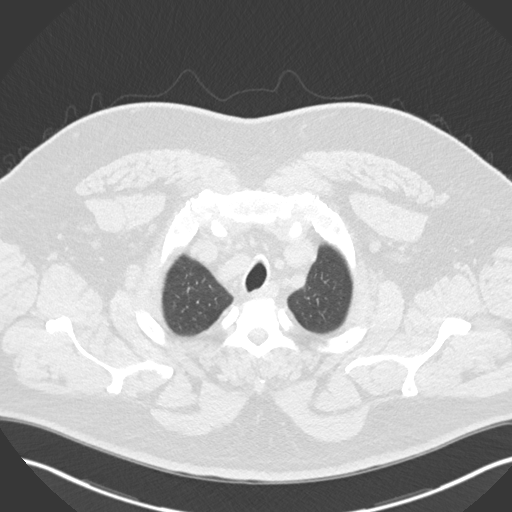
[im 151/163  lung]
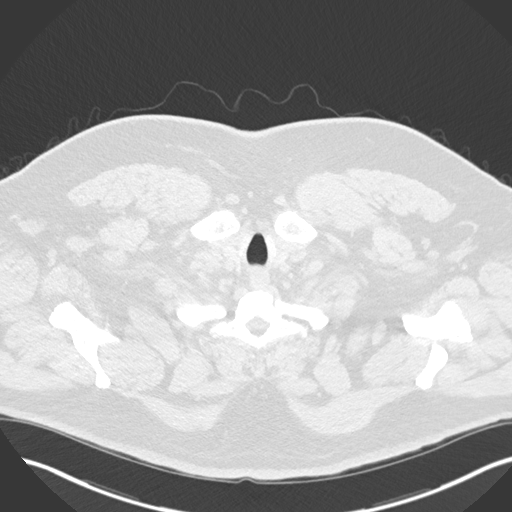

[Series 5: coronal · coronal · 0.66mm/px · 3 of 159 slices shown]
[im 32/159  lung]
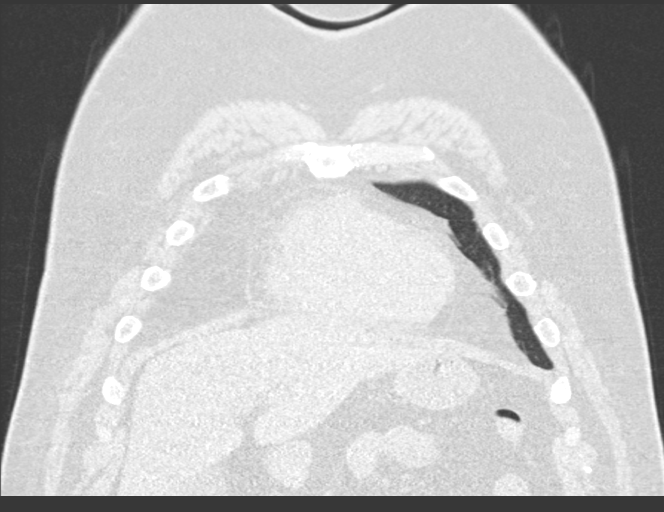
[im 64/159  lung]
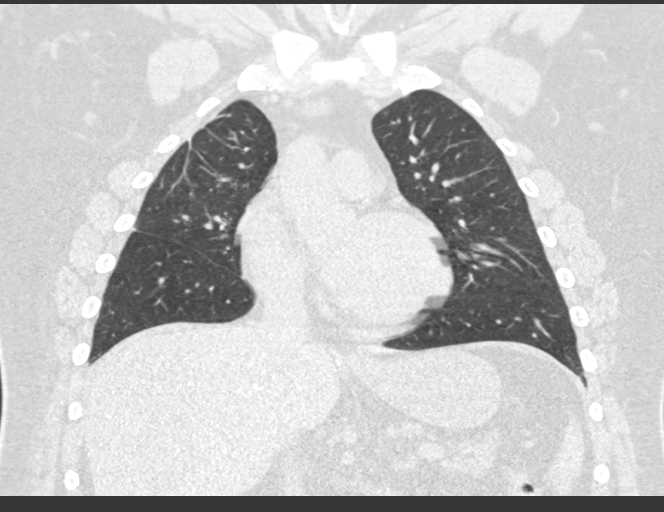
[im 95/159  lung]
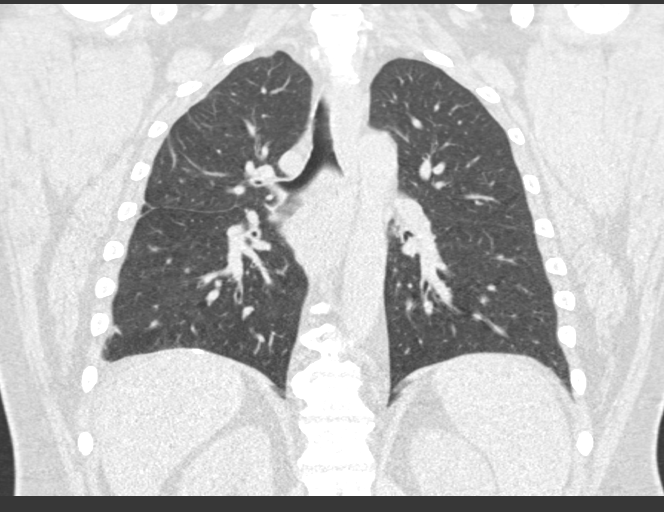

[15 of 36 positions shown; findings below may reference images not displayed]

FINDINGS: Cardiovascular: Limited evaluation in the absence of intravenous
contrast. Two vessel aortic arch. The right brachiocephalic and left
common carotid artery share a common origin. Trace atherosclerotic
calcifications along the proximal left subclavian artery. Extensive
atherosclerotic calcifications throughout the coronary arteries. The
heart is normal in size. No pericardial effusion. No evidence of
aortic aneurysm.

Mediastinum/Nodes: Unremarkable CT appearance of the thyroid gland.
No suspicious mediastinal or hilar adenopathy. No soft tissue
mediastinal mass. The thoracic esophagus is unremarkable.

Lungs/Pleura: Calcifications along the anterior and inferior right
pleural surface. No suspicious pulmonary nodules. The structure
question is a possible 8 mm central left upper lobe pulmonary nodule
represents a pulmonary arterial branch. No pneumothorax or pleural
effusion.

Upper Abdomen: No acute abnormality.

Musculoskeletal: No chest wall mass or suspicious bone lesions
identified.
IMPRESSION: 1. No evidence of pulmonary nodule. The region of concern on the
prior heart CT scan can be confidently identified as a pulmonary
arterial branch on this dedicated chest CT.
2. Stable calcified pleural plaques on the right.
3. Coronary artery calcifications.

## 2022-07-03 ENCOUNTER — Encounter: Payer: Self-pay | Admitting: Family Medicine

## 2022-07-03 ENCOUNTER — Ambulatory Visit (INDEPENDENT_AMBULATORY_CARE_PROVIDER_SITE_OTHER): Payer: BC Managed Care – PPO | Admitting: Family Medicine

## 2022-07-03 VITALS — BP 130/70 | HR 68 | Temp 98.3°F | Ht 69.5 in | Wt 334.0 lb

## 2022-07-03 DIAGNOSIS — Z0000 Encounter for general adult medical examination without abnormal findings: Secondary | ICD-10-CM

## 2022-07-03 DIAGNOSIS — I10 Essential (primary) hypertension: Secondary | ICD-10-CM

## 2022-07-03 DIAGNOSIS — I5032 Chronic diastolic (congestive) heart failure: Secondary | ICD-10-CM

## 2022-07-03 DIAGNOSIS — G4733 Obstructive sleep apnea (adult) (pediatric): Secondary | ICD-10-CM

## 2022-07-03 DIAGNOSIS — R7303 Prediabetes: Secondary | ICD-10-CM

## 2022-07-03 DIAGNOSIS — Z125 Encounter for screening for malignant neoplasm of prostate: Secondary | ICD-10-CM

## 2022-07-03 DIAGNOSIS — E785 Hyperlipidemia, unspecified: Secondary | ICD-10-CM

## 2022-07-03 MED ORDER — TADALAFIL 5 MG PO TABS: 5.0000 mg | ORAL_TABLET | Freq: Every day | ORAL | 11 refills | Status: DC | PRN

## 2022-07-03 NOTE — Patient Instructions (Signed)
Please stop at the lab to have labs drawn.  

## 2022-07-03 NOTE — Progress Notes (Signed)
Patient ID: DAM ASHRAF, male    DOB: [birth year], 56 y.o.   MRN: [id number]  This visit was conducted in person.  BP 130/70   Pulse 68   Temp 98.3 F (36.8 C) (Oral)   Ht 5' 9.5" (1.765 m)   Wt (!) 334 lb (151.5 kg)   SpO2 96%   BMI 48.62 kg/m    CC:  Chief Complaint  Patient presents with   Annual Exam    Subjective:   HPI: Todd Owens is a 56 y.o. male presenting on 07/03/2022 for Annual Exam  The patient presents for  complete physical and review of chronic health problems. He/She also has the following acute concerns today:  Hypertension:  At goal on losartan and Maxide BP Readings from Last 3 Encounters:  07/03/22 130/70  04/14/22 100/60  01/23/22 130/76  Using medication without problems or lightheadedness:  Chest pain with exertion: Edema: Short of breath: Average home BPs: Other issues: CAD  Chronic heart failure with preserved ejection fraction: Reviewed last office visit from Dr. Saunders Revel January 23, 2022 Had side effects to Trulicity so changed to dapagliflozin to assist with management of heart failure.  Prediabetes and cholesterol: Due for reevaluation on rosuvastatin 40 mg daily with history of coronary artery disease.  Followed by cardiologist. Lab Results  Component Value Date   HGBA1C 5.9 04/25/2021   Lab Results  Component Value Date   CHOL 104 04/25/2021   HDL 27.60 (L) 04/25/2021   LDLCALC 61 04/25/2021   TRIG 81.0 04/25/2021   CHOLHDL 4 04/25/2021   Obstructive sleep apnea on CPAP  Left upper lobe pulmonary nodule noted on July 30, 2020.Marland Kitchen  Repeat chest CT showed no evidence of pulmonary nodule.  The region of concern on the prior heart CT scan was definitively identified as pulmonary arterial branch.  Morbid obesity: Did not tolerate GLP1 Trulicity. Wt Readings from Last 3 Encounters:  07/03/22 (!) 334 lb (151.5 kg)  04/14/22 (!) 324 lb 2 oz (147 kg)  01/23/22 (!) 320 lb (145.2 kg)     Relevant past medical, surgical, family  and social history reviewed and updated as indicated. Interim medical history since our last visit reviewed. Allergies and medications reviewed and updated. Outpatient Medications Prior to Visit  Medication Sig Dispense Refill   ASPIRIN LOW DOSE 81 MG tablet TAKE 1 TABLET BY MOUTH EVERY DAY 90 tablet 0   diclofenac Sodium (VOLTAREN) 1 % GEL APPLY 4 GRAMS TOPICALLY 4 TIMES DAILY**ASPIRIN & NSAIDS ORALLY NOT RECOMMENDED GIVEN CARDIAC ISSUES 100 g 2   FARXIGA 10 MG TABS tablet TAKE 1 TABLET BY MOUTH DAILY BEFORE BREAKFAST. 30 tablet 10   fluticasone (FLONASE) 50 MCG/ACT nasal spray PLACE 1 SPRAY INTO BOTH NOSTRILS 2 (TWO) TIMES DAILY 16 mL 5   furosemide (LASIX) 40 MG tablet TAKE 1 TABLET BY MOUTH EVERY DAY 90 tablet 0   losartan (COZAAR) 100 MG tablet TAKE 1 TABLET BY MOUTH EVERY DAY 90 tablet 0   nitroGLYCERIN (NITROSTAT) 0.4 MG SL tablet Place 0.4 mg under the tongue every 5 (five) minutes as needed for chest pain.     omeprazole (PRILOSEC) 40 MG capsule TAKE 1 CAPSULE BY MOUTH EVERY DAY 90 capsule 0   rosuvastatin (CRESTOR) 40 MG tablet TAKE 1 TABLET BY MOUTH EVERY DAY 90 tablet 0   amoxicillin (AMOXIL) 500 MG capsule Take 2 capsules (1,000 mg total) by mouth 2 (two) times daily. 40 capsule 0   guaiFENesin-codeine (ROBITUSSIN AC) 100-10 MG/5ML  syrup Take 5-10 mLs by mouth at bedtime as needed for cough. 180 mL 0   No facility-administered medications prior to visit.     Per HPI unless specifically indicated in ROS section below Review of Systems  Constitutional:  Negative for fatigue and fever.  HENT:  Negative for ear pain.   Eyes:  Negative for pain.  Respiratory:  Negative for cough and shortness of breath.   Cardiovascular:  Negative for chest pain, palpitations and leg swelling.  Gastrointestinal:  Negative for abdominal pain.  Genitourinary:  Negative for dysuria.  Musculoskeletal:  Negative for arthralgias.  Neurological:  Negative for syncope, light-headedness and headaches.   Psychiatric/Behavioral:  Negative for dysphoric mood.    Objective:  BP 130/70   Pulse 68   Temp 98.3 F (36.8 C) (Oral)   Ht 5' 9.5" (1.765 m)   Wt (!) 334 lb (151.5 kg)   SpO2 96%   BMI 48.62 kg/m   Wt Readings from Last 3 Encounters:  07/03/22 (!) 334 lb (151.5 kg)  04/14/22 (!) 324 lb 2 oz (147 kg)  01/23/22 (!) 320 lb (145.2 kg)      Physical Exam Constitutional:      General: He is not in acute distress.    Appearance: Normal appearance. He is well-developed. He is not ill-appearing or toxic-appearing.  HENT:     Head: Normocephalic and atraumatic.     Right Ear: Hearing, tympanic membrane, ear canal and external ear normal.     Left Ear: Hearing, tympanic membrane, ear canal and external ear normal.     Nose: Nose normal.     Mouth/Throat:     Pharynx: Uvula midline.  Eyes:     General: Lids are normal. Lids are everted, no foreign bodies appreciated.     Conjunctiva/sclera: Conjunctivae normal.     Pupils: Pupils are equal, round, and reactive to light.  Neck:     Thyroid: No thyroid mass or thyromegaly.     Vascular: No carotid bruit.     Trachea: Trachea and phonation normal.  Cardiovascular:     Rate and Rhythm: Normal rate and regular rhythm.     Pulses: Normal pulses.     Heart sounds: S1 normal and S2 normal. No murmur heard.    No gallop.  Pulmonary:     Breath sounds: Normal breath sounds. No wheezing, rhonchi or rales.  Abdominal:     General: Bowel sounds are normal.     Palpations: Abdomen is soft.     Tenderness: There is no abdominal tenderness. There is no guarding or rebound.     Hernia: No hernia is present.  Musculoskeletal:     Cervical back: Normal range of motion and neck supple.  Lymphadenopathy:     Cervical: No cervical adenopathy.  Skin:    General: Skin is warm and dry.     Findings: No rash.  Neurological:     Mental Status: He is alert.     Cranial Nerves: No cranial nerve deficit.     Sensory: No sensory deficit.      Gait: Gait normal.     Deep Tendon Reflexes: Reflexes are normal and symmetric.  Psychiatric:        Speech: Speech normal.        Behavior: Behavior normal.        Judgment: Judgment normal.       Results for orders placed or performed during the hospital encounter of 01/08/22  CBC with Differential  Result Value Ref Range   WBC 6.6 4.0 - 10.5 K/uL   RBC 3.93 (L) 4.22 - 5.81 MIL/uL   Hemoglobin 12.8 (L) 13.0 - 17.0 g/dL   HCT 38.1 (L) 39.0 - 52.0 %   MCV 96.9 80.0 - 100.0 fL   MCH 32.6 26.0 - 34.0 pg   MCHC 33.6 30.0 - 36.0 g/dL   RDW 13.7 11.5 - 15.5 %   Platelets 203 150 - 400 K/uL   nRBC 0.0 0.0 - 0.2 %   Neutrophils Relative % 63 %   Neutro Abs 4.2 1.7 - 7.7 K/uL   Lymphocytes Relative 28 %   Lymphs Abs 1.8 0.7 - 4.0 K/uL   Monocytes Relative 7 %   Monocytes Absolute 0.4 0.1 - 1.0 K/uL   Eosinophils Relative 1 %   Eosinophils Absolute 0.1 0.0 - 0.5 K/uL   Basophils Relative 1 %   Basophils Absolute 0.0 0.0 - 0.1 K/uL   Immature Granulocytes 0 %   Abs Immature Granulocytes 0.02 0.00 - 0.07 K/uL  Basic metabolic panel  Result Value Ref Range   Sodium 137 135 - 145 mmol/L   Potassium 3.3 (L) 3.5 - 5.1 mmol/L   Chloride 102 98 - 111 mmol/L   CO2 27 22 - 32 mmol/L   Glucose, Bld 174 (H) 70 - 99 mg/dL   BUN 9 6 - 20 mg/dL   Creatinine, Ser 1.04 0.61 - 1.24 mg/dL   Calcium 9.0 8.9 - 10.3 mg/dL   GFR, Estimated >60 >60 mL/min   Anion gap 8 5 - 15     COVID 19 screen:  No recent travel or known exposure to COVID19 The patient denies respiratory symptoms of COVID 19 at this time. The importance of social distancing was discussed today.   Assessment and Plan The patient's preventative maintenance and recommended screening tests for an annual wellness exam were reviewed in full today. Brought up to date unless services declined.  Counselled on the importance of diet, exercise, and its role in overall health and mortality. The patient's FH and SH was reviewed,  including their home life, tobacco status, and drug and alcohol status.   Vaccine: Td 2014.  Discussed COVID19 vaccine side effects and benefits. Strongly encouraged the patient to get the vaccine. Questions answered. Consider shingrix. Former smoker, Quit 15-20 years ago Prostate, DUE Colon cancer screen:  Tubular adenoma 02/18/2018.. plan repeat in 5  years, Dr. Silverio Decamp.  No STD testing requested. WPY:[id number]  Hep C:not indicated.    Routine general medical examination at a health care facility  Chronic heart failure with preserved ejection fraction (HFpEF) [facility]) Assessment & Plan: Chronic, euvolemic in office today Reviewed last office visit from Dr. Saunders Revel January 23, 2022 Had side effects to Trulicity so changed to dapagliflozin to assist with management of heart failure.   Essential hypertension Assessment & Plan: Stable, chronic.  Continue current medication.   Losartan and HCTZ   Obstructive sleep apnea Assessment & Plan: CPAP.   Hyperlipidemia LDL goal <70 Assessment & Plan: Due for reevaluation on rosuvastatin 40 mg daily with history of coronary artery disease.    Orders: -     Lipid panel -     Comprehensive metabolic panel  Prediabetes -     Hemoglobin A1c  Prostate cancer screening -     PSA  Morbid obesity ([place]) Assessment & Plan: Encouraged exercise, weight loss, healthy eating habits.  Did not tolerate GLP1 Trulicity.   Other orders -  Tadalafil; Take 1-4 tablets (5-20 mg total) by mouth daily as needed for erectile dysfunction.  Dispense: 30 tablet; Refill: [place], Todd Owens

## 2022-07-04 LAB — COMPREHENSIVE METABOLIC PANEL
AG Ratio: 1.4 (calc) (ref 1.0–2.5)
ALT: 17 U/L (ref 9–46)
AST: 22 U/L (ref 10–35)
Albumin: 4.2 g/dL (ref 3.6–5.1)
Alkaline phosphatase (APISO): 61 U/L (ref 35–144)
BUN: 16 mg/dL (ref 7–25)
CO2: 27 mmol/L (ref 20–32)
Calcium: 9.1 mg/dL (ref 8.6–10.3)
Chloride: 102 mmol/L (ref 98–110)
Creat: 1.14 mg/dL (ref 0.70–1.30)
Globulin: 2.9 g/dL (calc) (ref 1.9–3.7)
Glucose, Bld: 98 mg/dL (ref 65–99)
Potassium: 4.1 mmol/L (ref 3.5–5.3)
Sodium: 141 mmol/L (ref 135–146)
Total Bilirubin: 0.4 mg/dL (ref 0.2–1.2)
Total Protein: 7.1 g/dL (ref 6.1–8.1)

## 2022-07-04 LAB — LIPID PANEL
Cholesterol: 116 mg/dL (ref <200)
HDL: 36 mg/dL — ABNORMAL LOW (ref >=40)
LDL Cholesterol (Calc): 61 mg/dL (calc)
Non-HDL Cholesterol (Calc): 80 mg/dL (calc) (ref <130)
Total CHOL/HDL Ratio: 3.2 (calc) (ref <5.0)
Triglycerides: 103 mg/dL (ref <150)

## 2022-07-04 LAB — HEMOGLOBIN A1C
Hgb A1c MFr Bld: 6.3 % of total Hgb — ABNORMAL HIGH (ref <5.7)
Mean Plasma Glucose: 134 mg/dL
eAG (mmol/L): 7.4 mmol/L

## 2022-07-04 LAB — PSA: PSA: 0.33 ng/mL (ref <=4.00)

## 2022-07-07 ENCOUNTER — Telehealth: Payer: Self-pay

## 2022-07-07 NOTE — Telephone Encounter (Signed)
Patient aware Cialis is not covered by his insurance.  He will use GoodRx coupon and pay out of pocket.

## 2022-07-07 NOTE — Telephone Encounter (Signed)
Pharmacy Patient Advocate Encounter   Received notification from [facility] that prior authorization for Tadalafil '5mg'$   is required/requested.    PA submitted on 12.19.23 to (ins) [employer]  via CoverMyMeds Key [id number] Status is pending

## 2022-07-07 NOTE — Telephone Encounter (Signed)
Pharmacy Patient Advocate Encounter  Received notification from [facility] that the request for prior authorization for Tadalafil '5mg'$  has been denied due to .

## 2022-07-22 ENCOUNTER — Encounter (HOSPITAL_COMMUNITY): Payer: Self-pay | Admitting: Orthopedic Surgery

## 2022-07-22 NOTE — Progress Notes (Addendum)
COVID Vaccine Completed:  Date of COVID positive in last 90 days:  PCP - Eliezer Lofts, MD Cardiologist - Nelva Bush, MD (LOV 01/23/22)  Chest x-ray -  EKG - 09/26/21 Epic Stress Test - 08/10/16 Epic ECHO - 09/20/19 Epic Cardiac Cath - 02/21/21 Epic Coronary CT- 01/09/21 Epic Pacemaker/ICD device last checked: Spinal Cord Stimulator:  Bowel Prep -   Sleep Study -  CPAP -   Fasting Blood Sugar -  Checks Blood Sugar _____ times a day  Last dose of GLP1 agonist-  N/A GLP1 instructions:  N/A   Last dose of SGLT-2 inhibitors-  N/A SGLT-2 instructions: N/A   Blood Thinner Instructions: Aspirin Instructions: ASA 81 Last Dose:  Activity level:  Can go up a flight of stairs and perform activities of daily living without stopping and without symptoms of chest pain or shortness of breath.  Able to exercise without symptoms  Unable to go up a flight of stairs without symptoms of     Anesthesia review: CAD, HTN, CHF, OSA, preDM  Patient denies shortness of breath, fever, cough and chest pain at PAT appointment  Patient verbalized understanding of instructions that were given to them at the PAT appointment. Patient was also instructed that they will need to review over the PAT instructions again at home before surgery.

## 2022-07-22 NOTE — Progress Notes (Signed)
Please place orders for PAT appointment scheduled 07/23/21.

## 2022-07-23 ENCOUNTER — Encounter (HOSPITAL_COMMUNITY)
Admission: RE | Admit: 2022-07-23 | Discharge: 2022-07-23 | Disposition: A | Discharge status: Home / Self Care | Payer: No Typology Code available for payment source | Source: Ambulatory Visit | Attending: Orthopedic Surgery | Admitting: Orthopedic Surgery

## 2022-07-23 ENCOUNTER — Encounter (HOSPITAL_COMMUNITY): Payer: Self-pay

## 2022-07-23 VITALS — BP 144/79 | HR 74 | Temp 98.9°F | Resp 16 | Ht 69.0 in | Wt 333.0 lb

## 2022-07-23 DIAGNOSIS — Z87891 Personal history of nicotine dependence: Secondary | ICD-10-CM | POA: Diagnosis not present

## 2022-07-23 DIAGNOSIS — I5032 Chronic diastolic (congestive) heart failure: Secondary | ICD-10-CM | POA: Insufficient documentation

## 2022-07-23 DIAGNOSIS — R7303 Prediabetes: Secondary | ICD-10-CM | POA: Diagnosis not present

## 2022-07-23 DIAGNOSIS — I2510 Atherosclerotic heart disease of native coronary artery without angina pectoris: Secondary | ICD-10-CM | POA: Diagnosis not present

## 2022-07-23 DIAGNOSIS — M19011 Primary osteoarthritis, right shoulder: Secondary | ICD-10-CM | POA: Insufficient documentation

## 2022-07-23 DIAGNOSIS — M94211 Chondromalacia, right shoulder: Secondary | ICD-10-CM | POA: Insufficient documentation

## 2022-07-23 DIAGNOSIS — M25811 Other specified joint disorders, right shoulder: Secondary | ICD-10-CM | POA: Insufficient documentation

## 2022-07-23 DIAGNOSIS — I110 Hypertensive heart disease with heart failure: Secondary | ICD-10-CM | POA: Diagnosis not present

## 2022-07-23 DIAGNOSIS — I10 Essential (primary) hypertension: Secondary | ICD-10-CM

## 2022-07-23 DIAGNOSIS — Z01812 Encounter for preprocedural laboratory examination: Secondary | ICD-10-CM | POA: Diagnosis not present

## 2022-07-23 HISTORY — DX: Syncope and collapse: R55

## 2022-07-23 HISTORY — DX: Unspecified osteoarthritis, unspecified site: M19.90

## 2022-07-23 LAB — CBC
HCT: 36.5 % — ABNORMAL LOW (ref 39.0–52.0)
Hemoglobin: 12.8 g/dL — ABNORMAL LOW (ref 13.0–17.0)
MCH: 33.9 pg (ref 26.0–34.0)
MCHC: 35.1 g/dL (ref 30.0–36.0)
MCV: 96.6 fL (ref 80.0–100.0)
Platelets: 213 10*3/uL (ref 150–400)
RBC: 3.78 MIL/uL — ABNORMAL LOW (ref 4.22–5.81)
RDW: 13.6 % (ref 11.5–15.5)
WBC: 5.2 10*3/uL (ref 4.0–10.5)
nRBC: 0 % (ref 0.0–0.2)

## 2022-07-23 NOTE — Patient Instructions (Addendum)
SURGICAL WAITING ROOM VISITATION  Patients having surgery or a procedure may have no more than 2 support people in the waiting area - these visitors may rotate.    Children under the age of 66 must have an adult with them who is not the patient.  Due to an increase in RSV and influenza rates and associated hospitalizations, children ages 76 and under may not visit patients in [place].  If the patient needs to stay at the hospital during part of their recovery, the visitor guidelines for inpatient rooms apply. Pre-op nurse will coordinate an appropriate time for 1 support person to accompany patient in pre-op.  This support person may not rotate.    Please refer to the [facility] website for the visitor guidelines for Inpatients (after your surgery is over and you are in a regular room).    Your procedure is scheduled on: 07/24/22   Report to [facility] Main Entrance    Report to admitting at 11:00 AM   Call this number if you have problems the morning of surgery [phone]   Do not eat food :After Midnight.   After Midnight you may have the following liquids until 10:15 AM DAY OF SURGERY  Water Non-Citrus Juices (without pulp, NO RED-Apple, White grape, White cranberry) Black Coffee (NO MILK/CREAM OR CREAMERS, sugar ok)  Clear Tea (NO MILK/CREAM OR CREAMERS, sugar ok) regular and decaf                             Plain Jell-O (NO RED)                                           Fruit ices (not with fruit pulp, NO RED)                                     Popsicles (NO RED)                                                               Sports drinks like Gatorade (NO RED)               The day of surgery:  Drink ONE (1) Pre-Surgery Clear Ensure at 10:15 AM the morning of surgery. Drink in one sitting. Do not sip.  This drink was given to you during your hospital  pre-op appointment visit. Nothing else to drink after completing the  Pre-Surgery Clear Ensure.           If you have questions, please contact your surgeon's office.   FOLLOW BOWEL PREP AND ANY ADDITIONAL PRE OP INSTRUCTIONS YOU RECEIVED FROM YOUR SURGEON'S OFFICE!!!     Oral Hygiene is also important to reduce your risk of infection.                                    Remember - BRUSH YOUR TEETH THE MORNING OF SURGERY WITH YOUR REGULAR TOOTHPASTE  DENTURES WILL BE REMOVED  PRIOR TO SURGERY PLEASE DO NOT APPLY "Poly grip" OR ADHESIVES!!!   Take these medicines the morning of surgery with A SIP OF WATER: Flonase, Omeprazole, Rosuvastatin   Do not take Farxiga for 72 hours before surgery. Last dose 07/20/22.    Bring CPAP mask and tubing day of surgery.                              You may not have any metal on your body including jewelry, and body piercing             Do not wear lotions, powders, cologne, or deodorant              Men may shave face and neck.   Do not bring valuables to the hospital. [place].   Contacts, glasses, dentures or bridgework may not be worn into surgery.  DO NOT [place]. PHARMACY WILL DISPENSE MEDICATIONS LISTED ON YOUR MEDICATION LIST TO YOU DURING YOUR ADMISSION [place]!    Patients discharged on the day of surgery will not be allowed to drive home.  Someone NEEDS to stay with you for the first 24 hours after anesthesia.              Please read over the following fact sheets you were given: IF Schneider [phone] Schneiders    If you received a COVID test during your pre-op visit  it is requested that you wear a mask when out in public, stay away from anyone that may not be feeling well and notify your surgeon if you develop symptoms. If you test positive for Covid or have been in contact with anyone that has tested positive in the last 10 days please notify you surgeon.    [facility] - Preparing  for Surgery Before surgery, you can play an important role.  Because skin is not sterile, your skin needs to be as free of germs as possible.  You can reduce the number of germs on your skin by washing with CHG (chlorahexidine gluconate) soap before surgery.  CHG is an antiseptic cleaner which kills germs and bonds with the skin to continue killing germs even after washing. Please DO NOT use if you have an allergy to CHG or antibacterial soaps.  If your skin becomes reddened/irritated stop using the CHG and inform your nurse when you arrive at Short Stay. Do not shave (including legs and underarms) for at least 48 hours prior to the first CHG shower.  You may shave your face/neck.  Please follow these instructions carefully:  1.  Shower with CHG Soap the night before surgery and the  morning of surgery.  2.  If you choose to wash your hair, wash your hair first as usual with your normal  shampoo.  3.  After you shampoo, rinse your hair and body thoroughly to remove the shampoo.                             4.  Use CHG as you would any other liquid soap.  You can apply chg directly to the skin and wash.  Gently with a scrungie or clean washcloth.  5.  Apply the CHG Soap  to your body ONLY FROM THE NECK DOWN.   Do   not use on face/ open                           Wound or open sores. Avoid contact with eyes, ears mouth and   genitals (private parts).                       Wash face,  Genitals (private parts) with your normal soap.             6.  Wash thoroughly, paying special attention to the area where your    surgery  will be performed.  7.  Thoroughly rinse your body with warm water from the neck down.  8.  DO NOT shower/wash with your normal soap after using and rinsing off the CHG Soap.                9.  Pat yourself dry with a clean towel.            10.  Wear clean pajamas.            11.  Place clean sheets on your bed the night of your first shower and do not  sleep with pets. Day of  Surgery : Do not apply any lotions/deodorants the morning of surgery.  Please wear clean clothes to the hospital/surgery center.  FAILURE TO FOLLOW THESE INSTRUCTIONS MAY RESULT IN THE CANCELLATION OF YOUR SURGERY  PATIENT SIGNATURE_________________________________  NURSE SIGNATURE__________________________________  ________________________________________________________________________  Adam Phenix  An incentive spirometer is a tool that can help keep your lungs clear and active. This tool measures how well you are filling your lungs with each breath. Taking long deep breaths may help reverse or decrease the chance of developing breathing (pulmonary) problems (especially infection) following: A long period of time when you are unable to move or be active. BEFORE THE PROCEDURE  If the spirometer includes an indicator to show your best effort, your nurse or respiratory therapist will set it to a desired goal. If possible, sit up straight or lean slightly forward. Try not to slouch. Hold the incentive spirometer in an upright position. INSTRUCTIONS FOR USE  Sit on the edge of your bed if possible, or sit up as far as you can in bed or on a chair. Hold the incentive spirometer in an upright position. Breathe out normally. Place the mouthpiece in your mouth and seal your lips tightly around it. Breathe in slowly and as deeply as possible, raising the piston or the ball toward the top of the column. Hold your breath for 3-5 seconds or for as long as possible. Allow the piston or ball to fall to the bottom of the column. Remove the mouthpiece from your mouth and breathe out normally. Rest for a few seconds and repeat Steps 1 through 7 at least 10 times every 1-2 hours when you are awake. Take your time and take a few normal breaths between deep breaths. The spirometer may include an indicator to show your best effort. Use the indicator as a goal to work toward during each  repetition. After each set of 10 deep breaths, practice coughing to be sure your lungs are clear. If you have an incision (the cut made at the time of surgery), support your incision when coughing by placing a pillow or rolled up towels firmly against it. Once you are able to  get out of bed, walk around indoors and cough well. You may stop using the incentive spirometer when instructed by your caregiver.  RISKS AND COMPLICATIONS Take your time so you do not get dizzy or light-headed. If you are in pain, you may need to take or ask for pain medication before doing incentive spirometry. It is harder to take a deep breath if you are having pain. AFTER USE Rest and breathe slowly and easily. It can be helpful to keep track of a log of your progress. Your caregiver can provide you with a simple table to help with this. If you are using the spirometer at home, follow these instructions: [place] IF:  You are having difficultly using the spirometer. You have trouble using the spirometer as often as instructed. Your pain medication is not giving enough relief while using the spirometer. You develop fever of 100.5 F (38.1 C) or higher. SEEK IMMEDIATE MEDICAL CARE IF:  You cough up bloody sputum that had not been present before. You develop fever of 102 F (38.9 C) or greater. You develop worsening pain at or near the incision site. MAKE SURE YOU:  Understand these instructions. Will watch your condition. Will get help right away if you are not doing well or get worse. Document Released: 11/16/2006 Document Revised: 09/28/2011 Document Reviewed: 01/17/2007 [facility] Patient Information 2014 [place], [place].   ________________________________________________________________________

## 2022-07-23 NOTE — Progress Notes (Signed)
Anesthesia Chart Review   Case: [id number] Date/Time: 07/24/22 1300   Procedure: Right shoulder arthroscopy, debridement, subacromial decompression, distal clavicle resection (Right: Shoulder)   Anesthesia type: General   Pre-op diagnosis: Right shoulder impingement, acromioclavicular osteoarthritis, chondromalacia   Location: [facility] ROOM 01 / [facility]   Surgeons: Justice Britain, MD       DISCUSSION:57 y.o. former smoker with h/o HTN, nonobstructive CAD by cath, chronic HFpEF, right shoulder OA scheduled for above procedure 07/24/2022 with Justice Britain.   Pt last seen by cardiology 01/23/2022, stable at this visit with 1 year follow up recommended.  Nonobstructive CAD.   Anticipate pt can proceed with planned procedure barring acute status change.   VS: BP (!) 144/79   Pulse 74   Temp 37.2 C (Oral)   Resp 16   Ht '5\' 9"'$  (1.753 m)   Wt (!) 151 kg   SpO2 96%   BMI 49.18 kg/m   PROVIDERS: Jinny Sanders, MD is PCP   Cardiologist - Nelva Bush, MD  LABS: Labs reviewed: Acceptable for surgery. (all labs ordered are listed, but only abnormal results are displayed)  Labs Reviewed  CBC     IMAGES:   EKG:   CV: Cardiac Cath 02/21/2021 Conclusions: Moderate, non-obstructive coronary artery disease with 60% proximal LAD and 50% proximal ramus intermedius lesions (neither of which are hemodynamically significant by RFR). Normal left ventricular contraction with mildly elevated filling pressure consistent with diastolic dysfunction.   Recommendations: Continue medical therapy and risk factor modification to prevent progression of moderate coronary artery disease.   Echo 09/20/2019 1. Very Poor echo images causing difficulty to evaluate wall motion  abnormalities. Overall EF appears normal.. Left ventricular ejection  fraction, by estimation, is 55 to 60%. The left ventricle has normal  function. Left ventricular endocardial border  not optimally defined to evaluate regional wall  motion. There is unable to  assess left ventricular hypertrophy. Left ventricular diastolic function  could not be evaluated.   2. Right ventricular systolic function is normal. The right ventricular  size is normal. There is normal pulmonary artery systolic pressure.   3. The mitral valve is grossly normal. No evidence of mitral valve  regurgitation.   4. The aortic valve is grossly normal. Aortic valve regurgitation is not  visualized.   5. The inferior vena cava is normal in size with greater than 50%  respiratory variability, suggesting right atrial pressure of 3 mmHg.   Stress Test 08/10/2016 Exercise treadmill stress study Resting EKG shows normal sinus rhythm with rate 67 bpm, no significant ST or T-wave changes  Resting blood pressure 122/76 Patient completed standard Bruce protocol Total exercise time 6 minutes 48 seconds, peak heart rate 155 bpm, 91% of maximal predicted heart rate 170 bpm, Hypertensive response with exercise, peak rash or 217/58 No significant ST or T-wave changes at peak stress or in recovery concerning for ischemia Heart rate down to 100 bpm at 3 minutes in recovery   Overall a normal exercise treadmill stress study, adequate exercise tolerance, target heart rate achieved, hypertensive with exercise Past Medical History:  Diagnosis Date   Acute sinusitis, unspecified    Arthritis    Coronary artery disease    Moderate, non-obstructive coronary artery disease with 60% proximal LAD and 50% proximal ramus intermedius lesions   Dermatophytosis of nail    Esophageal reflux    Essential hypertension, benign    Impotence of organic origin    Morbid obesity (HCC)    Obstructive sleep  apnea (adult) (pediatric)    Other and unspecified hyperlipidemia    Other malaise and fatigue    Other testicular hypofunction    Pure hypercholesterolemia    Rosacea    Routine general medical examination at a health care facility    Sleep apnea    Syncope    last one in  2022    Past Surgical History:  Procedure Laterality Date   BACK SURGERY  07/20/2001   CARDIAC CATHETERIZATION     cardiolyte  07/21/1999   neg   INTRAVASCULAR PRESSURE WIRE/FFR STUDY N/A 02/21/2021   Procedure: INTRAVASCULAR PRESSURE WIRE/FFR STUDY;  Surgeon: Nelva Bush, MD;  Location: [facility];  Service: Cardiovascular;  Laterality: N/A;   KNEE SURGERY Left 07/21/2003   LEFT HEART CATH AND CORONARY ANGIOGRAPHY N/A 02/21/2021   Procedure: LEFT HEART CATH AND CORONARY ANGIOGRAPHY;  Surgeon: Nelva Bush, MD;  Location: [facility];  Service: Cardiovascular;  Laterality: N/A;   PFT  07/20/2005   WNL   WRIST SURGERY Right     MEDICATIONS:  ASPIRIN LOW DOSE 81 MG tablet   diclofenac Sodium (VOLTAREN) 1 % GEL   FARXIGA 10 MG TABS tablet   fluticasone (FLONASE) 50 MCG/ACT nasal spray   furosemide (LASIX) 40 MG tablet   losartan (COZAAR) 100 MG tablet   nitroGLYCERIN (NITROSTAT) 0.4 MG SL tablet   NON FORMULARY   omeprazole (PRILOSEC) 40 MG capsule   rosuvastatin (CRESTOR) 40 MG tablet   tadalafil (CIALIS) 5 MG tablet   No current facility-administered medications for this encounter.    Konrad Felix Ward, PA-C [facility] [phone]

## 2022-07-24 ENCOUNTER — Other Ambulatory Visit: Payer: Self-pay

## 2022-07-24 ENCOUNTER — Ambulatory Visit (HOSPITAL_COMMUNITY): Payer: No Typology Code available for payment source | Admitting: Certified Registered Nurse Anesthetist

## 2022-07-24 ENCOUNTER — Ambulatory Visit (HOSPITAL_BASED_OUTPATIENT_CLINIC_OR_DEPARTMENT_OTHER): Payer: No Typology Code available for payment source | Admitting: Certified Registered Nurse Anesthetist

## 2022-07-24 ENCOUNTER — Encounter (HOSPITAL_COMMUNITY)
Admission: RE | Disposition: A | Discharge status: Home / Self Care | Payer: Self-pay | Source: Home / Self Care | Attending: Orthopedic Surgery

## 2022-07-24 ENCOUNTER — Ambulatory Visit (HOSPITAL_COMMUNITY)
Admission: RE | Admit: 2022-07-24 | Discharge: 2022-07-24 | Disposition: A | Discharge status: Home / Self Care | Payer: No Typology Code available for payment source | Attending: Orthopedic Surgery | Admitting: Orthopedic Surgery

## 2022-07-24 DIAGNOSIS — G4733 Obstructive sleep apnea (adult) (pediatric): Secondary | ICD-10-CM | POA: Diagnosis not present

## 2022-07-24 DIAGNOSIS — M25812 Other specified joint disorders, left shoulder: Secondary | ICD-10-CM

## 2022-07-24 DIAGNOSIS — I2510 Atherosclerotic heart disease of native coronary artery without angina pectoris: Secondary | ICD-10-CM | POA: Insufficient documentation

## 2022-07-24 DIAGNOSIS — M19012 Primary osteoarthritis, left shoulder: Secondary | ICD-10-CM

## 2022-07-24 DIAGNOSIS — Z6841 Body Mass Index (BMI) 40.0 and over, adult: Secondary | ICD-10-CM | POA: Diagnosis not present

## 2022-07-24 DIAGNOSIS — M94212 Chondromalacia, left shoulder: Secondary | ICD-10-CM

## 2022-07-24 DIAGNOSIS — Z87891 Personal history of nicotine dependence: Secondary | ICD-10-CM

## 2022-07-24 DIAGNOSIS — I10 Essential (primary) hypertension: Secondary | ICD-10-CM

## 2022-07-24 DIAGNOSIS — S43432A Superior glenoid labrum lesion of left shoulder, initial encounter: Secondary | ICD-10-CM | POA: Insufficient documentation

## 2022-07-24 DIAGNOSIS — M7542 Impingement syndrome of left shoulder: Secondary | ICD-10-CM

## 2022-07-24 DIAGNOSIS — K219 Gastro-esophageal reflux disease without esophagitis: Secondary | ICD-10-CM | POA: Diagnosis not present

## 2022-07-24 DIAGNOSIS — R7303 Prediabetes: Secondary | ICD-10-CM

## 2022-07-24 HISTORY — DX: Atherosclerotic heart disease of native coronary artery without angina pectoris: I25.10

## 2022-07-24 HISTORY — PX: SHOULDER ARTHROSCOPY: SHX128

## 2022-07-24 SURGERY — ARTHROSCOPY, SHOULDER
Anesthesia: General | Site: Shoulder | Laterality: Left

## 2022-07-24 MED ORDER — PROPOFOL 10 MG/ML IV BOLUS
INTRAVENOUS | Status: DC | PRN
  Administered 2022-07-24: 200 mg via INTRAVENOUS

## 2022-07-24 MED ORDER — SUCCINYLCHOLINE CHLORIDE 200 MG/10ML IV SOSY
PREFILLED_SYRINGE | INTRAVENOUS | Status: DC | PRN
  Administered 2022-07-24: 140 mg via INTRAVENOUS

## 2022-07-24 MED ORDER — FENTANYL CITRATE (PF) 100 MCG/2ML IJ SOLN
INTRAMUSCULAR | Status: DC | PRN
  Administered 2022-07-24: 50 ug via INTRAVENOUS

## 2022-07-24 MED ORDER — ONDANSETRON HCL 4 MG PO TABS: 4.0000 mg | ORAL_TABLET | Freq: Three times a day (TID) | ORAL | 0 refills | Status: DC | PRN

## 2022-07-24 MED ORDER — FENTANYL CITRATE (PF) 100 MCG/2ML IJ SOLN
INTRAMUSCULAR | Status: AC
  Filled 2022-07-24: qty 2

## 2022-07-24 MED ORDER — SUGAMMADEX SODIUM 500 MG/5ML IV SOLN
INTRAVENOUS | Status: DC | PRN
  Administered 2022-07-24: 300 mg via INTRAVENOUS

## 2022-07-24 MED ORDER — PROPOFOL 10 MG/ML IV BOLUS
INTRAVENOUS | Status: AC
  Filled 2022-07-24: qty 20

## 2022-07-24 MED ORDER — DEXAMETHASONE SODIUM PHOSPHATE 10 MG/ML IJ SOLN
INTRAMUSCULAR | Status: DC | PRN
  Administered 2022-07-24: 8 mg via INTRAVENOUS

## 2022-07-24 MED ORDER — OXYCODONE HCL 5 MG PO TABS: 10.0000 mg | ORAL_TABLET | ORAL | Status: DC | PRN

## 2022-07-24 MED ORDER — CHLORHEXIDINE GLUCONATE 0.12 % MT SOLN
15.0000 mL | Freq: Once | OROMUCOSAL | Status: AC
  Administered 2022-07-24: 15 mL via OROMUCOSAL

## 2022-07-24 MED ORDER — OXYCODONE-ACETAMINOPHEN 5-325 MG PO TABS: 1.0000 | ORAL_TABLET | ORAL | 0 refills | Status: DC | PRN

## 2022-07-24 MED ORDER — AMISULPRIDE (ANTIEMETIC) 5 MG/2ML IV SOLN: 10.0000 mg | Freq: Once | INTRAVENOUS | Status: DC | PRN

## 2022-07-24 MED ORDER — CEFAZOLIN IN SODIUM CHLORIDE 3-0.9 GM/100ML-% IV SOLN
3.0000 g | INTRAVENOUS | Status: AC
Start: 2022-07-24 — End: 2022-07-24
  Administered 2022-07-24: 3 g via INTRAVENOUS
  Filled 2022-07-24: qty 100

## 2022-07-24 MED ORDER — ROCURONIUM BROMIDE 10 MG/ML (PF) SYRINGE
PREFILLED_SYRINGE | INTRAVENOUS | Status: DC | PRN
  Administered 2022-07-24: 60 mg via INTRAVENOUS

## 2022-07-24 MED ORDER — LIDOCAINE 2% (20 MG/ML) 5 ML SYRINGE
INTRAMUSCULAR | Status: DC | PRN
  Administered 2022-07-24: 80 mg via INTRAVENOUS

## 2022-07-24 MED ORDER — FENTANYL CITRATE PF 50 MCG/ML IJ SOSY
PREFILLED_SYRINGE | INTRAMUSCULAR | Status: AC
  Administered 2022-07-24: 50 ug via INTRAVENOUS
  Filled 2022-07-24: qty 2

## 2022-07-24 MED ORDER — ONDANSETRON HCL 4 MG/2ML IJ SOLN
INTRAMUSCULAR | Status: DC | PRN
  Administered 2022-07-24: 4 mg via INTRAVENOUS

## 2022-07-24 MED ORDER — FENTANYL CITRATE PF 50 MCG/ML IJ SOSY
PREFILLED_SYRINGE | INTRAMUSCULAR | Status: AC
  Administered 2022-07-24: 50 ug via INTRAVENOUS
  Filled 2022-07-24: qty 1

## 2022-07-24 MED ORDER — SODIUM CHLORIDE 0.9 % IR SOLN
Status: DC | PRN
  Administered 2022-07-24: 9000 mL

## 2022-07-24 MED ORDER — LACTATED RINGERS IV SOLN: INTRAVENOUS | Status: DC

## 2022-07-24 MED ORDER — SUGAMMADEX SODIUM 500 MG/5ML IV SOLN
INTRAVENOUS | Status: AC
  Filled 2022-07-24: qty 5

## 2022-07-24 MED ORDER — FENTANYL CITRATE PF 50 MCG/ML IJ SOSY
50.0000 ug | PREFILLED_SYRINGE | INTRAMUSCULAR | Status: DC
  Administered 2022-07-24: 50 ug via INTRAVENOUS
  Filled 2022-07-24: qty 2

## 2022-07-24 MED ORDER — ONDANSETRON HCL 4 MG/2ML IJ SOLN
INTRAMUSCULAR | Status: AC
  Filled 2022-07-24: qty 2

## 2022-07-24 MED ORDER — DEXAMETHASONE SODIUM PHOSPHATE 10 MG/ML IJ SOLN
INTRAMUSCULAR | Status: AC
  Filled 2022-07-24: qty 1

## 2022-07-24 MED ORDER — FENTANYL CITRATE PF 50 MCG/ML IJ SOSY
25.0000 ug | PREFILLED_SYRINGE | INTRAMUSCULAR | Status: DC | PRN
  Administered 2022-07-24: 50 ug via INTRAVENOUS

## 2022-07-24 MED ORDER — BUPIVACAINE LIPOSOME 1.3 % IJ SUSP
INTRAMUSCULAR | Status: DC | PRN
  Administered 2022-07-24: 10 mL via PERINEURAL

## 2022-07-24 MED ORDER — MIDAZOLAM HCL 2 MG/2ML IJ SOLN
1.0000 mg | INTRAMUSCULAR | Status: DC
  Administered 2022-07-24: 1 mg via INTRAVENOUS
  Filled 2022-07-24: qty 2

## 2022-07-24 MED ORDER — LIDOCAINE HCL (PF) 2 % IJ SOLN
INTRAMUSCULAR | Status: AC
  Filled 2022-07-24: qty 5

## 2022-07-24 MED ORDER — ORAL CARE MOUTH RINSE: 15.0000 mL | Freq: Once | OROMUCOSAL | Status: AC

## 2022-07-24 MED ORDER — ACETAMINOPHEN 500 MG PO TABS
1000.0000 mg | ORAL_TABLET | Freq: Once | ORAL | Status: AC
  Administered 2022-07-24: 1000 mg via ORAL
  Filled 2022-07-24: qty 2

## 2022-07-24 MED ORDER — BUPIVACAINE-EPINEPHRINE (PF) 0.5% -1:200000 IJ SOLN
INTRAMUSCULAR | Status: DC | PRN
  Administered 2022-07-24: 15 mL via PERINEURAL

## 2022-07-24 MED ORDER — CYCLOBENZAPRINE HCL 10 MG PO TABS: 10.0000 mg | ORAL_TABLET | Freq: Three times a day (TID) | ORAL | 1 refills | Status: DC | PRN

## 2022-07-24 SURGICAL SUPPLY — 54 items
BLADE EXCALIBUR 4.0X13 (MISCELLANEOUS) ×1 IMPLANT
BOOTIES KNEE HIGH SLOAN (MISCELLANEOUS) ×2 IMPLANT
BURR OVAL 8 FLU 4.0X13 (MISCELLANEOUS) ×1 IMPLANT
CANNULA ACUFLEX KIT 5X76 (CANNULA) ×1 IMPLANT
CANNULA DRILOCK 5.0X75 (CANNULA) IMPLANT
CANNULA TWIST IN 8.25X7CM (CANNULA) IMPLANT
CONNECTOR 5 IN 1 STRAIGHT STRL (MISCELLANEOUS) ×1 IMPLANT
COOLER ICEMAN CLASSIC (MISCELLANEOUS) IMPLANT
DISSECTOR  3.8MM X 13CM (MISCELLANEOUS) ×1
DISSECTOR 3.8MM X 13CM (MISCELLANEOUS) ×1 IMPLANT
DRAPE INCISE 23X17 IOBAN STRL (DRAPES) ×1
DRAPE INCISE 23X17 STRL (DRAPES) ×1 IMPLANT
DRAPE INCISE IOBAN 23X17 STRL (DRAPES) ×1 IMPLANT
DRAPE INCISE IOBAN 66X45 STRL (DRAPES) ×1 IMPLANT
DRAPE ORTHO SPLIT 77X108 STRL (DRAPES)
DRAPE STERI 35X30 U-POUCH (DRAPES) ×1 IMPLANT
DRAPE SURG 17X11 SM STRL (DRAPES) ×1 IMPLANT
DRAPE SURG ORHT 6 SPLT 77X108 (DRAPES) IMPLANT
DRAPE U-SHAPE 47X51 STRL (DRAPES) ×1 IMPLANT
DURAPREP 26ML APPLICATOR (WOUND CARE) ×1 IMPLANT
GAUZE PAD ABD 8X10 STRL (GAUZE/BANDAGES/DRESSINGS) ×2 IMPLANT
GAUZE SPONGE 4X4 12PLY STRL (GAUZE/BANDAGES/DRESSINGS) ×1 IMPLANT
GLOVE BIO SURGEON STRL SZ7.5 (GLOVE) ×1 IMPLANT
GLOVE BIO SURGEON STRL SZ8 (GLOVE) ×1 IMPLANT
GLOVE SS BIOGEL STRL SZ 7 (GLOVE) ×3 IMPLANT
GLOVE SS BIOGEL STRL SZ 7.5 (GLOVE) ×1 IMPLANT
GOWN STRL SURGICAL XL XLNG (GOWN DISPOSABLE) ×2 IMPLANT
KIT BASIN OR (CUSTOM PROCEDURE TRAY) ×1 IMPLANT
KIT SHOULDER TRACTION (DRAPES) ×1 IMPLANT
KIT TURNOVER KIT A (KITS) IMPLANT
MANIFOLD NEPTUNE II (INSTRUMENTS) ×1 IMPLANT
NDL SCORPION MULTI FIRE (NEEDLE) IMPLANT
NEEDLE SCORPION MULTI FIRE (NEEDLE) IMPLANT
NS IRRIG 1000ML POUR BTL (IV SOLUTION) ×1 IMPLANT
PACK ARTHROSCOPY WL (CUSTOM PROCEDURE TRAY) ×1 IMPLANT
PAD ARMBOARD 7.5X6 YLW CONV (MISCELLANEOUS) ×1 IMPLANT
PAD COLD SHLDR WRAP-ON (PAD) IMPLANT
PROBE APOLLO 90XL (SURGICAL WAND) ×1 IMPLANT
SLING ARM FOAM STRAP XLG (SOFTGOODS) IMPLANT
SPONGE T-LAP 4X18 ~~LOC~~+RFID (SPONGE) ×1 IMPLANT
STRIP CLOSURE SKIN 1/2X4 (GAUZE/BANDAGES/DRESSINGS) ×1 IMPLANT
SUT FIBERWIRE #2 38 T-5 BLUE (SUTURE)
SUT MNCRL AB 3-0 PS2 18 (SUTURE) ×1 IMPLANT
SUT PDS AB 0 CT 36 (SUTURE) IMPLANT
SUT TIGER TAPE 7 IN WHITE (SUTURE) ×1 IMPLANT
SUTURE FIBERWR #2 38 T-5 BLUE (SUTURE) IMPLANT
TAPE CLOTH 3X10 WHT NS LF (GAUZE/BANDAGES/DRESSINGS) IMPLANT
TAPE FIBER 2MM 7IN #2 BLUE (SUTURE) IMPLANT
TAPE PAPER 3X10 WHT MICROPORE (GAUZE/BANDAGES/DRESSINGS) ×1 IMPLANT
TOWEL OR 17X26 10 PK STRL BLUE (TOWEL DISPOSABLE) ×1 IMPLANT
TUBING ARTHROSCOPY IRRIG 16FT (MISCELLANEOUS) ×1 IMPLANT
WAND ABLATOR APOLLO I90 (BUR) IMPLANT
WATER STERILE IRR 1000ML POUR (IV SOLUTION) ×1 IMPLANT
YANKAUER SUCT BULB TIP 10FT TU (MISCELLANEOUS) ×1 IMPLANT

## 2022-07-24 NOTE — Transfer of Care (Signed)
Immediate Anesthesia Transfer of Care Note  Patient: Todd Owens  Procedure(s) Performed: Left shoulder arthroscopy, debridement, subacromial decompression, distal clavicle resection (Left: Shoulder)  Patient Location: PACU  Anesthesia Type:General  Level of Consciousness: awake  Airway & Oxygen Therapy: Patient Spontanous Breathing and Patient connected to face mask oxygen  Post-op Assessment: Report given to RN and Post -op Vital signs reviewed and stable  Post vital signs: Reviewed and stable  Last Vitals:  Vitals Value Taken Time  BP 123/62   Temp    Pulse 80 07/24/22 1543  Resp 25 07/24/22 1543  SpO2 94 % 07/24/22 1543  Vitals shown include unvalidated device data.  Last Pain:  Vitals:   07/24/22 1245  TempSrc: Oral  PainSc: 0-No pain         Complications:  Encounter Notable Events  Notable Event Outcome Phase Comment  Difficult to intubate - expected  Intraprocedure Filed from anesthesia note documentation.

## 2022-07-24 NOTE — Progress Notes (Signed)
Patient here for surgery. Per patient he is having arthroscopy on LEFT shoulder.  Order for RIGHT shoulder, patient signed consent for RIGHT shoulder on 07/23/22. Call to Dr. Onnie Graham, per Dr. Onnie Graham patient is correct and it is his LEFT shoulder that is to have arthroscopy.  Consent pulled from chart and provided to charge RN.  LEFT shoulder consent placed.LEFT consent order placed per verbal order.

## 2022-07-24 NOTE — Anesthesia Preprocedure Evaluation (Signed)
Anesthesia Evaluation  Patient identified by MRN, date of birth, ID band Patient awake    Reviewed: Allergy & Precautions, NPO status , Patient's Chart, lab work & pertinent test results  Airway Mallampati: III  TM Distance: >3 FB Neck ROM: Full    Dental  (+) Dental Advisory Given   Pulmonary sleep apnea , former smoker   breath sounds clear to auscultation       Cardiovascular hypertension, + CAD   Rhythm:Regular Rate:Normal     Neuro/Psych    GI/Hepatic Neg liver ROS,GERD  ,,  Endo/Other    Morbid obesity  Renal/GU negative Renal ROS     Musculoskeletal  (+) Arthritis ,    Abdominal   Peds  Hematology negative hematology ROS (+)   Anesthesia Other Findings   Reproductive/Obstetrics                              Lab Results  Component Value Date   WBC 5.2 07/23/2022   HGB 12.8 (L) 07/23/2022   HCT 36.5 (L) 07/23/2022   MCV 96.6 07/23/2022   PLT 213 07/23/2022   Lab Results  Component Value Date   CREATININE 1.14 07/03/2022   BUN 16 07/03/2022   NA 141 07/03/2022   K 4.1 07/03/2022   CL 102 07/03/2022   CO2 27 07/03/2022    Anesthesia Physical Anesthesia Plan  ASA: 3  Anesthesia Plan: General   Post-op Pain Management: Regional block* and Tylenol PO (pre-op)*   Induction: Intravenous  PONV Risk Score and Plan: 2 and Dexamethasone, Ondansetron and Treatment may vary due to age or medical condition  Airway Management Planned: Oral ETT  Additional Equipment: None  Intra-op Plan:   Post-operative Plan: Extubation in OR  Informed Consent: I have reviewed the patients History and Physical, chart, labs and discussed the procedure including the risks, benefits and alternatives for the proposed anesthesia with the patient or authorized representative who has indicated his/her understanding and acceptance.     Dental advisory given  Plan Discussed with:  CRNA  Anesthesia Plan Comments:          Anesthesia Quick Evaluation

## 2022-07-24 NOTE — Discharge Instructions (Signed)
   Kevin M. Supple, M.D., F.A.A.O.S. Orthopaedic Surgery Specializing in Arthroscopic and Reconstructive Surgery of the Shoulder [phone] [address] - [place], [place] [address] - Fax [phone]   POST-OP SHOULDER ARTHROSCOPY INSTRUCTIONS  1. Call the office at [phone] to schedule your first post-op appointment 7-10 days from the date of your surgery.  2. Leave the steri-strips in place over your incisions when performing dressing changes and showering. You may remove your dressings and begin showering 72 hours from surgery. You can expect drainage that is clear to bloody in nature that occasionally will soak through your dressings. If this occurs go ahead and perform a dressing change. The drainage should lessen daily and when there is no drainage from your incisions feel free to go without a dressing.  3. Wear your sling for comfort. You may come out of your sling for ad lib activity and even decide not to use the sling at all. If you find you are more comfortable in your sling, make sure you come out of your sling at least 3-4 times a day to do the exercises that are included below.  4. Range of motion to your elbow, wrist, and hand are encouraged 3-5 times daily. Exercise to your hand and fingers helps to reduce swelling you may experience.  5. Utilize ice to the shoulder 3-4 times minimum a day and additionally if you are experiencing pain.  6. You may drive when safely off narcotics and muscle relaxants.  7.Pain control following an exparel block  To help control your post-operative pain you received a nerve block  performed with Exparel which is a long acting anesthetic (numbing agent) which can provide pain relief and sensations of numbness (and relief of pain) in the operative shoulder and arm for up to 3 days. Sometimes it provides mixed relief, meaning you may still have numbness in certain areas of the arm but can still be able to move  parts of that arm,  hand, and fingers. We recommend that your prescribed pain medications  be used as needed. We do not feel it is necessary to "pre medicate" and "stay ahead" of pain.  Taking narcotic pain medications when you are not having any pain can lead to unnecessary and potentially dangerous side effects.    8. Pain medications can produce constipation along with their use. If you experience this, the use of an over the counter stool softener or laxative daily is recommended.   9. For additional questions or concerns, please do not hesitate to call the office. If after hours there is an answering service to forward your concerns to the physician on call.   POST-OP EXERCISES  The pendulum exercises should be performed while bending at the waist as far over as possible thereby letting gravity do the work for you.  Range of Motion Exercises: Pendulum (circular)  Repeat 20 times. Do 3 sessions per day.     Range of Motion Exercises: Pendulum (side-to-side)  Repeat 20 times. Do 3 sessions per day.    Range of Motion Exercises (self-stretching activities):  Slide arm up wall with palm toward you, moving closer to the wall. Hold for 5 seconds.  Repeat 10 times. Do 3 sessions per day.       

## 2022-07-24 NOTE — Op Note (Signed)
07/24/2022  3:18 PM  PATIENT:   Todd Owens  57 y.o. male  PRE-OPERATIVE DIAGNOSIS:  Left shoulder impingement, acromioclavicular osteoarthritis, chondromalacia  POST-OPERATIVE DIAGNOSIS: Same with additional finding of extensive degenerative labral tear  PROCEDURE:  1.  Left shoulder examination under esthesia  2.  Left shoulder glenohumeral joint diagnostic arthroscopy  3.  Debridement of extensive degenerative labral tear  4.  Chondroplasty of the glenohumeral joint  5.  Arthroscopic subacromial decompression and bursectomy  6.  Arthroscopic distal clavicle resection  SURGEON:  Keawe Marcello, Metta Clines. M.D.  ASSISTANTS: Jenetta Loges, PA-C  Jenetta Loges, PA-C was utilized as an Environmental consultant throughout this case, essential for help with positioning the patient, positioning extremity, tissue manipulation, implantation of the prosthesis, suture management, wound closure, and intraoperative decision-making.  Edition the patient's extreme overweight status necessitated surgical assistance  ANESTHESIA:   General endotracheal and interscalene block with Exparel  EBL: Minimal  SPECIMEN: None  Drains: None   PATIENT DISPOSITION:  PACU - hemodynamically stable.    PLAN OF CARE: Discharge to home after PACU  Brief history:  Patient is a 57 year old male with chronic left shoulder pain after work-related injury that has been refractory to prolonged attempts of conservative management.  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  Due to his ongoing severe pain and functional imitations and failure to respond to prolonged attempts at conservative management he is brought to the operating room at this time for planned left shoulder arthroscopy as described below.  Preoperatively the patient was counseled regarding treatment options as well as the potential risks versus benefits there.  Possible surgical complications were  reviewed including the potential for bleeding, infection, neurovascular injury, persistence of pain, loss of motion, anesthetic complication, possible need for additional surgery.  Additionally, the patient was counseled that underlying glenohumeral arthritis would not be changed by the arthroscopic surgery.  He understands, and accepts, and agrees with the planned procedure.  Procedure detail:  After undergoing routine preop evaluation the patient received prophylactic antibiotics and interscalene block with Exparel was established in the holding area by the anesthesia department.  Patient was subsequently placed supine on the operating table and underwent the smooth induction of a general endotracheal anesthesia.  Turned to the right lateral decubitus position on a beanbag and appropriately padded and protected.  Left shoulder examination revealed full motion with no obvious instability patterns.  Left arm was then suspended at 50 degrees of abduction and was sterilely prepped and draped in standard fashion timeout was called.  A posterior portal was established into the glenohumeral joint and an anterior portal established under direct visualization.  The articular surfaces showed broad area of significant chondral thinning with grade II-III chondromalacia diffusely in several areas.  There Pilger nearly full-thickness cartilage loss.  There was extensive degenerative labral tear circumferentially which was debrided with a shaver back to stable margin.  The rotator cuff was carefully inspected and found to be intact.  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  Limited synovectomy was also performed.  Hemostasis was obtained using the Arthrex wand.  This point fluid and instruments were removed from the glenohumeral joint.  The arthroscope was then introduced into the subacromial space to the posterior portal and a direct lateral portal was established  in the subacromial space.  Abundant dense bursal tissue multiple adhesions were encountered and these were all divided and excised with  a combination of the shaver and the Stryker 1.  The wand was then used to remove the periosteum from the undersurface of the anterior half of the acromion and a subacromial decompression was performed with a bur crating a type I morphology.  A portal was then established directly anterior to the distal clavicle and a distal clavicle resection was performed with a bur.  Care was taken to confirm visualization of the entire circumference of the distal clavicle to ensure adequate removal of bone.  We then completed the subacromial/subdeltoid bursectomy.  Bursal surface the rotator cuff was found to be intact.  Final hemostasis was obtained.  Fluid and incisura removed.  The portals were closed with a Monocryl and Steri-Strips.  A dry dressing taped about the left shoulder and left arm placed into a sling immobilizer and the patient was awakened, extubated, and taken to the recovery in stable condition.  Marin Shutter MD   Contact # [phone]

## 2022-07-24 NOTE — Anesthesia Procedure Notes (Signed)
Anesthesia Regional Block: Interscalene brachial plexus block   Pre-Anesthetic Checklist: , timeout performed,  Correct Patient, Correct Site, Correct Laterality,  Correct Procedure, Correct Position, site marked,  Risks and benefits discussed,  Surgical consent,  Pre-op evaluation,  At surgeon's request and post-op pain management  Laterality: Left  Prep: chloraprep       Needles:  Injection technique: Single-shot  Needle Type: Echogenic Stimulator Needle     Needle Length: 9cm  Needle Gauge: 21     Additional Needles:   Procedures:, nerve stimulator,,, ultrasound used (permanent image in chart),,     Nerve Stimulator or Paresthesia:  Response: deltoid, 0.5 mA  Additional Responses:   Narrative:  Start time: 07/24/2022 12:48 PM End time: 07/24/2022 12:54 PM Injection made incrementally with aspirations every 5 mL.  Performed by: Personally  Anesthesiologist: Suzette Battiest, MD

## 2022-07-24 NOTE — H&P (Signed)
Todd Owens    Chief Complaint: Left shoulder impingement, acromioclavicular osteoarthritis, chondromalacia HPI: The patient is a 57 y.o. male with chronic left shoulder pain after a mechanical fall.  His symptoms have been persistent despite prolonged attempts of conservative management.  Preop imaging does show evidence for some glenohumeral arthritis as well as severe impingement and AC arthritis.  He is brought to the operating room this time for planned left shoulder arthroscopy with debridement.  Past Medical History:  Diagnosis Date   Acute sinusitis, unspecified    Arthritis    Coronary artery disease    Moderate, non-obstructive coronary artery disease with 60% proximal LAD and 50% proximal ramus intermedius lesions   Dermatophytosis of nail    Esophageal reflux    Essential hypertension, benign    Impotence of organic origin    Morbid obesity ([place])    Obstructive sleep apnea (adult) (pediatric)    Other and unspecified hyperlipidemia    Other malaise and fatigue    Other testicular hypofunction    Pure hypercholesterolemia    Rosacea    Routine general medical examination at a health care facility    Sleep apnea    Syncope    last one in 2022      Past Surgical History:  Procedure Laterality Date   BACK SURGERY  07/20/2001   CARDIAC CATHETERIZATION     cardiolyte  07/21/1999   neg   INTRAVASCULAR PRESSURE WIRE/FFR STUDY N/A 02/21/2021   Procedure: INTRAVASCULAR PRESSURE WIRE/FFR STUDY;  Surgeon: Nelva Bush, MD;  Location: [facility];  Service: Cardiovascular;  Laterality: N/A;   KNEE SURGERY Left 07/21/2003   LEFT HEART CATH AND CORONARY ANGIOGRAPHY N/A 02/21/2021   Procedure: LEFT HEART CATH AND CORONARY ANGIOGRAPHY;  Surgeon: Nelva Bush, MD;  Location: [facility];  Service: Cardiovascular;  Laterality: N/A;   PFT  07/20/2005   WNL   WRIST SURGERY Right     Family History  Problem Relation Age of Onset   Hypertension Mother     Heart disease Mother        pacemaker   Hypertension Father    Stroke Father    Heart disease Father 68   Heart attack Father    Heart disease Sister    Heart attack Sister 63   Heart failure Sister    Colon cancer Neg Hx    Esophageal cancer Neg Hx    Liver cancer Neg Hx    Pancreatic cancer Neg Hx    Stomach cancer Neg Hx    Rectal cancer Neg Hx     Social History:  reports that he quit smoking about 29 years ago. His smoking use included cigarettes. He has a 15.00 pack-year smoking history. He has quit using smokeless tobacco.  His smokeless tobacco use included chew. He reports current alcohol use. He reports that he does not use drugs.  BMI: Estimated body mass index is 49.16 kg/m as calculated from the following:   Height as of this encounter: '5\' 9"'$  (1.753 m).   Weight as of this encounter: 151 kg.  Lab Results  Component Value Date   ALBUMIN 3.9 04/25/2021   Diabetes: Patient does not have a diagnosis of diabetes. Lab Results  Component Value Date   HGBA1C 6.3 (H) 07/03/2022     Smoking Status:       Medications Prior to Admission  Medication Sig Dispense Refill   ASPIRIN LOW DOSE 81 MG tablet TAKE 1 TABLET BY MOUTH  EVERY DAY 90 tablet 0   FARXIGA 10 MG TABS tablet TAKE 1 TABLET BY MOUTH DAILY BEFORE BREAKFAST. 30 tablet 10   fluticasone (FLONASE) 50 MCG/ACT nasal spray PLACE 1 SPRAY INTO BOTH NOSTRILS 2 (TWO) TIMES DAILY (Patient taking differently: Place 1 spray into both nostrils daily as needed for allergies.) 16 mL 5   furosemide (LASIX) 40 MG tablet TAKE 1 TABLET BY MOUTH EVERY DAY 90 tablet 0   losartan (COZAAR) 100 MG tablet TAKE 1 TABLET BY MOUTH EVERY DAY 90 tablet 0   nitroGLYCERIN (NITROSTAT) 0.4 MG SL tablet Place 0.4 mg under the tongue every 5 (five) minutes as needed for chest pain.     NON FORMULARY Pt uses a cpap nightly     omeprazole (PRILOSEC) 40 MG capsule TAKE 1 CAPSULE BY MOUTH EVERY DAY 90 capsule 0   rosuvastatin (CRESTOR) 40 MG  tablet TAKE 1 TABLET BY MOUTH EVERY DAY 90 tablet 0   diclofenac Sodium (VOLTAREN) 1 % GEL APPLY 4 GRAMS TOPICALLY 4 TIMES DAILY**ASPIRIN & NSAIDS ORALLY NOT RECOMMENDED GIVEN CARDIAC ISSUES (Patient taking differently: Apply 4 g topically daily as needed (knee pain).) 100 g 2   tadalafil (CIALIS) 5 MG tablet Take 1-4 tablets (5-20 mg total) by mouth daily as needed for erectile dysfunction. 30 tablet 11     Physical Exam: Left shoulder demonstrates painful and guarded motion as noted at his recent office visits.  He is neurovascular intact.  Imaging studies confirm severe AC joint arthritis as well as bony impingement and some early glenohumeral arthrosis as well.  Vitals  Temp:  [99.2 F (37.3 C)-99.4 F (37.4 C)] 99.2 F (37.3 C) (01/05 1245) Pulse Rate:  [58-72] 64 (01/05 1313) Resp:  [13-22] 13 (01/05 1313) BP: (120-140)/(69-88) 125/72 (01/05 1310) SpO2:  [90 %-94 %] 93 % (01/05 1313) Weight:  [151 kg] 151 kg (01/05 1215)  Assessment/Plan  Impression: Left shoulder impingement, acromioclavicular osteoarthritis, chondromalacia  Plan of Action: Procedure(s): Left shoulder arthroscopy, debridement, subacromial decompression, distal clavicle resection  Lessie Manigo M Goran Olden 07/24/2022, 1:51 PM Contact # [phone]

## 2022-07-24 NOTE — Anesthesia Procedure Notes (Signed)
Procedure Name: Intubation Date/Time: 07/24/2022 2:10 PM  Performed by: Niel Hummer, CRNAPre-anesthesia Checklist: Patient identified, Emergency Drugs available, Suction available and Patient being monitored Patient Re-evaluated:Patient Re-evaluated prior to induction Oxygen Delivery Method: Circle system utilized Preoxygenation: Pre-oxygenation with 100% oxygen Induction Type: IV induction Ventilation: Two handed mask ventilation required and Oral airway inserted - appropriate to patient size Laryngoscope Size: Glidescope Grade View: Grade II Tube size: 7.5 mm Number of attempts: 2 Airway Equipment and Method: Stylet and Oral airway Placement Confirmation: ETT inserted through vocal cords under direct vision, positive ETCO2 and breath sounds checked- equal and bilateral Secured at: 24 cm Tube secured with: Tape Dental Injury: Teeth and Oropharynx as per pre-operative assessment  Difficulty Due To: Difficulty was anticipated Comments: Grade 3 view with MAC 4. Grade 2a with glide scope.

## 2022-07-24 NOTE — Progress Notes (Signed)
Dr. Valma Cava anesthesiologist called and came to bedside immediately regarding oxygen saturation level. Per Dr. Valma Cava okay to move patient to phase two of recovery in anticipation for discharge, pt encouraged to continue with IS and be diligent with cpap usages tonight, pt verbalized understanding.

## 2022-07-27 NOTE — Anesthesia Postprocedure Evaluation (Signed)
Anesthesia Post Note  Patient: Todd Owens  Procedure(s) Performed: Left shoulder arthroscopy, debridement, subacromial decompression, distal clavicle resection (Left: Shoulder)     Patient location during evaluation: PACU Anesthesia Type: General Level of consciousness: awake and alert Pain management: pain level controlled Vital Signs Assessment: post-procedure vital signs reviewed and stable Respiratory status: spontaneous breathing, nonlabored ventilation, respiratory function stable and patient connected to nasal cannula oxygen Cardiovascular status: blood pressure returned to baseline and stable Postop Assessment: no apparent nausea or vomiting Anesthetic complications: yes   Encounter Notable Events  Notable Event Outcome Phase Comment  Difficult to intubate - expected  Intraprocedure Filed from anesthesia note documentation.    Last Vitals:  Vitals:   07/24/22 1717 07/24/22 1750  BP:  138/65  Pulse: 75 77  Resp: 16 18  Temp:  36.7 C  SpO2: (!) 86% 90%    Last Pain:  Vitals:   07/24/22 1750  TempSrc:   PainSc: 3                  Tiajuana Amass

## 2022-07-28 ENCOUNTER — Encounter (HOSPITAL_COMMUNITY): Payer: Self-pay | Admitting: Orthopedic Surgery

## 2022-08-05 ENCOUNTER — Encounter (HOSPITAL_COMMUNITY): Payer: Self-pay | Admitting: Orthopedic Surgery

## 2022-08-13 NOTE — Assessment & Plan Note (Signed)
Stable, chronic.  Continue current medication.   Losartan and HCTZ

## 2022-08-13 NOTE — Assessment & Plan Note (Signed)
Chronic, euvolemic in office today Reviewed last office visit from Dr. Saunders Revel January 23, 2022 Had side effects to Trulicity so changed to dapagliflozin to assist with management of heart failure.

## 2022-08-13 NOTE — Assessment & Plan Note (Signed)
Encouraged exercise, weight loss, healthy eating habits.  Did not tolerate GLP1 Trulicity.

## 2022-08-13 NOTE — Assessment & Plan Note (Signed)
CPAP.  

## 2022-08-13 NOTE — Assessment & Plan Note (Signed)
Due for reevaluation on rosuvastatin 40 mg daily with history of coronary artery disease.

## 2022-08-21 ENCOUNTER — Other Ambulatory Visit: Payer: Self-pay | Admitting: Family Medicine

## 2022-08-21 NOTE — Telephone Encounter (Signed)
Prescription Request  08/21/2022  Is this a "Controlled Substance" medicine? No  LOV: 07/03/2022  What is the name of the medication or equipment? losartan (COZAAR) 100 MG tablet [[id number]]   Have you contacted your pharmacy to request a refill? Yes. Pt states he was told by pharmacy they've sent multiple request for meds. Pt states he currently out of meds   Pt states pharmacy told him   Which pharmacy would you like this sent to?  [id number]: [phone]: [phone]     Patient notified that their request is being sent to the clinical staff for review and that they should receive a response within 2 business days.   Please advise at Mobile [phone](mobile)

## 2022-08-23 NOTE — Telephone Encounter (Signed)
Refill sent as requested. 

## 2022-09-27 ENCOUNTER — Other Ambulatory Visit: Payer: Self-pay | Admitting: Family Medicine

## 2022-10-02 ENCOUNTER — Other Ambulatory Visit: Payer: Self-pay | Admitting: Family Medicine

## 2022-10-20 NOTE — Progress Notes (Signed)
Voicemail left with Dr. Vanetta Shawl scheduler to put in orders for patients upcoming PAT appointment/surgery.

## 2022-10-20 NOTE — Pre-Procedure Instructions (Signed)
Surgical Instructions    Your procedure is scheduled on October 27, 2022.  Report to [facility] Main Entrance "A" at 5:30 A.M., then check in with the Admitting office.  Call this number if you have problems the morning of surgery:  [phone]  If you have any questions prior to your surgery date call [phone]: Open Monday-Friday 8am-4pm If you experience any cold or flu symptoms such as cough, fever, chills, shortness of breath, etc. between now and your scheduled surgery, please notify us at the above number.     Remember:  Do not eat after midnight the night before your surgery  You may drink clear liquids until 4:30 AM the morning of your surgery.   Clear liquids allowed are: Water, Non-Citrus Juices (without pulp), Carbonated Beverages, Clear Tea, Black Coffee Only (NO MILK, CREAM OR POWDERED CREAMER of any kind), and Gatorade.  Patient Instructions  The night before surgery:  No food after midnight. ONLY clear liquids after midnight  The day of surgery (if you do NOT have diabetes):  Drink ONE (1) Pre-Surgery Clear Ensure by 4:30 AM the morning of surgery. Drink in one sitting. Do not sip.  This drink was given to you during your hospital  pre-op appointment visit.  Nothing else to drink after completing the  Pre-Surgery Clear Ensure.         If you have questions, please contact your surgeon's office.      Take these medicines the morning of surgery with A SIP OF WATER:  omeprazole (PRILOSEC)   rosuvastatin (CRESTOR)     May take these medicines IF NEEDED:  acetaminophen (TYLENOL)   fluticasone (FLONASE) nasal spray   nitroGLYCERIN (NITROSTAT) Polyethyl Glycol-Propyl Glycol (LUBRICANT EYE DROPS) eye drops    Follow your surgeon's instructions on when to stop Aspirin.  If no instructions were given by your surgeon then you will need to call the office to get those instructions.     As of today, STOP taking any Aleve, Naproxen, Ibuprofen, Motrin, Advil,  Goody's, BC's, all herbal medications, fish oil, and all vitamins. This includes your medication: diclofenac Sodium (VOLTAREN) GEL                      Do NOT Smoke (Tobacco/Vaping) for 24 hours prior to your procedure.  If you use a CPAP at night, you may bring your mask/headgear for your overnight stay.   Contacts, glasses, piercing's, hearing aid's, dentures or partials may not be worn into surgery, please bring cases for these belongings.    For patients admitted to the hospital, discharge time will be determined by your treatment team.   Patients discharged the day of surgery will not be allowed to drive home, and someone needs to stay with them for 24 hours.  SURGICAL WAITING ROOM VISITATION Patients having surgery or a procedure may have no more than 2 support people in the waiting area - these visitors may rotate.   Children under the age of 44 must have an adult with them who is not the patient. If the patient needs to stay at the hospital during part of their recovery, the visitor guidelines for inpatient rooms apply. Pre-op nurse will coordinate an appropriate time for 1 support person to accompany patient in pre-op.  This support person may not rotate.   Please refer to the [facility] website for the visitor guidelines for Inpatients (after your surgery is over and you are in a regular room).    Special  instructions:   [place]- Preparing For Surgery  Before surgery, you can play an important role. Because skin is not sterile, your skin needs to be as free of germs as possible. You can reduce the number of germs on your skin by washing with CHG (chlorahexidine gluconate) Soap before surgery.  CHG is an antiseptic cleaner which kills germs and bonds with the skin to continue killing germs even after washing.    Oral Hygiene is also important to reduce your risk of infection.  Remember - BRUSH YOUR TEETH THE MORNING OF SURGERY WITH YOUR REGULAR TOOTHPASTE  Please do not use  if you have an allergy to CHG or antibacterial soaps. If your skin becomes reddened/irritated stop using the CHG.  Do not shave (including legs and underarms) for at least 48 hours prior to first CHG shower. It is OK to shave your face.  Please follow these instructions carefully.   Shower the NIGHT BEFORE SURGERY and the MORNING OF SURGERY  If you chose to wash your hair, wash your hair first as usual with your normal shampoo.  After you shampoo, rinse your hair and body thoroughly to remove the shampoo.  Use CHG Soap as you would any other liquid soap. You can apply CHG directly to the skin and wash gently with a scrungie or a clean washcloth.   Apply the CHG Soap to your body ONLY FROM THE NECK DOWN.  Do not use on open wounds or open sores. Avoid contact with your eyes, ears, mouth and genitals (private parts). Wash Face and genitals (private parts)  with your normal soap.   Wash thoroughly, paying special attention to the area where your surgery will be performed.  Thoroughly rinse your body with warm water from the neck down.  DO NOT shower/wash with your normal soap after using and rinsing off the CHG Soap.  Pat yourself dry with a CLEAN TOWEL.  Wear CLEAN PAJAMAS to bed the night before surgery  Place CLEAN SHEETS on your bed the night before your surgery  DO NOT SLEEP WITH PETS.   Day of Surgery: Take a shower with CHG soap. Do not wear jewelry or makeup Do not wear lotions, powders, perfumes/colognes, or deodorant. Do not shave 48 hours prior to surgery.  Men may shave face and neck. Do not bring valuables to the hospital.  [facility] is not responsible for any belongings or valuables. Do not wear nail polish, gel polish, artificial nails, or any other type of covering on natural nails (fingers and toes) If you have artificial nails or gel coating that need to be removed by a nail salon, please have this removed prior to surgery. Artificial nails or gel coating may  interfere with anesthesia's ability to adequately monitor your vital signs.  Wear Clean/Comfortable clothing the morning of surgery Remember to brush your teeth WITH YOUR REGULAR TOOTHPASTE.   Please read over the following fact sheets that you were given.    If you received a COVID test during your pre-op visit  it is requested that you wear a mask when out in public, stay away from anyone that may not be feeling well and notify your surgeon if you develop symptoms. If you have been in contact with anyone that has tested positive in the last 10 days please notify you surgeon.

## 2022-10-21 ENCOUNTER — Encounter (HOSPITAL_COMMUNITY): Payer: Self-pay

## 2022-10-21 ENCOUNTER — Encounter (HOSPITAL_COMMUNITY)
Admission: RE | Admit: 2022-10-21 | Discharge: 2022-10-21 | Disposition: A | Discharge status: Home / Self Care | Payer: BC Managed Care – PPO | Source: Ambulatory Visit | Attending: Orthopedic Surgery | Admitting: Orthopedic Surgery

## 2022-10-21 ENCOUNTER — Other Ambulatory Visit: Payer: Self-pay

## 2022-10-21 VITALS — BP 149/71 | HR 79 | Temp 98.3°F | Resp 19 | Ht 69.0 in | Wt 345.0 lb

## 2022-10-21 DIAGNOSIS — M25332 Other instability, left wrist: Secondary | ICD-10-CM | POA: Diagnosis not present

## 2022-10-21 DIAGNOSIS — I110 Hypertensive heart disease with heart failure: Secondary | ICD-10-CM | POA: Insufficient documentation

## 2022-10-21 DIAGNOSIS — G629 Polyneuropathy, unspecified: Secondary | ICD-10-CM | POA: Diagnosis not present

## 2022-10-21 DIAGNOSIS — G5622 Lesion of ulnar nerve, left upper limb: Secondary | ICD-10-CM | POA: Diagnosis not present

## 2022-10-21 DIAGNOSIS — I5032 Chronic diastolic (congestive) heart failure: Secondary | ICD-10-CM | POA: Diagnosis not present

## 2022-10-21 DIAGNOSIS — Z01818 Encounter for other preprocedural examination: Secondary | ICD-10-CM | POA: Insufficient documentation

## 2022-10-21 DIAGNOSIS — G5602 Carpal tunnel syndrome, left upper limb: Secondary | ICD-10-CM | POA: Insufficient documentation

## 2022-10-21 DIAGNOSIS — I2510 Atherosclerotic heart disease of native coronary artery without angina pectoris: Secondary | ICD-10-CM | POA: Diagnosis not present

## 2022-10-21 DIAGNOSIS — G4733 Obstructive sleep apnea (adult) (pediatric): Secondary | ICD-10-CM | POA: Diagnosis not present

## 2022-10-21 LAB — CBC
HCT: 41.1 % (ref 39.0–52.0)
Hemoglobin: 14.4 g/dL (ref 13.0–17.0)
MCH: 34.5 pg — ABNORMAL HIGH (ref 26.0–34.0)
MCHC: 35 g/dL (ref 30.0–36.0)
MCV: 98.6 fL (ref 80.0–100.0)
Platelets: 223 10*3/uL (ref 150–400)
RBC: 4.17 MIL/uL — ABNORMAL LOW (ref 4.22–5.81)
RDW: 13.5 % (ref 11.5–15.5)
WBC: 5.7 10*3/uL (ref 4.0–10.5)
nRBC: 0 % (ref 0.0–0.2)

## 2022-10-21 LAB — BASIC METABOLIC PANEL
Anion gap: 11 (ref 5–15)
BUN: 9 mg/dL (ref 6–20)
CO2: 26 mmol/L (ref 22–32)
Calcium: 9 mg/dL (ref 8.9–10.3)
Chloride: 102 mmol/L (ref 98–111)
Creatinine, Ser: 1.22 mg/dL (ref 0.61–1.24)
GFR, Estimated: >60 mL/min (ref >60)
Glucose, Bld: 141 mg/dL — ABNORMAL HIGH (ref 70–99)
Potassium: 3.5 mmol/L (ref 3.5–5.1)
Sodium: 139 mmol/L (ref 135–145)

## 2022-10-21 NOTE — Progress Notes (Signed)
PCP - Eliezer Lofts MD Cardiologist - End, Christopher   PPM/ICD - denies Device Orders -n/a  Rep Notified - n/a  Chest x-ray - 04/27/2016 EKG - 10/21/2022 Stress Test - 08/10/2016  ECHO - 09/20/2019 Cardiac Cath - 02/21/2021  Sleep Study - yes, OSA > 15 years per pt CPAP - yes  Fasting Blood Sugar - no DM   Last dose of GLP1 agonist- n/a  GLP1 instructions: n/a  Blood Thinner Instructions: n/a Aspirin Instructions: n/a  ERAS Protcol -yes PRE-SURGERY Ensure or G2- yes  COVID TEST- denies   Anesthesia review: yes (cardiac history)  Patient denies shortness of breath, fever, cough and chest pain at PAT appointment   All instructions explained to the patient, with a verbal understanding of the material. Patient agrees to go over the instructions while at home for a better understanding. Patient also instructed to self quarantine after being tested for COVID-19. The opportunity to ask questions was provided.

## 2022-10-22 NOTE — Progress Notes (Signed)
Anesthesia Chart Review:  Case: [id number] Date/Time: 10/27/22 0715   Procedures:      Left carpal tunnel release. Left ulnar nerve release at the elbow with anterior transposition as necessary and possible flexor pronator lengthening. Left wrist proximal row carpectomy versus 4 corner fusion with extensor pollicis longus transfer and posterior interosseous nerve neurectomy and stress radiography. (Left) - 3 hrs     ARTHRODESIS WRIST (Left: Wrist)   Anesthesia type: General   Pre-op diagnosis: Left carpal tunnel syndrome.  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  Left wrist instability with arthritic phenomenon ligamentous injury   Location: [facility] OR ROOM 04 / [place] [facility]   Surgeons: Roseanne Kaufman, MD       DISCUSSION: Patient is a 57 year old male scheduled for the above procedure.  History includes former smoker (quit 07/20/1993), HTN, CAD (nonobstructive moderate CAD, 60% pLAD, 50% Ramus Int 02/21/21, medical therapy), chronic HFpEF, OSA (moderate OSA 2017, uses CPAP), syncope (last 2022), osteoarthritis (s/p left shoulder arthroscopy/debridement 07/24/22), spinal surgery (right L4-5 microdiscectomy 12/23/01), morbid obesity.  Last cardiology follow-up was on 01/23/22 with Dr. Saunders Revel. No angina. Overall stable DOE. Continue medical therapy. 1 year follow-up planned.  Anesthesia team to evaluate on the day of surgery.     VS: BP (!) 149/71   Pulse 79   Temp 36.8 C   Resp 19   Ht 5\' 9"  (1.753 m)   Wt (!) 156.5 kg   SpO2 95%   BMI 50.95 kg/m    PROVIDERS: Jinny Sanders, MD is PCP  End, Harrell Gave, MD is cardiologist   LABS: Labs reviewed: Acceptable for surgery. A1c 6.3% 07/03/22. (all labs ordered are listed, but only abnormal results are displayed)  Labs Reviewed  BASIC METABOLIC PANEL - Abnormal; Notable for the following components:      Result Value   Glucose, Bld 141 (*)    All other components within normal limits  CBC - Abnormal; Notable for the following components:   RBC 4.17  (*)    MCH 34.5 (*)    All other components within normal limits    Split Night CPAP 11/22/207 IMPRESSIONS 1. Moderate obstructive sleep apnea occurred during the diagnostic portion of the study(AHI = 29.3/hour). An optimal PAP pressure was selected for this patient ( 11 cm of water) 2. No significant central sleep apnea occurred during the diagnostic portion of the study (CAI = 0.0/hour).  3. Moderate oxygen desaturation was noted during the diagnostic portion of the study (Min O2 =69.00%). 4. The patient snored with Loud snoring volume during the diagnostic portion of the study. 5. No cardiac abnormalities were noted during this study. 6. Clinically significant periodic limb movements did not occur during sleep.  RECOMMENDATIONS  1. Trial of autoCPAP therapy on 11-15 cm H2O with a Medium size Resmed Nasal Mask Airfit N20 mask and heated humidification...   IMAGES: Xray left wrist 04/13/22: IMPRESSION: 1. Significant widening of the scapholunate interval to 9 mm. Proximal migration of the capitate into the scapholunate interval. The lunate is also abnormally rotated, likely due to the proximal migration of the capitate. The findings are age indeterminate but may be nonacute. Recommend clinical correlation. 2. No acute fracture identified. 3. Degenerative changes between the base of the first metacarpal and capitate.     EKG: 10/21/2022: NSR   CV: Cardiac Cath 02/21/2021 Conclusions: Moderate, non-obstructive coronary artery disease with 60% proximal LAD and 50% proximal ramus intermedius lesions (neither of which are hemodynamically significant by RFR). Normal left  ventricular contraction with mildly elevated filling pressure consistent with diastolic dysfunction.  Recommendations: Continue medical therapy and risk factor modification to prevent progression of moderate coronary artery disease.    Long term monitor 01/24/2021 - 01/31/2021 The patient was monitored for 7 days, 3  hours. The predominant rhythm was sinus with an average rate of 69 bpm (range 43-142 bpm). There were rare PAC's and PVC's. Two atrial runs lasting up to 6 beats were observed with a maximum rate of 133 bpm. No sustained arrhythmia or prolonged pause was seen. Patient triggered events correspond to sinus rhythm.  Predominantly sinus rhythm with rare PAC' s and PVC's as well as brief atrial runs.  No significant arrhythmia noted.   Echo 09/20/2019 1. Very Poor echo images causing difficulty to evaluate wall motion  abnormalities. Overall EF appears normal.. Left ventricular ejection  fraction, by estimation, is 55 to 60%. The left ventricle has normal  function. Left ventricular endocardial border  not optimally defined to evaluate regional wall motion. There is unable to  assess left ventricular hypertrophy. Left ventricular diastolic function  could not be evaluated.   2. Right ventricular systolic function is normal. The right ventricular  size is normal. There is normal pulmonary artery systolic pressure.   3. The mitral valve is grossly normal. No evidence of mitral valve  regurgitation.   4. The aortic valve is grossly normal. Aortic valve regurgitation is not  visualized.   5. The inferior vena cava is normal in size with greater than 50%  respiratory variability, suggesting right atrial pressure of 3 mmHg.      Past Medical History:  Diagnosis Date   Acute sinusitis, unspecified    Arthritis    Coronary artery disease    Moderate, non-obstructive coronary artery disease with 60% proximal LAD and 50% proximal ramus intermedius lesions   Dermatophytosis of nail    Esophageal reflux    Essential hypertension, benign    Impotence of organic origin    Morbid obesity    Obstructive sleep apnea (adult) (pediatric)    Other and unspecified hyperlipidemia    Other malaise and fatigue    Other testicular hypofunction    Pure hypercholesterolemia    Rosacea    Routine general  medical examination at a health care facility    Sleep apnea    Syncope    last one in 2022    Past Surgical History:  Procedure Laterality Date   BACK SURGERY  07/20/2001   CARDIAC CATHETERIZATION  02/21/2021   cardiolyte  07/21/1999   neg   CORONARY PRESSURE/FFR STUDY N/A 02/21/2021   Procedure: INTRAVASCULAR PRESSURE WIRE/FFR STUDY;  Surgeon: Nelva Bush, MD;  Location: [place] [facility];  Service: Cardiovascular;  Laterality: N/A;   KNEE SURGERY Left 07/21/2003   LEFT HEART CATH AND CORONARY ANGIOGRAPHY N/A 02/21/2021   Procedure: LEFT HEART CATH AND CORONARY ANGIOGRAPHY;  Surgeon: Nelva Bush, MD;  Location: [place] [facility];  Service: Cardiovascular;  Laterality: N/A;   PFT  07/20/2005   WNL   SHOULDER ARTHROSCOPY Left 07/24/2022   Procedure: Left shoulder arthroscopy, debridement, subacromial decompression, distal clavicle resection;  Surgeon: Justice Britain, MD;  Location: [facility];  Service: Orthopedics;  Laterality: Left;   WRIST SURGERY Right     MEDICATIONS:  acetaminophen (TYLENOL) 500 MG tablet   aspirin EC (ASPIRIN LOW DOSE) 81 MG tablet   cyclobenzaprine (FLEXERIL) 10 MG tablet   diclofenac Sodium (VOLTAREN) 1 % GEL   FARXIGA 10 MG TABS  tablet   fluticasone (FLONASE) 50 MCG/ACT nasal spray   furosemide (LASIX) 40 MG tablet   ibuprofen (ADVIL) 200 MG tablet   losartan (COZAAR) 100 MG tablet   nitroGLYCERIN (NITROSTAT) 0.4 MG SL tablet   NON FORMULARY   omeprazole (PRILOSEC) 40 MG capsule   ondansetron (ZOFRAN) 4 MG tablet   oxyCODONE-acetaminophen (PERCOCET) 5-325 MG tablet   Polyethyl Glycol-Propyl Glycol (LUBRICANT EYE DROPS) 0.4-0.3 % SOLN   rosuvastatin (CRESTOR) 40 MG tablet   tadalafil (CIALIS) 5 MG tablet   No current facility-administered medications for this encounter.    Myra Gianotti, PA-C Surgical Short Stay/Anesthesiology [facility] Phone [phone] [facility] Phone [phone] 10/22/2022 11:32 AM

## 2022-10-22 NOTE — Anesthesia Preprocedure Evaluation (Addendum)
Anesthesia Evaluation  Patient identified by MRN, date of birth, ID band Patient awake    Reviewed: Allergy & Precautions, NPO status , Patient's Chart, lab work & pertinent test results  Airway Mallampati: IV  TM Distance: >3 FB Neck ROM: Full    Dental no notable dental hx.    Pulmonary sleep apnea , former smoker   Pulmonary exam normal        Cardiovascular hypertension, + CAD   Rhythm:Regular Rate:Normal     Neuro/Psych negative neurological ROS  negative psych ROS   GI/Hepatic Neg liver ROS,GERD  Medicated,,  Endo/Other    Morbid obesity  Renal/GU negative Renal ROS  negative genitourinary   Musculoskeletal  (+) Arthritis , Osteoarthritis,    Abdominal Normal abdominal exam  (+)   Peds  Hematology negative hematology ROS (+)   Anesthesia Other Findings   Reproductive/Obstetrics                             Anesthesia Physical Anesthesia Plan  ASA: 3  Anesthesia Plan: General and Regional   Post-op Pain Management: Regional block*, Celebrex PO (pre-op)* and Tylenol PO (pre-op)*   Induction: Intravenous  PONV Risk Score and Plan: 2 and Ondansetron, Dexamethasone, Midazolam and Treatment may vary due to age or medical condition  Airway Management Planned: Mask and Oral ETT  Additional Equipment: None  Intra-op Plan:   Post-operative Plan: Extubation in OR  Informed Consent: I have reviewed the patients History and Physical, chart, labs and discussed the procedure including the risks, benefits and alternatives for the proposed anesthesia with the patient or authorized representative who has indicated his/her understanding and acceptance.     Dental advisory given  Plan Discussed with: CRNA  Anesthesia Plan Comments: (PAT note written 10/22/2022 by Shonna Chock, PA-C.  )       Anesthesia Quick Evaluation

## 2022-10-27 ENCOUNTER — Encounter (HOSPITAL_COMMUNITY): Payer: Self-pay | Admitting: Orthopedic Surgery

## 2022-10-27 ENCOUNTER — Ambulatory Visit (HOSPITAL_COMMUNITY): Payer: No Typology Code available for payment source | Admitting: Vascular Surgery

## 2022-10-27 ENCOUNTER — Other Ambulatory Visit: Payer: Self-pay

## 2022-10-27 ENCOUNTER — Encounter (HOSPITAL_COMMUNITY)
Admission: RE | Disposition: A | Discharge status: Home / Self Care | Payer: Self-pay | Source: Home / Self Care | Attending: Orthopedic Surgery

## 2022-10-27 ENCOUNTER — Ambulatory Visit (HOSPITAL_COMMUNITY)
Admission: RE | Admit: 2022-10-27 | Discharge: 2022-10-27 | Disposition: A | Discharge status: Home / Self Care | Payer: No Typology Code available for payment source | Attending: Orthopedic Surgery | Admitting: Orthopedic Surgery

## 2022-10-27 ENCOUNTER — Ambulatory Visit (HOSPITAL_BASED_OUTPATIENT_CLINIC_OR_DEPARTMENT_OTHER): Payer: No Typology Code available for payment source | Admitting: Anesthesiology

## 2022-10-27 DIAGNOSIS — M1990 Unspecified osteoarthritis, unspecified site: Secondary | ICD-10-CM | POA: Insufficient documentation

## 2022-10-27 DIAGNOSIS — G5602 Carpal tunnel syndrome, left upper limb: Secondary | ICD-10-CM | POA: Diagnosis not present

## 2022-10-27 DIAGNOSIS — Z79899 Other long term (current) drug therapy: Secondary | ICD-10-CM | POA: Diagnosis not present

## 2022-10-27 DIAGNOSIS — G4733 Obstructive sleep apnea (adult) (pediatric): Secondary | ICD-10-CM | POA: Diagnosis not present

## 2022-10-27 DIAGNOSIS — G5622 Lesion of ulnar nerve, left upper limb: Secondary | ICD-10-CM | POA: Diagnosis not present

## 2022-10-27 DIAGNOSIS — I10 Essential (primary) hypertension: Secondary | ICD-10-CM | POA: Insufficient documentation

## 2022-10-27 DIAGNOSIS — M25332 Other instability, left wrist: Secondary | ICD-10-CM | POA: Insufficient documentation

## 2022-10-27 DIAGNOSIS — Z87891 Personal history of nicotine dependence: Secondary | ICD-10-CM

## 2022-10-27 DIAGNOSIS — Z6841 Body Mass Index (BMI) 40.0 and over, adult: Secondary | ICD-10-CM | POA: Diagnosis not present

## 2022-10-27 DIAGNOSIS — K219 Gastro-esophageal reflux disease without esophagitis: Secondary | ICD-10-CM | POA: Insufficient documentation

## 2022-10-27 DIAGNOSIS — I2510 Atherosclerotic heart disease of native coronary artery without angina pectoris: Secondary | ICD-10-CM | POA: Diagnosis not present

## 2022-10-27 HISTORY — PX: WRIST FUSION: SHX839

## 2022-10-27 HISTORY — PX: CARPAL TUNNEL WITH CUBITAL TUNNEL: SHX5608

## 2022-10-27 SURGERY — RELEASE, CARPAL TUNNEL AND CUBITAL TUNNEL
Anesthesia: Regional | Site: Wrist | Laterality: Left

## 2022-10-27 MED ORDER — LACTATED RINGERS IV SOLN: INTRAVENOUS | Status: DC

## 2022-10-27 MED ORDER — ROCURONIUM BROMIDE 10 MG/ML (PF) SYRINGE
PREFILLED_SYRINGE | INTRAVENOUS | Status: AC
  Filled 2022-10-27: qty 10

## 2022-10-27 MED ORDER — CEFAZOLIN IN SODIUM CHLORIDE 3-0.9 GM/100ML-% IV SOLN
3.0000 g | INTRAVENOUS | Status: AC
  Administered 2022-10-27: 3 g via INTRAVENOUS
  Filled 2022-10-27: qty 100

## 2022-10-27 MED ORDER — SUGAMMADEX SODIUM 200 MG/2ML IV SOLN
INTRAVENOUS | Status: DC | PRN
  Administered 2022-10-27: 308.4 mg via INTRAVENOUS

## 2022-10-27 MED ORDER — BUPIVACAINE HCL (PF) 0.25 % IJ SOLN
INTRAMUSCULAR | Status: AC
  Filled 2022-10-27: qty 30

## 2022-10-27 MED ORDER — LIDOCAINE 2% (20 MG/ML) 5 ML SYRINGE
INTRAMUSCULAR | Status: DC | PRN
  Administered 2022-10-27: 80 mg via INTRAVENOUS

## 2022-10-27 MED ORDER — MIDAZOLAM HCL 2 MG/2ML IJ SOLN
INTRAMUSCULAR | Status: DC | PRN
  Administered 2022-10-27: 2 mg via INTRAVENOUS

## 2022-10-27 MED ORDER — 0.9 % SODIUM CHLORIDE (POUR BTL) OPTIME
TOPICAL | Status: DC | PRN
  Administered 2022-10-27: 1000 mL

## 2022-10-27 MED ORDER — FENTANYL CITRATE (PF) 100 MCG/2ML IJ SOLN: 25.0000 ug | INTRAMUSCULAR | Status: DC | PRN

## 2022-10-27 MED ORDER — ACETAMINOPHEN 10 MG/ML IV SOLN: 1000.0000 mg | Freq: Once | INTRAVENOUS | Status: DC | PRN

## 2022-10-27 MED ORDER — ONDANSETRON HCL 4 MG/2ML IJ SOLN
INTRAMUSCULAR | Status: DC | PRN
  Administered 2022-10-27: 4 mg via INTRAVENOUS

## 2022-10-27 MED ORDER — PROPOFOL 10 MG/ML IV BOLUS
INTRAVENOUS | Status: DC | PRN
  Administered 2022-10-27: 200 mg via INTRAVENOUS

## 2022-10-27 MED ORDER — PHENYLEPHRINE HCL-NACL 20-0.9 MG/250ML-% IV SOLN
INTRAVENOUS | Status: DC | PRN
  Administered 2022-10-27: 50 ug/min via INTRAVENOUS

## 2022-10-27 MED ORDER — ROCURONIUM BROMIDE 10 MG/ML (PF) SYRINGE
PREFILLED_SYRINGE | INTRAVENOUS | Status: DC | PRN
  Administered 2022-10-27: 100 mg via INTRAVENOUS

## 2022-10-27 MED ORDER — SUCCINYLCHOLINE CHLORIDE 200 MG/10ML IV SOSY
PREFILLED_SYRINGE | INTRAVENOUS | Status: DC | PRN
  Administered 2022-10-27: 200 mg via INTRAVENOUS

## 2022-10-27 MED ORDER — EPHEDRINE SULFATE-NACL 50-0.9 MG/10ML-% IV SOSY
PREFILLED_SYRINGE | INTRAVENOUS | Status: DC | PRN
  Administered 2022-10-27: 10 mg via INTRAVENOUS

## 2022-10-27 MED ORDER — PROPOFOL 500 MG/50ML IV EMUL
INTRAVENOUS | Status: DC | PRN
  Administered 2022-10-27: 50 ug/kg/min via INTRAVENOUS

## 2022-10-27 MED ORDER — DEXAMETHASONE SODIUM PHOSPHATE 10 MG/ML IJ SOLN
INTRAMUSCULAR | Status: DC | PRN
  Administered 2022-10-27: 10 mg via INTRAVENOUS

## 2022-10-27 MED ORDER — FENTANYL CITRATE (PF) 250 MCG/5ML IJ SOLN
INTRAMUSCULAR | Status: DC | PRN
  Administered 2022-10-27: 100 ug via INTRAVENOUS

## 2022-10-27 MED ORDER — FENTANYL CITRATE (PF) 250 MCG/5ML IJ SOLN
INTRAMUSCULAR | Status: AC
  Filled 2022-10-27: qty 5

## 2022-10-27 MED ORDER — ORAL CARE MOUTH RINSE: 15.0000 mL | Freq: Once | OROMUCOSAL | Status: AC

## 2022-10-27 MED ORDER — CHLORHEXIDINE GLUCONATE 0.12 % MT SOLN
15.0000 mL | Freq: Once | OROMUCOSAL | Status: AC
  Administered 2022-10-27: 15 mL via OROMUCOSAL
  Filled 2022-10-27: qty 15

## 2022-10-27 MED ORDER — MIDAZOLAM HCL 2 MG/2ML IJ SOLN
INTRAMUSCULAR | Status: AC
  Filled 2022-10-27: qty 2

## 2022-10-27 SURGICAL SUPPLY — 50 items
BAG COUNTER SPONGE SURGICOUNT (BAG) ×2 IMPLANT
BAG SPNG CNTER NS LX DISP (BAG) ×2
BNDG CMPR 5X3 KNIT ELC UNQ LF (GAUZE/BANDAGES/DRESSINGS) ×4
BNDG ELASTIC 3INX 5YD STR LF (GAUZE/BANDAGES/DRESSINGS) ×4 IMPLANT
BNDG ELASTIC 4X5.8 VLCR STR LF (GAUZE/BANDAGES/DRESSINGS) ×2 IMPLANT
BNDG GAUZE DERMACEA FLUFF 4 (GAUZE/BANDAGES/DRESSINGS) ×6 IMPLANT
BNDG GZE DERMACEA 4 6PLY (GAUZE/BANDAGES/DRESSINGS) ×6
CORD BIPOLAR FORCEPS 12FT (ELECTRODE) ×2 IMPLANT
COVER SURGICAL LIGHT HANDLE (MISCELLANEOUS) ×2 IMPLANT
CUFF TOURN SGL QUICK 18X4 (TOURNIQUET CUFF) ×2 IMPLANT
CUFF TOURN SGL QUICK 24 (TOURNIQUET CUFF)
CUFF TRNQT CYL 24X4X16.5-23 (TOURNIQUET CUFF) IMPLANT
DRAPE SURG 17X23 STRL (DRAPES) ×2 IMPLANT
EVACUATOR 1/8 PVC DRAIN (DRAIN) IMPLANT
GAUZE SPONGE 4X4 12PLY STRL (GAUZE/BANDAGES/DRESSINGS) ×2 IMPLANT
GAUZE XEROFORM 1X8 LF (GAUZE/BANDAGES/DRESSINGS) ×2 IMPLANT
GLOVE BIOGEL M 8.0 STRL (GLOVE) ×2 IMPLANT
GLOVE SS BIOGEL STRL SZ 8 (GLOVE) ×2 IMPLANT
GOWN STRL REUS W/ TWL LRG LVL3 (GOWN DISPOSABLE) ×4 IMPLANT
GOWN STRL REUS W/ TWL XL LVL3 (GOWN DISPOSABLE) ×6 IMPLANT
GOWN STRL REUS W/TWL LRG LVL3 (GOWN DISPOSABLE) ×4
GOWN STRL REUS W/TWL XL LVL3 (GOWN DISPOSABLE) ×6
KIT BASIN OR (CUSTOM PROCEDURE TRAY) ×2 IMPLANT
KIT TURNOVER KIT B (KITS) ×2 IMPLANT
LOOP VASCLR MAXI BLUE 18IN ST (MISCELLANEOUS) IMPLANT
LOOP VASCULAR MAXI 18 BLUE (MISCELLANEOUS)
LOOPS VASCLR MAXI BLUE 18IN ST (MISCELLANEOUS) IMPLANT
NDL HYPO 25GX1X1/2 BEV (NEEDLE) IMPLANT
NEEDLE HYPO 25GX1X1/2 BEV (NEEDLE) IMPLANT
NS IRRIG 1000ML POUR BTL (IV SOLUTION) ×2 IMPLANT
PACK ORTHO EXTREMITY (CUSTOM PROCEDURE TRAY) ×2 IMPLANT
PAD ARMBOARD 7.5X6 YLW CONV (MISCELLANEOUS) ×4 IMPLANT
PAD CAST 4YDX4 CTTN HI CHSV (CAST SUPPLIES) ×4 IMPLANT
PADDING CAST COTTON 4X4 STRL (CAST SUPPLIES) ×4
SOL PREP POV-IOD 4OZ 10% (MISCELLANEOUS) ×6 IMPLANT
SUCTION FRAZIER HANDLE 10FR (MISCELLANEOUS)
SUCTION TUBE FRAZIER 10FR DISP (MISCELLANEOUS) IMPLANT
SUT PROLENE 4 0 PS 2 18 (SUTURE) ×2 IMPLANT
SUT VIC AB 2-0 CT1 27 (SUTURE)
SUT VIC AB 2-0 CT1 TAPERPNT 27 (SUTURE) IMPLANT
SUT VIC AB 3-0 FS2 27 (SUTURE) IMPLANT
SYR CONTROL 10ML LL (SYRINGE) IMPLANT
SYSTEM CHEST DRAIN TLS 7FR (DRAIN) IMPLANT
TOWEL GREEN STERILE (TOWEL DISPOSABLE) ×2 IMPLANT
TOWEL GREEN STERILE FF (TOWEL DISPOSABLE) ×2 IMPLANT
TUBE CONNECTING 12X1/4 (SUCTIONS) IMPLANT
TUBE EVACUATION TLS (MISCELLANEOUS) ×2 IMPLANT
UNDERPAD 30X36 HEAVY ABSORB (UNDERPADS AND DIAPERS) ×2 IMPLANT
VASCULAR TIE MAXI BLUE 18IN ST (MISCELLANEOUS)
WATER STERILE IRR 1000ML POUR (IV SOLUTION) ×2 IMPLANT

## 2022-10-27 NOTE — Anesthesia Procedure Notes (Signed)
Procedure Name: Intubation Date/Time: 10/27/2022 7:31 AM  Performed by: Darryl Nestle, CRNAPre-anesthesia Checklist: Patient identified, Emergency Drugs available, Suction available and Patient being monitored Patient Re-evaluated:Patient Re-evaluated prior to induction Oxygen Delivery Method: Circle system utilized Preoxygenation: Pre-oxygenation with 100% oxygen Induction Type: IV induction Ventilation: Mask ventilation without difficulty Laryngoscope Size: Mac, 3 and Glidescope Grade View: Grade I Tube type: Oral Tube size: 7.0 mm Number of attempts: 1 Airway Equipment and Method: Stylet and Oral airway Placement Confirmation: ETT inserted through vocal cords under direct vision, positive ETCO2 and breath sounds checked- equal and bilateral Secured at: 23 cm Tube secured with: Tape Dental Injury: Teeth and Oropharynx as per pre-operative assessment

## 2022-10-27 NOTE — Op Note (Signed)
Operative note 10/27/2022  Reford Olliff MD.  Date of operation and dictation 10/27/2022.  Preoperative diagnosis: Traumatic injury left upper extremity with history of shoulder surgery.  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  Left carpal tunnel syndrome.  Left ulnar nerve compression at the elbow.  Both median nerve compression and the ulnar nerve compression are noted objectively subjectively and electrodiagnostically. Patient has an unstable wrist based upon plain film radiograph clinical objective and radiologic studies.  Postop diagnosis: The same  Operative procedure #1 left open carpal tunnel release #2 left ulnar nerve release in situ at the elbow with noted anconeus epitrochlear [place] #3 proximal row carpectomy left wrist (removal of the scaphoid lunate and triquetrum).  #4 extensor pollicis longus tendon transfer dorsal radially #5 posterior interosseous nerve neurectomy left wrist #6 5 view radiographic series/fluoroscopy left wrist performed examined and interpreted by myself  Anesthesia General with preoperative block  Estimated blood loss minimal.  Tourniquet time less than 2 hours.  Complications none.  Operative procedure in detail: Patient was taken to the procedure suite he was counseled extensively in the preop holding area.  Arm was marked 3 g of Ancef were given and he was given a block by anesthesia left upper extremity.  Following this he was taken to the operative theater and underwent a smooth induction of general anesthetic.  He was laid spine appropriately padded and underwent Hibiclens scrub followed by Betadine scrub and paint followed by sterile field being secured.  At this time final timeout was observed and the patient underwent an incision with 250 mm with tourniquet control about the transverse carpal ligament.  Dissection was carried down carefully through the skin subtenons tissue.  He  had an apparent branch formation and high takeoff of the ulnar artery which was rather ulnar base.  I very carefully dissected around this.  Following this a very carefully identified the transverse carpal ligament and released this under 4.5 expanded loupe magnification.  He had a palmaris brevis muscle.  This was all resected and released nicely.  The arterial structures distally including superficial palmar arch were carefully protected.  Following this I then dissected proximally and released the proximal leaflet and portions of the antebrachial fascia.  He had some thickening in the fascia proximally thus I made a small 1 cm incision transversely in the area 2 fingerbreadths proximal to the distal wrist crease and made sure the antebrachial fascia was completely released.  This was an uncomplicated carpal tunnel release.  This area was irrigated hemostasis secured and the wound closed ultimately with Prolene.  Following this attention was turned towards the cubital tunnel.  Curvilinear incision was made.  Given the patient's large girth and adiposity we are very careful to protect the medial antebrachial cutaneous nerve branches and medial brachial cutaneous nerve branches.  The patient had the arcade of Struthers released proximally followed by the medial muscular septum followed by the cubital tunnel and Osborne's ligament as well as the 2 heads of the FCU.  I should note there is an anconeus epitrochlear Aris muscle present which was resected.  The ulnar nerve was carefully protected at all times and completely released about all 5 sites of compression.  I had no subluxation tendency and was nicely decompressed.  I was pleased with this.  Given the large girth and the position I felt that a in situ release would be very advantageous and did not transpose the nerve.  This area was irrigated and checked multiple times followed by closure with Prolene.  No deep sutures were placed.  Patient tolerated  this well.  Following this I performed a utilitarian dorsal incision.  Dissection was carried down a very carefully performed Z-lengthening of the retinaculum followed by EPL decompression and anterior radial transfer.  The patient had the EPL transfer dorsal radially without difficulty.  Following this interval between the second and fourth compartments was created in the posterior interosseous nerve underwent a correction and cauterization technique.  This was a PIN neurectomy.  The patient tolerated this well.  Following this we then entered the capsule.  The patient tolerated this nicely there were no complicating features.  Once the capsule was entered I then remove the scaphoid and identified the capitate head which had excellent cartilage.  Following this we made the decision for [facility] rather than a 4 corner fusion based upon the excellent status of the capitate head.  The patient was not a candidate for scapholunate reconstruction due to early degenerative changes and a fixed position of the scaphoid and lunate which was rather advanced.  I this time remove the lunate followed by the triquetrum.  The capitate head sat nicely in the lunate fossa.  This completed the [facility].  The radioscaphocapitate ligament and the palmar floor was carefully protected at all times.  The TFC was carefully protected.  Following this we irrigated and checked x-rays.  The x-rays look excellent.  We then closed the capsule with FiberWire suture followed by irrigation and Z-lengthening closure of the retinaculum over the fourth dorsal compartment with the EPL left transposed.  The patient tolerated this well and there were no complicating features.  Following final irrigation we then closed the wound.  The patient was placed in a sugar-tong splint without difficulty.  The sugar-tong splint allowed for small arc range of motion about the elbow and The wrist still.  I should note that the x-rays findings and  interoperative look was quite stable as it relates to the [facility] and the capitate position in the lunate fossa.  Following this the patient then underwent placement of the arm in a foam support.  Will recommend elevation and very close edema control with massage range of motion and elevation.  I will see him back in the office in 14 days.  We have discussed all issues.  It was a pleasure to see him and discussed with patient care plan.  Dominica Severin, MD

## 2022-10-27 NOTE — Discharge Instructions (Signed)
Please make sure to elevate move and massage her fingers.  You may use the foam pillow to help decrease the amount of swelling in your hand.  If you have any problems please call us.  When you are up and ambulatory use the sling to protect your arm.  If you have emergencies please call Dr. Amanda Pea at [phone]  We recommend that you to take vitamin C 1000 mg a day to promote healing. We also recommend that if you require  pain medicine that you take a stool softener to prevent constipation as most pain medicines will have constipation side effects. We recommend either Peri-Colace or Senokot and recommend that you also consider adding MiraLAX as well to prevent the constipation affects from pain medicine if you are required to use them. These medicines are over the counter and may be purchased at a local pharmacy. A cup of yogurt and a probiotic can also be helpful during the recovery process as the medicines can disrupt your intestinal environment.Keep bandage clean and dry.  Call for any problems.  No smoking.  Criteria for driving a car: you should be off your pain medicine for 7-8 hours, able to drive one handed(confident), thinking clearly and feeling able in your judgement to drive. Continue elevation as it will decrease swelling.  If instructed by MD move your fingers within the confines of the bandage/splint.  Use ice if instructed by your MD. Call immediately for any sudden loss of feeling in your hand/arm or change in functional abilities of the extremity.

## 2022-10-27 NOTE — Anesthesia Procedure Notes (Signed)
Anesthesia Regional Block: Supraclavicular block   Pre-Anesthetic Checklist: , timeout performed,  Correct Patient, Correct Site, Correct Laterality,  Correct Procedure, Correct Position, site marked,  Risks and benefits discussed,  Surgical consent,  Pre-op evaluation,  At surgeon's request and post-op pain management  Laterality: Left  Prep: Dura Prep       Needles:  Injection technique: Single-shot  Needle Type: Echogenic Stimulator Needle     Needle Length: 5cm  Needle Gauge: 20     Additional Needles:   Procedures:,,,, ultrasound used (permanent image in chart),,    Narrative:  Start time: 10/27/2022 7:02 AM End time: 10/27/2022 7:10 AM Injection made incrementally with aspirations every 5 mL.  Performed by: Personally  Anesthesiologist: Atilano Median, DO  Additional Notes: Patient identified. Risks/Benefits/Options discussed with patient including but not limited to bleeding, infection, nerve damage, failed block, incomplete pain control. Patient expressed understanding and wished to proceed. All questions were answered. Sterile technique was used throughout the entire procedure. Please see nursing notes for vital signs. Aspirated in 5cc intervals with injection for negative confirmation. Patient was given instructions on fall risk and not to get out of bed. All questions and concerns addressed with instructions to call with any issues or inadequate analgesia.

## 2022-10-27 NOTE — Transfer of Care (Signed)
Immediate Anesthesia Transfer of Care Note  Patient: Todd Owens  Procedure(s) Performed: Left carpal tunnel release. Left ulnar nerve release at the elbow with anterior transposition as necessary and possible flexor pronator lengthening. Left wrist proximal row carpectomy versus 4 corner fusion with extensor pollicis longus transfer and posterior interosseous nerve neurectomy and stress radiography. (Left: Arm Upper) ARTHRODESIS WRIST (Left: Wrist)  Patient Location: PACU  Anesthesia Type:GA combined with regional for post-op pain  Level of Consciousness: awake, alert , and oriented  Airway & Oxygen Therapy: Patient Spontanous Breathing  Post-op Assessment: Report given to RN and Post -op Vital signs reviewed and stable  Post vital signs: Reviewed and stable  Last Vitals:  Vitals Value Taken Time  BP 108/78   Temp 98   Pulse 71 10/27/22 0955  Resp 15 10/27/22 0955  SpO2 92 % 10/27/22 0955  Vitals shown include unvalidated device data.  Last Pain:  Vitals:   10/27/22 0611  TempSrc: Oral  PainSc:          Complications: No notable events documented.

## 2022-10-27 NOTE — H&P (Signed)
Todd Owens is an 57 y.o. male.   Chief Complaint: Patient presents for left upper extremity surgical reconstruction HPI: The patient presents for left upper extremity ulnar nerve release with anterior transposition and flexor pronator lengthening as necessary.  Left carpal tunnel release.  Left wrist reconstruction with PRC versus 4 corner fusion as interoperative symptoms dictate.  Patient understands all issues risk and benefits.  With this in mind we will proceed accordingly.  Patient presents for evaluation and treatment of the of their upper extremity predicament. The patient denies neck, back, chest or  abdominal pain. The patient notes that they have no lower extremity problems. The patients primary complaint is noted. We are planning surgical care pathway for the upper extremity.  I discussed with patient all issues.  Will plan to proceed with surgical reconstruction about the upper extremity and  Past Medical History:  Diagnosis Date  . Acute sinusitis, unspecified   . Arthritis   . Coronary artery disease    Moderate, non-obstructive coronary artery disease with 60% proximal LAD and 50% proximal ramus intermedius lesions  . Dermatophytosis of nail   . Esophageal reflux   . Essential hypertension, benign   . Impotence of organic origin   . Morbid obesity   . Obstructive sleep apnea (adult) (pediatric)   . Other and unspecified hyperlipidemia   . Other malaise and fatigue   . Other testicular hypofunction   . Pure hypercholesterolemia   . Rosacea   . Routine general medical examination at a health care facility   . Sleep apnea   . Syncope    last one in 2022    Past Surgical History:  Procedure Laterality Date  . BACK SURGERY  07/20/2001  . CARDIAC CATHETERIZATION  02/21/2021  . cardiolyte  07/21/1999   neg  . CORONARY PRESSURE/FFR STUDY N/A 02/21/2021   Procedure: INTRAVASCULAR PRESSURE WIRE/FFR STUDY;  Surgeon: Yvonne Kendall, MD;  Location: [facility] INVASIVE CV [facility];   Service: Cardiovascular;  Laterality: N/A;  . KNEE SURGERY Left 07/21/2003  . LEFT HEART CATH AND CORONARY ANGIOGRAPHY N/A 02/21/2021   Procedure: LEFT HEART CATH AND CORONARY ANGIOGRAPHY;  Surgeon: Yvonne Kendall, MD;  Location: [facility] INVASIVE CV LAB;  Service: Cardiovascular;  Laterality: N/A;  . PFT  07/20/2005   WNL  . SHOULDER ARTHROSCOPY Left 07/24/2022   Procedure: Left shoulder arthroscopy, debridement, subacromial decompression, distal clavicle resection;  Surgeon: Francena Hanly, MD;  Location: [facility] ORS;  Service: Orthopedics;  Laterality: Left;  . WRIST SURGERY Right     Family History  Problem Relation Age of Onset  . Hypertension Mother   . Heart disease Mother        pacemaker  . Hypertension Father   . Stroke Father   . Heart disease Father 74  . Heart attack Father   . Heart disease Sister   . Heart attack Sister 81  . Heart failure Sister   . Colon cancer Neg Hx   . Esophageal cancer Neg Hx   . Liver cancer Neg Hx   . Pancreatic cancer Neg Hx   . Stomach cancer Neg Hx   . Rectal cancer Neg Hx    Social History:  reports that he quit smoking about 29 years ago. His smoking use included cigarettes. He has a 15.00 pack-year smoking history. He has quit using smokeless tobacco.  His smokeless tobacco use included chew. He reports current alcohol use. He reports that he does not use drugs.  Allergies: No Known Allergies  Medications  Prior to Admission  Medication Sig Dispense Refill  . acetaminophen (TYLENOL) 500 MG tablet Take 500-1,000 mg by mouth every 6 (six) hours as needed (pain.).    Marland Kitchen fluticasone (FLONASE) 50 MCG/ACT nasal spray PLACE 1 SPRAY INTO BOTH NOSTRILS 2 (TWO) TIMES DAILY (Patient taking differently: Place 1 spray into both nostrils daily as needed for allergies.) 16 mL 5  . furosemide (LASIX) 40 MG tablet TAKE 1 TABLET BY MOUTH EVERY DAY 90 tablet 2  . ibuprofen (ADVIL) 200 MG tablet Take 200-400 mg by mouth every 8 (eight) hours as needed (pain.).     Marland Kitchen losartan (COZAAR) 100 MG tablet TAKE 1 TABLET BY MOUTH EVERY DAY 90 tablet 3  . nitroGLYCERIN (NITROSTAT) 0.4 MG SL tablet Place 0.4 mg under the tongue every 5 (five) minutes as needed for chest pain.    Marland Kitchen omeprazole (PRILOSEC) 40 MG capsule TAKE 1 CAPSULE BY MOUTH EVERY DAY 90 capsule 1  . rosuvastatin (CRESTOR) 40 MG tablet TAKE 1 TABLET BY MOUTH EVERY DAY 90 tablet 2  . aspirin EC (ASPIRIN LOW DOSE) 81 MG tablet TAKE 1 TABLET BY MOUTH EVERY DAY 90 tablet 2  . cyclobenzaprine (FLEXERIL) 10 MG tablet Take 1 tablet (10 mg total) by mouth 3 (three) times daily as needed for muscle spasms. (Patient not taking: Reported on 10/16/2022) 30 tablet 1  . diclofenac Sodium (VOLTAREN) 1 % GEL APPLY 4 GRAMS TOPICALLY 4 TIMES DAILY**ASPIRIN & NSAIDS ORALLY NOT RECOMMENDED GIVEN CARDIAC ISSUES (Patient taking differently: Apply 4 g topically daily as needed (knee pain).) 100 g 2  . FARXIGA 10 MG TABS tablet TAKE 1 TABLET BY MOUTH DAILY BEFORE BREAKFAST. (Patient not taking: Reported on 10/16/2022) 30 tablet 10  . NON FORMULARY Pt uses a cpap nightly    . ondansetron (ZOFRAN) 4 MG tablet Take 1 tablet (4 mg total) by mouth every 8 (eight) hours as needed for nausea or vomiting. (Patient not taking: Reported on 10/16/2022) 10 tablet 0  . oxyCODONE-acetaminophen (PERCOCET) 5-325 MG tablet Take 1 tablet by mouth every 4 (four) hours as needed (max 6 q). (Patient not taking: Reported on 10/16/2022) 20 tablet 0  . Polyethyl Glycol-Propyl Glycol (LUBRICANT EYE DROPS) 0.4-0.3 % SOLN Place 1-2 drops into both eyes 3 (three) times daily as needed (dry/irritated eyes.).    Marland Kitchen tadalafil (CIALIS) 5 MG tablet Take 1-4 tablets (5-20 mg total) by mouth daily as needed for erectile dysfunction. (Patient not taking: Reported on 10/16/2022) 30 tablet 11    No results found for this or any previous visit (from the past 48 hour(s)). No results found.  Review of Systems  Eyes: Negative.   Respiratory: Negative.    Endocrine:  Negative.    Blood pressure 139/88, pulse 64, temperature 98.3 F (36.8 C), temperature source Oral, resp. rate 18, height 5\' 9"  (1.753 m), weight (!) 154.2 kg, SpO2 94 %. Physical Exam  The patient is alert and oriented in no acute distress. The patient complains of pain in the affected upper extremity.  The patient is noted to have a normal HEENT exam. Lung fields show equal chest expansion and no shortness of breath. Abdomen exam is nontender without distention. Lower extremity examination does not show any fracture dislocation or blood clot symptoms. Pelvis is stable and the neck and back are stable and nontender.  Patient discussed all issues  Patient has significant scapholunate dissociation with abnormality and carpal as well as cubital tunnel symptomatology.  We discussed relevant issues findings and concerns as  it relates to the surgical care plan and pathways.  Will proceed accordingly with the above-mentioned surgical reconstructive efforts. Assessment/Plan The patient and myself have discussed all issues.  Will plan for left carpal tunnel release, left ulnar nerve release with anterior transposition as necessary and left wrist reconstruction with PRC versus 4 corner fusion and repair as necessary.  With all issues in mind we will proceed accordingly.  We are planning surgery for your upper extremity. The risk and benefits of surgery to include risk of bleeding, infection, anesthesia,  damage to normal structures and failure of the surgery to accomplish its intended goals of relieving symptoms and restoring function have been discussed in detail. With this in mind we plan to proceed. I have specifically discussed with the patient the pre-and postoperative regime and the dos and don'ts and risk and benefits in great detail. Risk and benefits of surgery also include risk of dystrophy(CRPS), chronic nerve pain, failure of the healing process to go onto completion and other inherent risks of  surgery The relavent the pathophysiology of the disease/injury process, as well as the alternatives for treatment and postoperative course of action has been discussed in great detail with the patient who desires to proceed.  We will do everything in our power to help you (the patient) restore function to the upper extremity. It is a pleasure to see this patient today.   Oletta CohnWilliam M Bee Hammerschmidt III, MD 10/27/2022, 6:25 AM

## 2022-10-28 ENCOUNTER — Encounter (HOSPITAL_COMMUNITY): Payer: Self-pay | Admitting: Orthopedic Surgery

## 2022-10-28 NOTE — Anesthesia Postprocedure Evaluation (Signed)
Anesthesia Post Note  Patient: Todd Owens  Procedure(s) Performed: Left carpal tunnel release. Left ulnar nerve release at the elbow with anterior transposition as necessary and possible flexor pronator lengthening. Left wrist proximal row carpectomy versus 4 corner fusion with extensor pollicis longus transfer and posterior interosseous nerve neurectomy and stress radiography. (Left: Arm Upper) ARTHRODESIS WRIST (Left: Wrist)     Patient location during evaluation: PACU Anesthesia Type: Regional and General Level of consciousness: awake and alert Pain management: pain level controlled Vital Signs Assessment: post-procedure vital signs reviewed and stable Respiratory status: spontaneous breathing, nonlabored ventilation, respiratory function stable and patient connected to nasal cannula oxygen Cardiovascular status: blood pressure returned to baseline and stable Postop Assessment: no apparent nausea or vomiting Anesthetic complications: no   No notable events documented.  Last Vitals:  Vitals:   10/27/22 1100 10/27/22 1115  BP: 108/65 (!) 144/111  Pulse: (!) 58 (!) 56  Resp: 14 15  Temp:  36.5 C  SpO2: 91% 94%    Last Pain:  Vitals:   10/27/22 1115  TempSrc:   PainSc: 0-No pain                 Earl Lites P Tesha Archambeau

## 2023-02-08 ENCOUNTER — Encounter: Payer: Self-pay | Admitting: Family Medicine

## 2023-02-08 ENCOUNTER — Ambulatory Visit: Payer: BC Managed Care – PPO | Admitting: Family Medicine

## 2023-02-08 VITALS — BP 118/74 | HR 62 | Temp 98.0°F | Ht 69.5 in | Wt 350.5 lb

## 2023-02-08 DIAGNOSIS — H6011 Cellulitis of right external ear: Secondary | ICD-10-CM | POA: Diagnosis not present

## 2023-02-08 MED ORDER — CEPHALEXIN 500 MG PO CAPS: 1000.0000 mg | ORAL_CAPSULE | Freq: Two times a day (BID) | ORAL | 0 refills | Status: DC

## 2023-02-08 NOTE — Progress Notes (Signed)
Todd [name], MD, CAQ Sports Medicine [facility] at [facility] [address] [place], [address]  Phone: [phone]  FAX: [phone]  Todd COURINGTON - 57 y.o. male  MRN [id number]  Date of Birth: 1966/07/11  Date: 02/08/2023  PCP: Todd Seltzer, MD  Referral: Todd Seltzer, MD  Chief Complaint  Patient presents with   Facial Swelling    Right Ear    Subjective:   Todd Owens is a 57 y.o. very pleasant male patient with Body mass index is 51.02 kg/m. who presents with the following:  Sore in the front of his ear.  He has some swelling and redness on the outer ear.  He has not had any kind of injury or trauma.  He does have a small sore in the anterior aspect of the ear externally.  It is minimally tender to palpation.  Not sure what antibiotics he is taking.  He had some leftover at home.  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 PULM: Breathing comfortably in no respiratory distress PSYCH: Normally interactive.   Tympanic membrane's clear bilaterally.  The left ear is entirely nontender, not red, and appears entirely normal externally.  The right ear is a small elevated sore on the anterior aspect of the ear centrally, and he also has some redness and swelling about much of the upper outer ear.  Laboratory and Imaging Data:  Assessment and Plan:     ICD-10-CM   1. Cellulitis of auricle of right ear  H60.11      Anticipate that he will do well with basic antibiotics.  Medication Management during today's office visit: Meds ordered this encounter  Medications   cephALEXin (KEFLEX)  500 MG capsule    Sig: Take 2 capsules (1,000 mg total) by mouth 2 (two) times daily.    Dispense:  40 capsule    Refill:  0   Medications Discontinued During This Encounter  Medication Reason   aspirin EC (ASPIRIN LOW DOSE) 81 MG tablet Completed Course   cyclobenzaprine (FLEXERIL) 10 MG tablet No longer needed (for PRN medications)   ondansetron (ZOFRAN) 4 MG tablet No longer needed (for PRN medications)   oxyCODONE-acetaminophen (PERCOCET) 5-325 MG tablet No longer needed (for PRN medications)   tadalafil (CIALIS) 5 MG tablet Completed Course   fluticasone (FLONASE) 50 MCG/ACT nasal spray Duplicate    Orders placed today for conditions managed today: No orders of the defined types were placed in this encounter.   Disposition: No follow-ups on file.  Dragon [employer] One speech-to-text software was used for transcription in this dictation.  Possible transcriptional errors can occur using Animal nutritionist.   Signed,  Todd Owens. Tommy Minichiello, MD   Outpatient Encounter Medications as of 02/08/2023  Medication Sig   acetaminophen (TYLENOL) 500 MG tablet Take 500-1,000 mg by mouth every 6 (six) hours as needed (pain.).   cephALEXin (KEFLEX) 500 MG capsule Take 2 capsules (1,000 mg total) by mouth 2 (two) times daily.   fluticasone (FLONASE) 50 MCG/ACT nasal  spray Place 2 sprays into both nostrils daily as needed for allergies or rhinitis.   furosemide (LASIX) 40 MG tablet TAKE 1 TABLET BY MOUTH EVERY DAY   ibuprofen (ADVIL) 200 MG tablet Take 200-400 mg by mouth every 8 (eight) hours as needed (pain.).   losartan (COZAAR) 100 MG tablet TAKE 1 TABLET BY MOUTH EVERY DAY   nitroGLYCERIN (NITROSTAT) 0.4 MG SL tablet Place 0.4 mg under the tongue every 5 (five) minutes as needed for chest pain.   NON FORMULARY Pt uses a cpap nightly   omeprazole (PRILOSEC) 40 MG capsule TAKE 1 CAPSULE BY MOUTH EVERY DAY   Polyethyl Glycol-Propyl Glycol (LUBRICANT EYE DROPS) 0.4-0.3 % SOLN Place 1-2 drops into  both eyes 3 (three) times daily as needed (dry/irritated eyes.).   rosuvastatin (CRESTOR) 40 MG tablet TAKE 1 TABLET BY MOUTH EVERY DAY   [DISCONTINUED] fluticasone (FLONASE) 50 MCG/ACT nasal spray PLACE 1 SPRAY INTO BOTH NOSTRILS 2 (TWO) TIMES DAILY (Patient taking differently: Place 1 spray into both nostrils daily as needed for allergies.)   diclofenac Sodium (VOLTAREN) 1 % GEL APPLY 4 GRAMS TOPICALLY 4 TIMES DAILY**ASPIRIN & NSAIDS ORALLY NOT RECOMMENDED GIVEN CARDIAC ISSUES   FARXIGA 10 MG TABS tablet TAKE 1 TABLET BY MOUTH DAILY BEFORE BREAKFAST. (Patient not taking: Reported on 10/16/2022)   [DISCONTINUED] aspirin EC (ASPIRIN LOW DOSE) 81 MG tablet TAKE 1 TABLET BY MOUTH EVERY DAY (Patient not taking: Reported on 02/08/2023)   [DISCONTINUED] cyclobenzaprine (FLEXERIL) 10 MG tablet Take 1 tablet (10 mg total) by mouth 3 (three) times daily as needed for muscle spasms. (Patient not taking: Reported on 10/16/2022)   [DISCONTINUED] ondansetron (ZOFRAN) 4 MG tablet Take 1 tablet (4 mg total) by mouth every 8 (eight) hours as needed for nausea or vomiting. (Patient not taking: Reported on 10/16/2022)   [DISCONTINUED] oxyCODONE-acetaminophen (PERCOCET) 5-325 MG tablet Take 1 tablet by mouth every 4 (four) hours as needed (max 6 q). (Patient not taking: Reported on 10/16/2022)   [DISCONTINUED] tadalafil (CIALIS) 5 MG tablet Take 1-4 tablets (5-20 mg total) by mouth daily as needed for erectile dysfunction. (Patient not taking: Reported on 10/16/2022)   No facility-administered encounter medications on file as of 02/08/2023.

## 2023-03-30 ENCOUNTER — Other Ambulatory Visit: Payer: Self-pay | Admitting: Family Medicine

## 2023-04-08 ENCOUNTER — Encounter: Payer: Self-pay | Admitting: Family Medicine

## 2023-04-08 ENCOUNTER — Ambulatory Visit: Payer: BC Managed Care – PPO | Admitting: Family Medicine

## 2023-04-08 VITALS — BP 120/62 | HR 62 | Temp 99.0°F | Ht 69.5 in | Wt 348.0 lb

## 2023-04-08 DIAGNOSIS — R062 Wheezing: Secondary | ICD-10-CM

## 2023-04-08 DIAGNOSIS — J208 Acute bronchitis due to other specified organisms: Secondary | ICD-10-CM | POA: Diagnosis not present

## 2023-04-08 DIAGNOSIS — R051 Acute cough: Secondary | ICD-10-CM

## 2023-04-08 LAB — POC COVID19 BINAXNOW: SARS Coronavirus 2 Ag: NEGATIVE

## 2023-04-08 LAB — POC INFLUENZA A&B (BINAX/QUICKVUE)
Influenza A, POC: NEGATIVE
Influenza B, POC: NEGATIVE

## 2023-04-08 MED ORDER — DOXYCYCLINE HYCLATE 100 MG PO TABS
100.0000 mg | ORAL_TABLET | Freq: Two times a day (BID) | ORAL | 0 refills | Status: DC
Start: 2023-04-08 — End: 2023-07-07

## 2023-04-08 MED ORDER — PREDNISONE 20 MG PO TABS
40.0000 mg | ORAL_TABLET | Freq: Every day | ORAL | 0 refills | Status: DC
Start: 2023-04-08 — End: 2023-07-07

## 2023-04-08 NOTE — Progress Notes (Signed)
Todd Owens. Todd Valenta, MD, CAQ Sports Medicine [facility] at [facility] [address] [place], [address]  Phone: [phone]  FAX: [phone]  ELLIAN Owens - 57 y.o. male  MRN [id number]  Date of Birth: 05-Dec-1965  Date: 04/08/2023  PCP: Excell Seltzer, MD  Referral: Excell Seltzer, MD  Chief Complaint  Patient presents with   Cough    Yellow/Brown Phlegm x 1 week   Scratchy Throat   Shortness of Breath        Subjective:   Todd Owens is a 57 y.o. very pleasant male patient with Body mass index is 50.65 kg/m. who presents with the following:  He is a very pleasant 57 year old gentleman who I recall from prior office visits, he presents with 1 week history of a productive cough that is of yellowish and brown sputum.  Initially, he thought that he may have had some irritated sinuses from mowing the grass, but his scratchy throat has progressed and now he is also having some shortness of breath.  He does not have any fever, chills, sweats or diffuse polyarthralgia. No nausea, vomiting, or diarrhea  He is eating and drinking relatively well.  H/o CHF  Thought that it was sinuses from him mowing, but started off as a Todd Owens in his throat  Mucous is really bad  Sob now  Quite smoking many  No lung disorders    Review of Systems is noted in the HPI, as appropriate  Objective:   BP 120/62 (BP Location: Right Arm, Patient Position: Sitting, Cuff Size: Large)   Pulse 62   Temp 99 F (37.2 C) (Temporal)   Ht 5' 9.5" (1.765 m)   Wt (!) 348 lb (157.9 kg)   SpO2 95%   BMI 50.65 kg/m    Gen: WDWN, NAD. Globally Non-toxic HEENT: Throat clear, w/o exudate, R TM clear, L TM - good landmarks, No fluid present. rhinnorhea.  MMM Frontal sinuses: NT Max sinuses: NT NECK: Anterior cervical  LAD is absent CV: RRR, No M/G/R, cap refill <2 sec PULM: Breathing comfortably in no respiratory distress.  He does have some diffuse mild wheezing  throughout both lung fields.  Laboratory and Imaging Data:  Assessment and Plan:     ICD-10-CM   1. Acute bronchitis due to other specified organisms  J20.8     2. Acute cough  R05.1 POC COVID-19    POC Influenza A&B (Binax test)    3. Wheezing  R06.2      We will go ahead and cover for atypicals.  With his significant bilateral wheezing, also going to place the patient on some steroids.  Medication Management during today's office visit: Meds ordered this encounter  Medications   doxycycline (VIBRA-TABS) 100 MG tablet    Sig: Take 1 tablet (100 mg total) by mouth 2 (two) times daily.    Dispense:  20 tablet    Refill:  0   predniSONE (DELTASONE) 20 MG tablet    Sig: Take 2 tablets (40 mg total) by mouth daily.    Dispense:  14 tablet    Refill:  0   Medications Discontinued During This Encounter  Medication Reason   cephALEXin (KEFLEX) 500 MG capsule Completed Course   FARXIGA 10 MG TABS tablet Completed Course    Orders placed today for conditions managed today: Orders Placed This Encounter  Procedures   POC COVID-19   POC Influenza A&B (Binax test)    Disposition: No  follow-ups on file.  Dragon [employer] One speech-to-text software was used for transcription in this dictation.  Possible transcriptional errors can occur using Animal nutritionist.   Signed,  Elpidio Galea. Evian Derringer, MD   Outpatient Encounter Medications as of 04/08/2023  Medication Sig   acetaminophen (TYLENOL) 500 MG tablet Take 500-1,000 mg by mouth every 6 (six) hours as needed (pain.).   diclofenac Sodium (VOLTAREN) 1 % GEL APPLY 4 GRAMS TOPICALLY 4 TIMES DAILY**ASPIRIN & NSAIDS ORALLY NOT RECOMMENDED GIVEN CARDIAC ISSUES   doxycycline (VIBRA-TABS) 100 MG tablet Take 1 tablet (100 mg total) by mouth 2 (two) times daily.   fluticasone (FLONASE) 50 MCG/ACT nasal spray Place 2 sprays into both nostrils daily as needed for allergies or rhinitis.   furosemide (LASIX) 40 MG tablet TAKE 1 TABLET BY MOUTH  EVERY DAY   ibuprofen (ADVIL) 200 MG tablet Take 200-400 mg by mouth every 8 (eight) hours as needed (pain.).   losartan (COZAAR) 100 MG tablet TAKE 1 TABLET BY MOUTH EVERY DAY   nitroGLYCERIN (NITROSTAT) 0.4 MG SL tablet Place 0.4 mg under the tongue every 5 (five) minutes as needed for chest pain.   NON FORMULARY Pt uses a cpap nightly   omeprazole (PRILOSEC) 40 MG capsule TAKE 1 CAPSULE BY MOUTH EVERY DAY   Polyethyl Glycol-Propyl Glycol (LUBRICANT EYE DROPS) 0.4-0.3 % SOLN Place 1-2 drops into both eyes 3 (three) times daily as needed (dry/irritated eyes.).   predniSONE (DELTASONE) 20 MG tablet Take 2 tablets (40 mg total) by mouth daily.   rosuvastatin (CRESTOR) 40 MG tablet TAKE 1 TABLET BY MOUTH EVERY DAY   [DISCONTINUED] cephALEXin (KEFLEX) 500 MG capsule Take 2 capsules (1,000 mg total) by mouth 2 (two) times daily.   [DISCONTINUED] FARXIGA 10 MG TABS tablet TAKE 1 TABLET BY MOUTH DAILY BEFORE BREAKFAST. (Patient not taking: Reported on 10/16/2022)   No facility-administered encounter medications on file as of 04/08/2023.

## 2023-04-13 IMAGING — CT CT TEMPORAL BONES W/ CM
3 of 6 series · 15 of 40 positions shown, 18 images · IV contrast (agent unspecified)
Comparison: None Available.

CLINICAL DATA: Right ear swelling

EXAM:
CT TEMPORAL BONES WITH CONTRAST
TECHNIQUE: Axial and coronal plane CT imaging of the petrous temporal bones was
performed with thin-collimation image reconstruction following
intravenous contrast administration. Multiplanar CT image
reconstructions were also generated.

[Series 6: cor mag left · coronal · 0.17mm/px · 2 of 143 slices shown]
[im 48/143  bone]
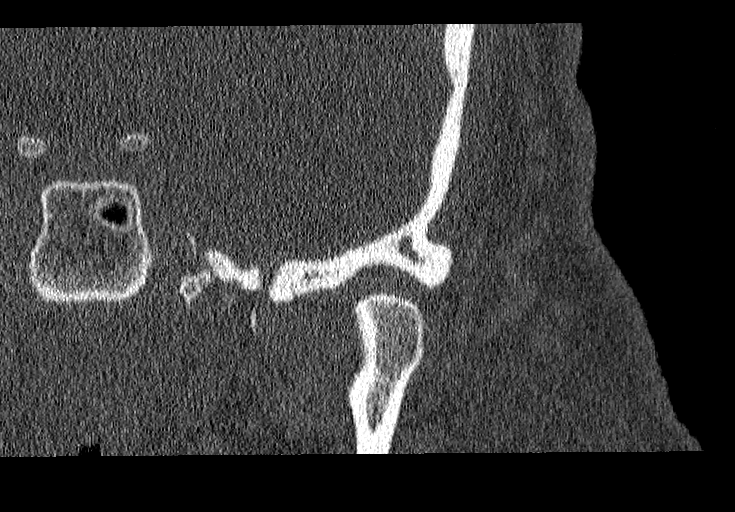
[im 95/143  bone]
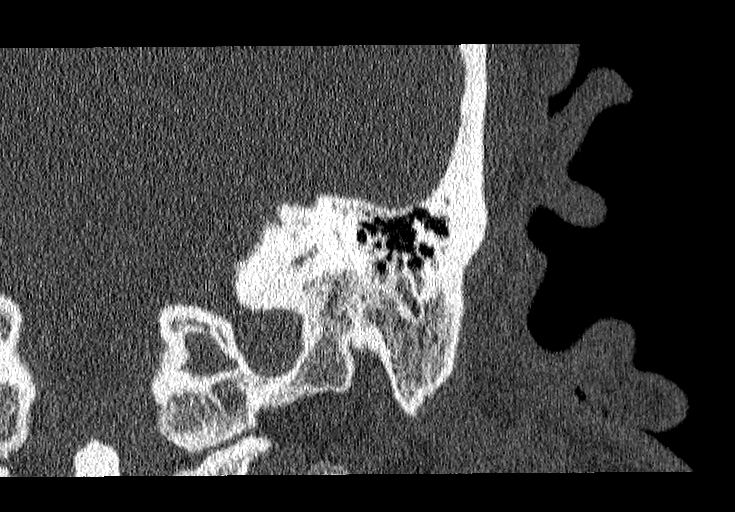

[Series 8: ax mag right · axial · 0.21mm/px · z∈[-332,-274]mm · 11 of 117 slices shown, 14 images]
[im 10/117  brain]
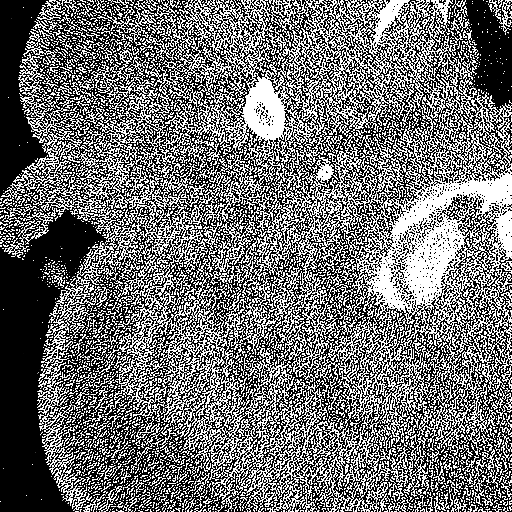
[im 10/117  bone]
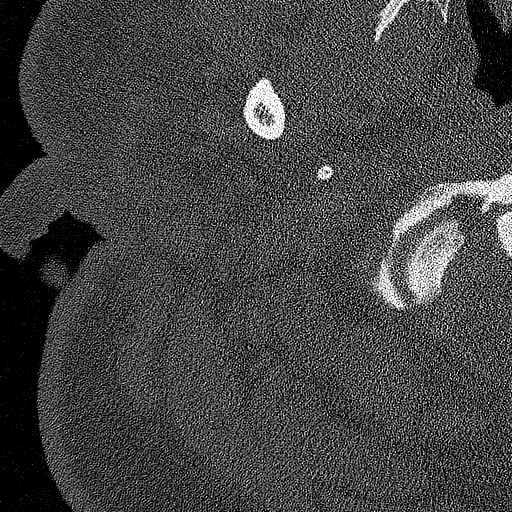
[im 20/117  bone]
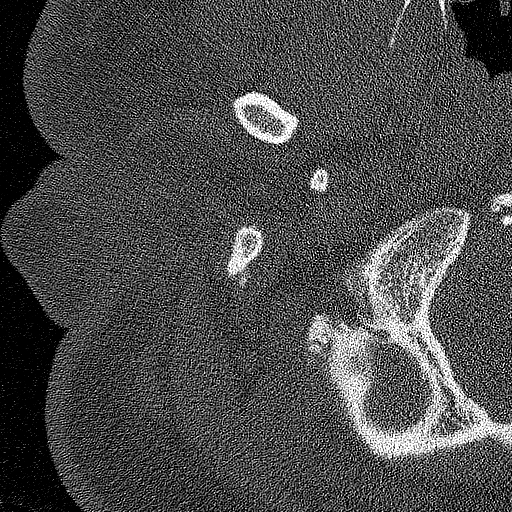
[im 30/117  bone]
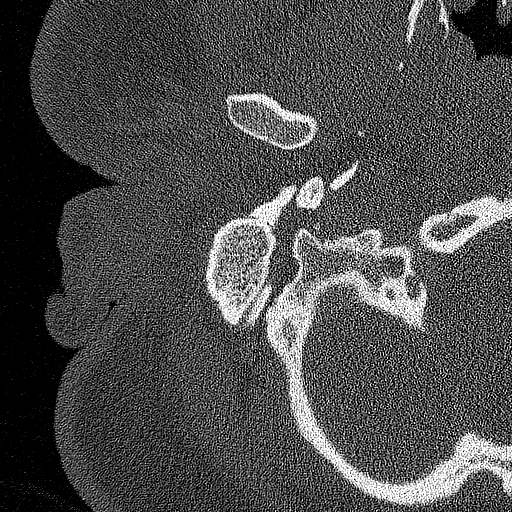
[im 39/117  bone]
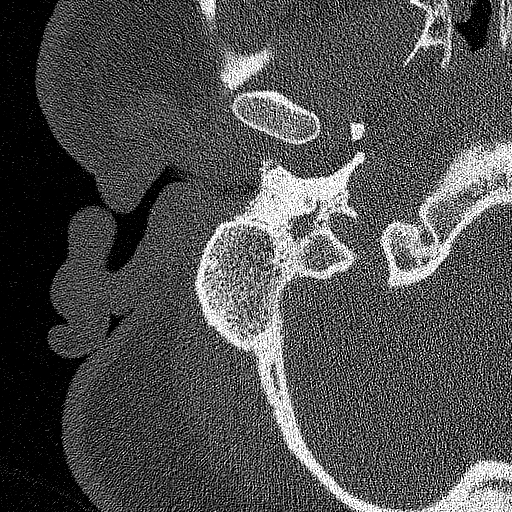
[im 49/117  brain]
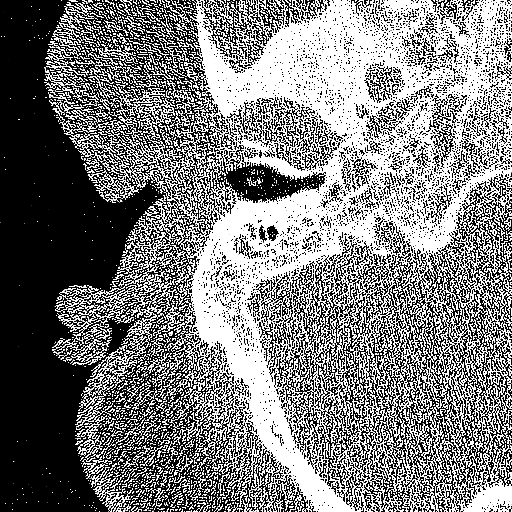
[im 49/117  bone]
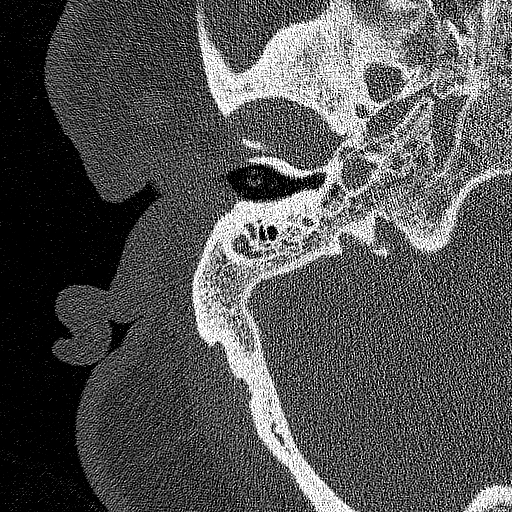
[im 59/117  bone]
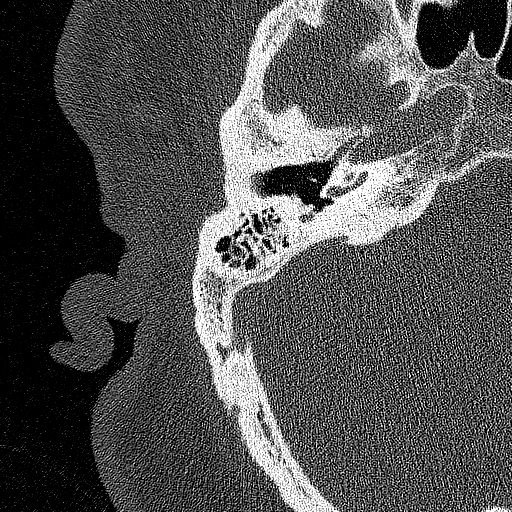
[im 68/117  bone]
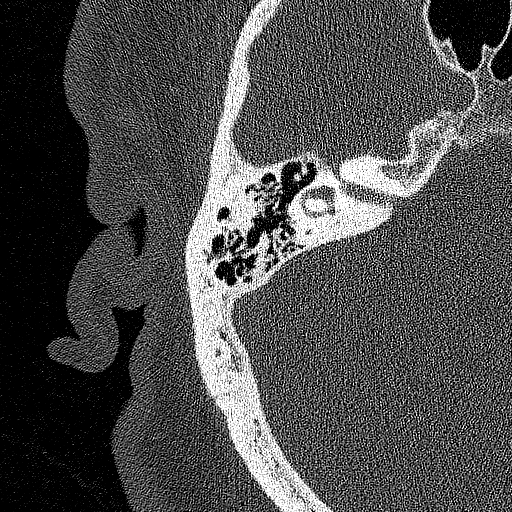
[im 78/117  bone]
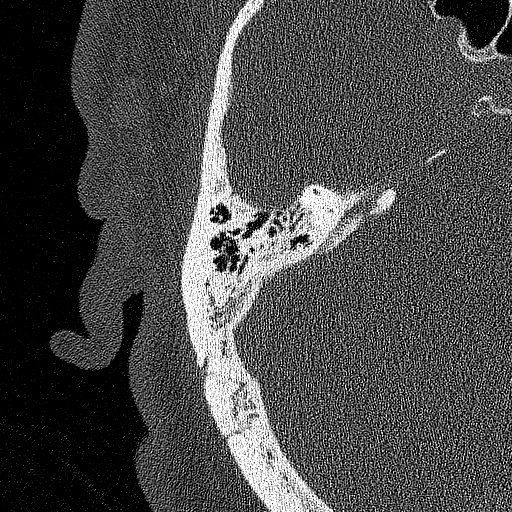
[im 88/117  brain]
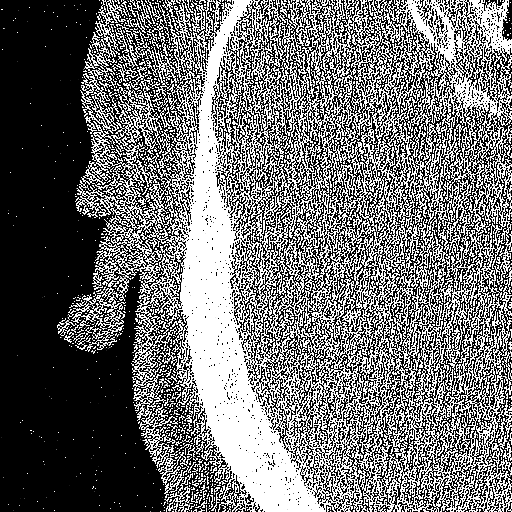
[im 88/117  bone]
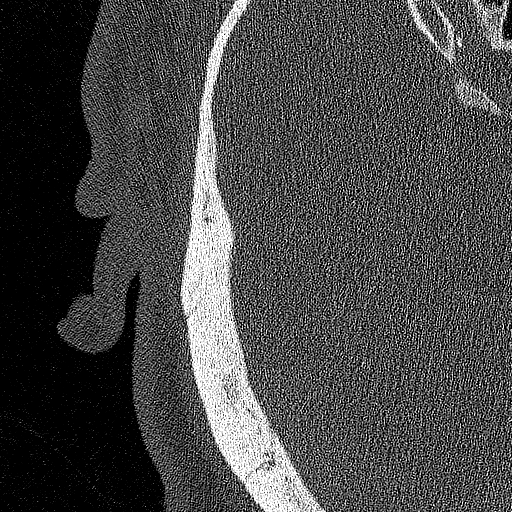
[im 97/117  bone]
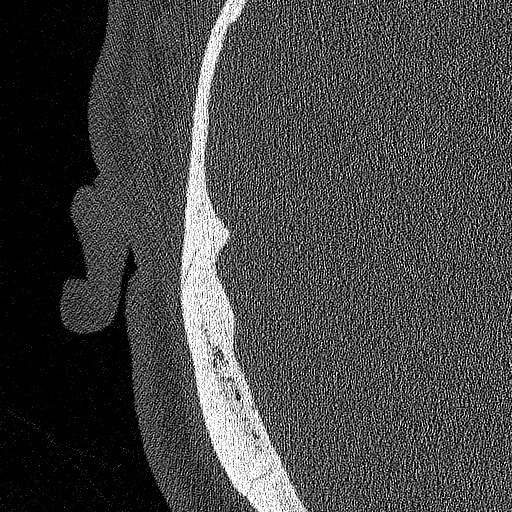
[im 107/117  bone]
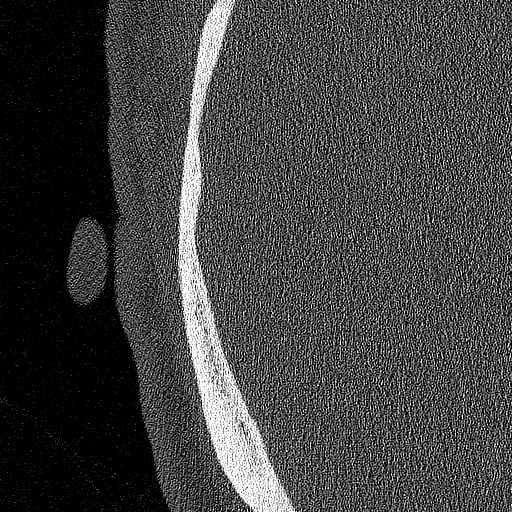

[Series 10: ax soft (correct) · axial · 0.43mm/px · z∈[-316,-294]mm · 2 of 35 slices shown]
[im 12/35  brain]
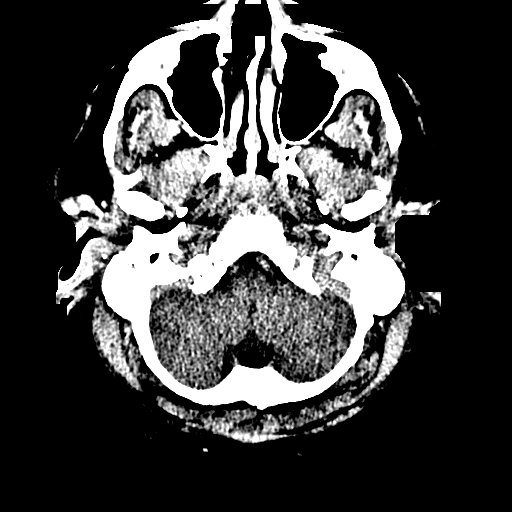
[im 23/35  brain]
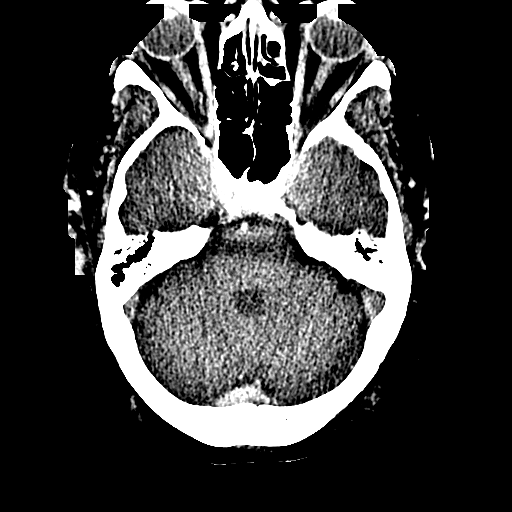

[15 of 40 positions shown; findings below may reference images not displayed]

RADIATION DOSE REDUCTION: This exam was performed according to the
departmental dose-optimization program which includes automated
exposure control, adjustment of the mA and/or kV according to
patient size and/or use of iterative reconstruction technique.

CONTRAST:  100mL OMNIPAQUE IOHEXOL 300 MG/ML  SOLN
FINDINGS: RIGHT TEMPORAL BONE

External auditory canal: Cerumen in the external auditory canal.

Middle ear cavity: Normally aerated. The scutum and ossicles are
normal. The tegmen tympani is intact.

Inner ear structures: The cochlea, vestibule and semicircular canals
are normal. The vestibular aqueduct is not enlarged.

Internal auditory and facial nerve canals:  Normal

Mastoid air cells: Minimal opacification of the inferior mastoid tip

LEFT TEMPORAL BONE

External auditory canal: Normal.

Middle ear cavity: Normally aerated. The scutum and ossicles are
normal. The tegmen tympani is intact.

Inner ear structures: The cochlea, vestibule and semicircular canals
are normal. The vestibular aqueduct is not enlarged.

Internal auditory and facial nerve canals:  Normal.

Mastoid air cells: Normally aerated. No osseous erosion.

Vascular: Normal non-contrast appearance of the carotid canals,
jugular bulbs and sigmoid plates.

Limited intracranial:  No acute or significant finding.

Visible orbits/paranasal sinuses: No acute or significant finding.

Soft tissues: Normal.
IMPRESSION: Unremarkable CT of the temporal bones. Cerumen within the right
external auditory canal.

## 2023-04-19 ENCOUNTER — Encounter (HOSPITAL_BASED_OUTPATIENT_CLINIC_OR_DEPARTMENT_OTHER): Payer: Self-pay | Admitting: *Deleted

## 2023-04-19 ENCOUNTER — Other Ambulatory Visit: Payer: Self-pay

## 2023-04-19 ENCOUNTER — Telehealth: Payer: Self-pay | Admitting: Family Medicine

## 2023-04-19 ENCOUNTER — Emergency Department (HOSPITAL_BASED_OUTPATIENT_CLINIC_OR_DEPARTMENT_OTHER): Payer: BC Managed Care – PPO | Admitting: Radiology

## 2023-04-19 DIAGNOSIS — I2510 Atherosclerotic heart disease of native coronary artery without angina pectoris: Secondary | ICD-10-CM | POA: Diagnosis not present

## 2023-04-19 DIAGNOSIS — R059 Cough, unspecified: Secondary | ICD-10-CM | POA: Insufficient documentation

## 2023-04-19 LAB — CBC
HCT: 40.5 % (ref 39.0–52.0)
Hemoglobin: 13.8 g/dL (ref 13.0–17.0)
MCH: 33.3 pg (ref 26.0–34.0)
MCHC: 34.1 g/dL (ref 30.0–36.0)
MCV: 97.6 fL (ref 80.0–100.0)
Platelets: 216 10*3/uL (ref 150–400)
RBC: 4.15 MIL/uL — ABNORMAL LOW (ref 4.22–5.81)
RDW: 14.5 % (ref 11.5–15.5)
WBC: 6.9 10*3/uL (ref 4.0–10.5)
nRBC: 0 % (ref 0.0–0.2)

## 2023-04-19 LAB — BASIC METABOLIC PANEL
Anion gap: 8 (ref 5–15)
BUN: 10 mg/dL (ref 6–20)
CO2: 29 mmol/L (ref 22–32)
Calcium: 8.9 mg/dL (ref 8.9–10.3)
Chloride: 99 mmol/L (ref 98–111)
Creatinine, Ser: 1.02 mg/dL (ref 0.61–1.24)
GFR, Estimated: >60 mL/min (ref >60)
Glucose, Bld: 153 mg/dL — ABNORMAL HIGH (ref 70–99)
Potassium: 3.7 mmol/L (ref 3.5–5.1)
Sodium: 136 mmol/L (ref 135–145)

## 2023-04-19 MED ORDER — ALBUTEROL SULFATE HFA 108 (90 BASE) MCG/ACT IN AERS: 2.0000 | INHALATION_SPRAY | RESPIRATORY_TRACT | Status: DC | PRN

## 2023-04-19 NOTE — ED Triage Notes (Signed)
Pt was dx with bronchitis a week ago and placed on abt and prednisone.  Pt is not getting better, has some sob with ambulation for a week.  Pt was negative for flu and covid.  Rib cage soreness with coughing.  Spo2 95% on RA in triage

## 2023-04-19 NOTE — Telephone Encounter (Signed)
FYI: This call has been transferred to Access Nurse. Once the result note has been entered staff can address the message at that time.  Patient called in with the following symptoms:  Red Word: sob    Please advise at Mobile [phone] (mobile)  Message is routed to Provider Pool and [facility] Triage    Pt called to let Dr. Patsy Lager know that his symptoms are the same & the meds, predniSONE (DELTASONE) 20 MG tablet, aren't working. Pt states he has sob whenever he walks. Pt mentioned Dr. Patsy Lager diagnosed him with bronchitis on 9/19. Transferred pt to access nurse.

## 2023-04-19 NOTE — Telephone Encounter (Signed)
Per access nurse note pt agreed to go for eval at ED. Sending note to Dr Ermalene Searing and [place] pool.

## 2023-04-19 NOTE — Telephone Encounter (Signed)
Agree patient needs ER visit

## 2023-04-20 ENCOUNTER — Emergency Department (HOSPITAL_BASED_OUTPATIENT_CLINIC_OR_DEPARTMENT_OTHER)
Admission: EM | Admit: 2023-04-20 | Discharge: 2023-04-20 | Disposition: A | Discharge status: Home / Self Care | Payer: BC Managed Care – PPO | Attending: Emergency Medicine | Admitting: Emergency Medicine

## 2023-04-20 DIAGNOSIS — J069 Acute upper respiratory infection, unspecified: Secondary | ICD-10-CM

## 2023-04-20 MED ORDER — GUAIFENESIN-CODEINE 100-10 MG/5ML PO SOLN
10.0000 mL | Freq: Four times a day (QID) | ORAL | 0 refills | Status: DC | PRN
Start: 2023-04-20 — End: 2023-07-07

## 2023-04-20 MED ORDER — AZITHROMYCIN 250 MG PO TABS: 250.0000 mg | ORAL_TABLET | Freq: Every day | ORAL | 0 refills | Status: DC

## 2023-04-20 MED ORDER — AZITHROMYCIN 250 MG PO TABS
500.0000 mg | ORAL_TABLET | Freq: Once | ORAL | Status: AC
  Administered 2023-04-20: 500 mg via ORAL
  Filled 2023-04-20: qty 2

## 2023-04-20 MED ORDER — GUAIFENESIN-CODEINE 100-10 MG/5ML PO SOLN
10.0000 mL | Freq: Once | ORAL | Status: AC
  Administered 2023-04-20: 10 mL via ORAL
  Filled 2023-04-20: qty 10

## 2023-04-20 NOTE — Discharge Instructions (Signed)
Begin taking Zithromax as prescribed.  Begin taking Robitussin with codeine as prescribed as needed for cough.  Primarily take this medication at night because it would make you drowsy.  Follow-up with primary doctor if not improving in the next week.

## 2023-04-20 NOTE — ED Provider Notes (Signed)
[place] EMERGENCY DEPARTMENT AT [facility] Provider Note   CSN: [id number] Arrival date & time: 04/19/23  2016     History  Chief Complaint  Patient presents with   Cough    Todd Owens is a 57 y.o. male.  Patient is a 57 year old male with history of hyperlipidemia, coronary artery disease presenting with complaints of cough.  This has been ongoing for the past 3 weeks.  He was started on doxycycline and prednisone 2 weeks ago and completed this course but has not helped.  He called the doctor's office today and was advised to come to the ER for a chest x-ray.  He denies any fevers or chills.  The history is provided by the patient.       Home Medications Prior to Admission medications   Medication Sig Start Date End Date Taking? Authorizing Provider  acetaminophen (TYLENOL) 500 MG tablet Take 500-1,000 mg by mouth every 6 (six) hours as needed (pain.).    [provider]  diclofenac Sodium (VOLTAREN) 1 % GEL APPLY 4 GRAMS TOPICALLY 4 TIMES DAILY**ASPIRIN & NSAIDS ORALLY NOT RECOMMENDED GIVEN CARDIAC ISSUES 12/08/21   Excell Seltzer, MD  doxycycline (VIBRA-TABS) 100 MG tablet Take 1 tablet (100 mg total) by mouth 2 (two) times daily. 04/08/23   Copland, Karleen Hampshire, MD  fluticasone (FLONASE) 50 MCG/ACT nasal spray Place 2 sprays into both nostrils daily as needed for allergies or rhinitis.    [provider]  furosemide (LASIX) 40 MG tablet TAKE 1 TABLET BY MOUTH EVERY DAY 10/02/22   Bedsole, Amy E, MD  ibuprofen (ADVIL) 200 MG tablet Take 200-400 mg by mouth every 8 (eight) hours as needed (pain.).    [provider]  losartan (COZAAR) 100 MG tablet TAKE 1 TABLET BY MOUTH EVERY DAY 08/23/22   Bedsole, Amy E, MD  nitroGLYCERIN (NITROSTAT) 0.4 MG SL tablet Place 0.4 mg under the tongue every 5 (five) minutes as needed for chest pain.    [provider]  NON FORMULARY Pt uses a cpap nightly    [provider]  omeprazole (PRILOSEC)  40 MG capsule TAKE 1 CAPSULE BY MOUTH EVERY DAY 03/30/23   Bedsole, Amy E, MD  Polyethyl Glycol-Propyl Glycol (LUBRICANT EYE DROPS) 0.4-0.3 % SOLN Place 1-2 drops into both eyes 3 (three) times daily as needed (dry/irritated eyes.).    [provider]  predniSONE (DELTASONE) 20 MG tablet Take 2 tablets (40 mg total) by mouth daily. 04/08/23   Copland, Karleen Hampshire, MD  rosuvastatin (CRESTOR) 40 MG tablet TAKE 1 TABLET BY MOUTH EVERY DAY 10/02/22   Excell Seltzer, MD      Allergies    Patient has no known allergies.    Review of Systems   Review of Systems  All other systems reviewed and are negative.   Physical Exam Updated Vital Signs BP 127/64   Pulse (!) 54   Temp 98.7 F (37.1 C) (Oral)   Resp 20   SpO2 95%  Physical Exam Vitals and nursing note reviewed.  Constitutional:      General: He is not in acute distress.    Appearance: He is well-developed. He is not diaphoretic.  HENT:     Head: Normocephalic and atraumatic.  Cardiovascular:     Rate and Rhythm: Normal rate and regular rhythm.     Heart sounds: No murmur heard.    No friction rub.  Pulmonary:     Effort: Pulmonary effort is normal. No respiratory distress.  Breath sounds: Normal breath sounds. No wheezing or rales.  Abdominal:     General: Bowel sounds are normal. There is no distension.     Palpations: Abdomen is soft.     Tenderness: There is no abdominal tenderness.  Musculoskeletal:        General: Normal range of motion.     Cervical back: Normal range of motion and neck supple.  Skin:    General: Skin is warm and dry.  Neurological:     Mental Status: He is alert and oriented to person, place, and time.     Coordination: Coordination normal.     ED Results / Procedures / Treatments   Labs (all labs ordered are listed, but only abnormal results are displayed) Labs Reviewed  CBC - Abnormal; Notable for the following components:      Result Value   RBC 4.15 (*)    All other components  within normal limits  BASIC METABOLIC PANEL - Abnormal; Notable for the following components:   Glucose, Bld 153 (*)    All other components within normal limits    EKG None  Radiology DG Chest 2 View  Result Date: 04/19/2023 CLINICAL DATA:  Cough and shortness of breath. Upper respiratory infection. Diagnosed with bronchitis a week ago and placed on antibiotics and prednisone. Not getting better. EXAM: CHEST - 2 VIEW COMPARISON:  12/13/2020 FINDINGS: The heart size and mediastinal contours are within normal limits. Both lungs are clear. The visualized skeletal structures are unremarkable. IMPRESSION: No active cardiopulmonary disease. Electronically Signed   By: Burman Nieves M.D.   On: 04/19/2023 22:21    Procedures Procedures    Medications Ordered in ED Medications  albuterol (VENTOLIN HFA) 108 (90 Base) MCG/ACT inhaler 2 puff (has no administration in time range)  azithromycin (ZITHROMAX) tablet 500 mg (has no administration in time range)  guaiFENesin-codeine 100-10 MG/5ML solution 10 mL (has no administration in time range)    ED Course/ Medical Decision Making/ A&P  Patient with a several week history of URI symptoms not better with doxycycline and prednisone.  He had negative COVID and flu swabs as an outpatient.  Patient arrives with stable vital signs and is afebrile.  There is no hypoxia.  CBC and basic metabolic panel basically normal.  Chest x-ray shows no acute process.  Symptoms potentially viral in nature, but as they have been prolonged and not improved with doxycycline, I will change his antibiotic to Zithromax and see if this helps.  He is also requested something for cough and will be given Robitussin with codeine.  Final Clinical Impression(s) / ED Diagnoses Final diagnoses:  None    Rx / DC Orders ED Discharge Orders     None         Geoffery Lyons, MD 04/20/23 [phone]

## 2023-06-03 ENCOUNTER — Encounter: Payer: Self-pay | Admitting: Gastroenterology

## 2023-06-05 ENCOUNTER — Other Ambulatory Visit: Payer: Self-pay | Admitting: Family Medicine

## 2023-06-25 ENCOUNTER — Other Ambulatory Visit: Payer: Self-pay | Admitting: Family Medicine

## 2023-06-25 ENCOUNTER — Telehealth: Payer: Self-pay | Admitting: *Deleted

## 2023-06-25 DIAGNOSIS — E785 Hyperlipidemia, unspecified: Secondary | ICD-10-CM

## 2023-06-25 DIAGNOSIS — Z125 Encounter for screening for malignant neoplasm of prostate: Secondary | ICD-10-CM

## 2023-06-25 DIAGNOSIS — R7303 Prediabetes: Secondary | ICD-10-CM

## 2023-06-25 NOTE — Telephone Encounter (Signed)
Please schedule CPE with fasting labs prior for Dr. Ermalene Searing after 07/04/23.

## 2023-06-25 NOTE — Telephone Encounter (Signed)
-----   Message from Alvina Chou sent at 06/25/2023  2:50 PM EST ----- Regarding: Lab orders for [facility], 12.11.24 Patient is scheduled for CPX labs, please order future labs, Thanks , Camelia Eng

## 2023-06-25 NOTE — Telephone Encounter (Signed)
Spoke to pt, scheduled cpe for 07/07/23

## 2023-06-30 ENCOUNTER — Other Ambulatory Visit (INDEPENDENT_AMBULATORY_CARE_PROVIDER_SITE_OTHER): Payer: BC Managed Care – PPO

## 2023-06-30 DIAGNOSIS — E785 Hyperlipidemia, unspecified: Secondary | ICD-10-CM | POA: Diagnosis not present

## 2023-06-30 DIAGNOSIS — Z125 Encounter for screening for malignant neoplasm of prostate: Secondary | ICD-10-CM

## 2023-06-30 DIAGNOSIS — R7303 Prediabetes: Secondary | ICD-10-CM

## 2023-06-30 LAB — COMPREHENSIVE METABOLIC PANEL
ALT: 16 U/L (ref 0–53)
AST: 17 U/L (ref 0–37)
Albumin: 3.8 g/dL (ref 3.5–5.2)
Alkaline Phosphatase: 59 U/L (ref 39–117)
BUN: 10 mg/dL (ref 6–23)
CO2: 28 meq/L (ref 19–32)
Calcium: 8.5 mg/dL (ref 8.4–10.5)
Chloride: 105 meq/L (ref 96–112)
Creatinine, Ser: 0.97 mg/dL (ref 0.40–1.50)
GFR: 86.78 mL/min (ref >60.00)
Glucose, Bld: 124 mg/dL — ABNORMAL HIGH (ref 70–99)
Potassium: 4 meq/L (ref 3.5–5.1)
Sodium: 142 meq/L (ref 135–145)
Total Bilirubin: 0.6 mg/dL (ref 0.2–1.2)
Total Protein: 6.2 g/dL (ref 6.0–8.3)

## 2023-06-30 LAB — PSA: PSA: 0.26 ng/mL (ref 0.10–4.00)

## 2023-06-30 LAB — HEMOGLOBIN A1C: Hgb A1c MFr Bld: 6.6 % — ABNORMAL HIGH (ref 4.6–6.5)

## 2023-06-30 LAB — LIPID PANEL
Cholesterol: 101 mg/dL (ref 0–200)
HDL: 29.8 mg/dL — ABNORMAL LOW (ref >39.00)
LDL Cholesterol: 55 mg/dL (ref 0–99)
NonHDL: 71.6
Total CHOL/HDL Ratio: 3
Triglycerides: 81 mg/dL (ref 0.0–149.0)
VLDL: 16.2 mg/dL (ref 0.0–40.0)

## 2023-06-30 NOTE — Progress Notes (Signed)
No critical labs need to be addressed urgently. We will discuss labs in detail at upcoming office visit.   

## 2023-07-07 ENCOUNTER — Ambulatory Visit: Payer: BC Managed Care – PPO | Admitting: Family Medicine

## 2023-07-07 VITALS — BP 128/74 | HR 67 | Temp 99.0°F | Ht 69.0 in | Wt 347.0 lb

## 2023-07-07 DIAGNOSIS — Z7985 Long-term (current) use of injectable non-insulin antidiabetic drugs: Secondary | ICD-10-CM

## 2023-07-07 DIAGNOSIS — Z0000 Encounter for general adult medical examination without abnormal findings: Secondary | ICD-10-CM

## 2023-07-07 DIAGNOSIS — I10 Essential (primary) hypertension: Secondary | ICD-10-CM | POA: Diagnosis not present

## 2023-07-07 DIAGNOSIS — E785 Hyperlipidemia, unspecified: Secondary | ICD-10-CM | POA: Diagnosis not present

## 2023-07-07 DIAGNOSIS — R7303 Prediabetes: Secondary | ICD-10-CM

## 2023-07-07 DIAGNOSIS — I5032 Chronic diastolic (congestive) heart failure: Secondary | ICD-10-CM

## 2023-07-07 DIAGNOSIS — E1159 Type 2 diabetes mellitus with other circulatory complications: Secondary | ICD-10-CM

## 2023-07-07 DIAGNOSIS — E221 Hyperprolactinemia: Secondary | ICD-10-CM

## 2023-07-07 MED ORDER — TIRZEPATIDE 2.5 MG/0.5ML ~~LOC~~ SOAJ: 2.5000 mg | SUBCUTANEOUS | 3 refills | Status: DC

## 2023-07-07 MED ORDER — TIRZEPATIDE-WEIGHT MANAGEMENT 2.5 MG/0.5ML ~~LOC~~ SOLN: 2.5000 mg | SUBCUTANEOUS | 3 refills | Status: DC

## 2023-07-07 NOTE — Assessment & Plan Note (Addendum)
Chronic, euvolemic in office today Reviewed last office visit from Dr. Okey Dupre January 23, 2022 Had side effects to Trulicity so changed to dapagliflozin to assist with management of heart failure. Had associated UTi.

## 2023-07-07 NOTE — Assessment & Plan Note (Addendum)
Encouraged exercise, weight loss, healthy eating habits.  Did not tolerate GLP1 Trulicity ( only report lateral side pain, not severe), but willing to try Mounjaro.

## 2023-07-07 NOTE — Assessment & Plan Note (Signed)
Stable, chronic.  Continue current medication.   Losartan and HCTZ

## 2023-07-07 NOTE — Assessment & Plan Note (Deleted)
Worsened control.  Did not tolerate trial of GLP1 RAD for CAD, prediabetes and weight management of morbid obesity. Encouraged exercise, weight loss, healthy eating habits.

## 2023-07-07 NOTE — Assessment & Plan Note (Signed)
LDL at goal less than 70 on rosuvastatin 40 mg daily with history of coronary artery disease.

## 2023-07-07 NOTE — Assessment & Plan Note (Signed)
Acute, new diagnosis Has been less active in the last 15 months, no recent steroid injections, worsening control of diet with being out of work. Encouraged heart healthy low carbohydrate diet. He is willing to try a different GLP-1 medication for diabetes control as well as cardiac prevention in setting of morbid obesity. We will try a trial of 2.5 mg weekly Mounjaro with plan to increase to 5 mg weekly after 2 to 4 weeks. He will follow-up for reevaluation in 3 months.

## 2023-07-07 NOTE — Assessment & Plan Note (Signed)
Prolactin now normalized.

## 2023-07-07 NOTE — Progress Notes (Signed)
Patient ID: Todd Owens, male    DOB: 14-Jul-1966, 57 y.o.   MRN: [id number]  This visit was conducted in person.  BP 128/74   Pulse 67   Temp 99 F (37.2 C) (Oral)   Ht 5\' 9"  (1.753 m)   Wt (!) 347 lb (157.4 kg)   SpO2 93%   BMI 51.24 kg/m    CC:  Chief Complaint  Patient presents with   Annual Exam    Subjective:   HPI: Todd Owens is a 57 y.o. male presenting on 07/07/2023 for Annual Exam  The patient presents for  complete physical and review of chronic health problems. He/She also has the following acute concerns today:  Left arm injury, carpal tunnel, ulnar neuropathy.  Out of work for 15 months.  Has scheduled MRI neck.  Has upcoming appt with Dr. Constance Goltz.   Hypertension:  At goal on losartan and Maxide BP Readings from Last 3 Encounters:  07/07/23 128/74  04/20/23 127/64  04/08/23 120/62  Using medication without problems or lightheadedness: none Chest pain with exertion: none Edema:  occ, stable Short of breath: none Average home BPs: Other issues: He has been less active with shoulder issues... some house work .  CAD  Chronic heart failure with preserved ejection fraction: Reviewed last office visit from Dr. Okey Dupre January 23, 2022 Had side effects to Trulicity so changed to dapagliflozin to assist with management of heart failure.  Stopped dapagliflozin given UTI.   Prediabetes and cholesterol: On rosuvastatin 40 mg daily with history of coronary artery disease.  Followed by cardiologist. Lab Results  Component Value Date   HGBA1C 6.6 (H) 06/30/2023   Lab Results  Component Value Date   CHOL 101 06/30/2023   HDL 29.80 (L) 06/30/2023   LDLCALC 55 06/30/2023   TRIG 81.0 06/30/2023   CHOLHDL 3 06/30/2023   Obstructive sleep apnea on CPAP  Left upper lobe pulmonary nodule noted on July 30, 2020.Marland Kitchen  Repeat chest CT showed no evidence of pulmonary nodule.  The region of concern on the prior heart CT scan was definitively identified as pulmonary  arterial branch.  Morbid obesity: Did not tolerate GLP1 Trulicity. Wt Readings from Last 3 Encounters:  07/07/23 (!) 347 lb (157.4 kg)  04/08/23 (!) 348 lb (157.9 kg)  02/08/23 (!) 350 lb 8 oz (159 kg)   Prediabetes... now worsened and borderline  diabetes   Lab Results  Component Value Date   HGBA1C 6.6 (H) 06/30/2023      Relevant past medical, surgical, family and social history reviewed and updated as indicated. Interim medical history since our last visit reviewed. Allergies and medications reviewed and updated. Outpatient Medications Prior to Visit  Medication Sig Dispense Refill   furosemide (LASIX) 40 MG tablet TAKE 1 TABLET BY MOUTH EVERY DAY 90 tablet 0   losartan (COZAAR) 100 MG tablet TAKE 1 TABLET BY MOUTH EVERY DAY 90 tablet 3   nitroGLYCERIN (NITROSTAT) 0.4 MG SL tablet Place 0.4 mg under the tongue every 5 (five) minutes as needed for chest pain.     rosuvastatin (CRESTOR) 40 MG tablet TAKE 1 TABLET BY MOUTH EVERY DAY 90 tablet 0   acetaminophen (TYLENOL) 500 MG tablet Take 500-1,000 mg by mouth every 6 (six) hours as needed (pain.).     azithromycin (ZITHROMAX) 250 MG tablet Take 1 tablet (250 mg total) by mouth daily. 4 tablet 0   diclofenac Sodium (VOLTAREN) 1 % GEL APPLY 4 GRAMS TOPICALLY 4 TIMES DAILY**ASPIRIN &  NSAIDS ORALLY NOT RECOMMENDED GIVEN CARDIAC ISSUES 100 g 2   doxycycline (VIBRA-TABS) 100 MG tablet Take 1 tablet (100 mg total) by mouth 2 (two) times daily. 20 tablet 0   fluticasone (FLONASE) 50 MCG/ACT nasal spray Place 2 sprays into both nostrils daily as needed for allergies or rhinitis.     guaiFENesin-codeine 100-10 MG/5ML syrup Take 10 mLs by mouth every 6 (six) hours as needed for cough. 120 mL 0   ibuprofen (ADVIL) 200 MG tablet Take 200-400 mg by mouth every 8 (eight) hours as needed (pain.).     NON FORMULARY Pt uses a cpap nightly     omeprazole (PRILOSEC) 40 MG capsule TAKE 1 CAPSULE BY MOUTH EVERY DAY 90 capsule 0   Polyethyl  Glycol-Propyl Glycol (LUBRICANT EYE DROPS) 0.4-0.3 % SOLN Place 1-2 drops into both eyes 3 (three) times daily as needed (dry/irritated eyes.).     predniSONE (DELTASONE) 20 MG tablet Take 2 tablets (40 mg total) by mouth daily. 14 tablet 0   No facility-administered medications prior to visit.     Per HPI unless specifically indicated in ROS section below Review of Systems  Constitutional:  Negative for fatigue and fever.  HENT:  Negative for ear pain.   Eyes:  Negative for pain.  Respiratory:  Negative for cough and shortness of breath.   Cardiovascular:  Negative for chest pain, palpitations and leg swelling.  Gastrointestinal:  Negative for abdominal pain.  Genitourinary:  Negative for dysuria.  Musculoskeletal:  Negative for arthralgias.  Neurological:  Negative for syncope, light-headedness and headaches.  Psychiatric/Behavioral:  Negative for dysphoric mood.    Objective:  BP 128/74   Pulse 67   Temp 99 F (37.2 C) (Oral)   Ht 5\' 9"  (1.753 m)   Wt (!) 347 lb (157.4 kg)   SpO2 93%   BMI 51.24 kg/m   Wt Readings from Last 3 Encounters:  07/07/23 (!) 347 lb (157.4 kg)  04/08/23 (!) 348 lb (157.9 kg)  02/08/23 (!) 350 lb 8 oz (159 kg)      Physical Exam Constitutional:      General: He is not in acute distress.    Appearance: Normal appearance. He is well-developed. He is not ill-appearing or toxic-appearing.  HENT:     Head: Normocephalic and atraumatic.     Right Ear: Hearing, tympanic membrane, ear canal and external ear normal.     Left Ear: Hearing, tympanic membrane, ear canal and external ear normal.     Nose: Nose normal.     Mouth/Throat:     Pharynx: Uvula midline.  Eyes:     General: Lids are normal. Lids are everted, no foreign bodies appreciated.     Conjunctiva/sclera: Conjunctivae normal.     Pupils: Pupils are equal, round, and reactive to light.  Neck:     Thyroid: No thyroid mass or thyromegaly.     Vascular: No carotid bruit.     Trachea:  Trachea and phonation normal.  Cardiovascular:     Rate and Rhythm: Normal rate and regular rhythm.     Pulses: Normal pulses.     Heart sounds: S1 normal and S2 normal. No murmur heard.    No gallop.  Pulmonary:     Breath sounds: Normal breath sounds. No wheezing, rhonchi or rales.  Abdominal:     General: Bowel sounds are normal.     Palpations: Abdomen is soft.     Tenderness: There is no abdominal tenderness. There is no guarding  or rebound.     Hernia: No hernia is present.  Musculoskeletal:     Cervical back: Normal range of motion and neck supple.  Lymphadenopathy:     Cervical: No cervical adenopathy.  Skin:    General: Skin is warm and dry.     Findings: No rash.  Neurological:     Mental Status: He is alert.     Cranial Nerves: No cranial nerve deficit.     Sensory: No sensory deficit.     Gait: Gait normal.     Deep Tendon Reflexes: Reflexes are normal and symmetric.  Psychiatric:        Speech: Speech normal.        Behavior: Behavior normal.        Judgment: Judgment normal.       Results for orders placed or performed in visit on 06/30/23  PSA   Collection Time: 06/30/23  8:38 AM  Result Value Ref Range   PSA 0.26 0.10 - 4.00 ng/mL  Lipid panel   Collection Time: 06/30/23  8:38 AM  Result Value Ref Range   Cholesterol 101 0 - 200 mg/dL   Triglycerides 91.4 0.0 - 149.0 mg/dL   HDL 78.29 (L) >56.21 mg/dL   VLDL 30.8 0.0 - 65.7 mg/dL   LDL Cholesterol 55 0 - 99 mg/dL   Total CHOL/HDL Ratio 3    NonHDL 71.60   Hemoglobin A1c   Collection Time: 06/30/23  8:38 AM  Result Value Ref Range   Hgb A1c MFr Bld 6.6 (H) 4.6 - 6.5 %  Comprehensive metabolic panel   Collection Time: 06/30/23  8:38 AM  Result Value Ref Range   Sodium 142 135 - 145 mEq/L   Potassium 4.0 3.5 - 5.1 mEq/L   Chloride 105 96 - 112 mEq/L   CO2 28 19 - 32 mEq/L   Glucose, Bld 124 (H) 70 - 99 mg/dL   BUN 10 6 - 23 mg/dL   Creatinine, Ser 8.46 0.40 - 1.50 mg/dL   Total Bilirubin 0.6  0.2 - 1.2 mg/dL   Alkaline Phosphatase 59 39 - 117 U/L   AST 17 0 - 37 U/L   ALT 16 0 - 53 U/L   Total Protein 6.2 6.0 - 8.3 g/dL   Albumin 3.8 3.5 - 5.2 g/dL   GFR 96.29 >52.84 mL/min   Calcium 8.5 8.4 - 10.5 mg/dL     COVID 19 screen:  No recent travel or known exposure to COVID19 The patient denies respiratory symptoms of COVID 19 at this time. The importance of social distancing was discussed today.   Assessment and Plan The patient's preventative maintenance and recommended screening tests for an annual wellness exam were reviewed in full today. Brought up to date unless services declined.  Counselled on the importance of diet, exercise, and its role in overall health and mortality. The patient's FH and SH was reviewed, including their home life, tobacco status, and drug and alcohol status.   Vaccine: Td 2022.  Discussed COVID19 vaccine side effects and benefits. Strongly encouraged the patient to get the vaccine. Questions answered. Consider shingrix. Refused flu shot. Former smoker, Quit 15-20 years ago Prostate,  Lab Results  Component Value Date   PSA 0.26 06/30/2023   PSA 0.33 07/03/2022   PSA 0.25 01/31/2021  Colon cancer screen:  Tubular adenoma 02/18/2018.. plan repeat in 5  years, Dr. Lavon Paganini. DUE, but he is not interested at this time.  No STD testing requested. XLK:[id number]  Hep  C:not indicated.    Routine general medical examination at a health care facility  Essential hypertension Assessment & Plan: Stable, chronic.  Continue current medication.   Losartan and HCTZ   Hyperlipidemia LDL goal <70 Assessment & Plan: LDL at goal less than 70 on rosuvastatin 40 mg daily with history of coronary artery disease.     Morbid obesity (HCC) Assessment & Plan: Encouraged exercise, weight loss, healthy eating habits.  Did not tolerate GLP1 Trulicity ( only report lateral side pain, not severe), but willing to try Mounjaro.   Chronic heart failure with  preserved ejection fraction (HFpEF) (HCC) Assessment & Plan: Chronic, euvolemic in office today Reviewed last office visit from Dr. Okey Dupre January 23, 2022 Had side effects to Trulicity so changed to dapagliflozin to assist with management of heart failure. Had associated UTi.   Hyperprolactinemia (HCC) Assessment & Plan: Prolactin now normalized.   Controlled type 2 diabetes mellitus with other circulatory complication, unspecified whether long term insulin use (HCC) Assessment & Plan: Acute, new diagnosis Has been less active in the last 15 months, no recent steroid injections, worsening control of diet with being out of work. Encouraged heart healthy low carbohydrate diet. He is willing to try a different GLP-1 medication for diabetes control as well as cardiac prevention in setting of morbid obesity. We will try a trial of 2.5 mg weekly Mounjaro with plan to increase to 5 mg weekly after 2 to 4 weeks. He will follow-up for reevaluation in 3 months.   Prediabetes  Other orders -     Tirzepatide; Inject 2.5 mg into the skin once a week. Plan increase to 5 mg weekly if tolerating after 2-4 weeks  Dispense: 2 mL; Refill: 3      Kerby Nora, MD

## 2023-07-29 ENCOUNTER — Telehealth: Payer: Self-pay

## 2023-07-29 ENCOUNTER — Other Ambulatory Visit (HOSPITAL_COMMUNITY): Payer: Self-pay

## 2023-07-29 NOTE — Telephone Encounter (Signed)
 Pharmacy Patient Advocate Encounter   Received notification from CoverMyMeds that prior authorization for Mounjaro  2.5MG /0.5ML auto-injectors is required/requested.   Insurance verification completed.   The patient is insured through [facility] .   Per test claim: PA required; PA submitted to above mentioned insurance via CoverMyMeds Key/confirmation #/EOC [facility] Status is pending

## 2023-07-30 ENCOUNTER — Other Ambulatory Visit (HOSPITAL_COMMUNITY): Payer: Self-pay

## 2023-07-30 NOTE — Telephone Encounter (Signed)
 Pharmacy Patient Advocate Encounter  Received notification from [facility] that Prior Authorization for Mounjaro  2.5mg  pens has been APPROVED from 07/29/23 to 07/27/26. Ran test claim, Copay is $30.00. This test claim was processed through [facility]- copay amounts may vary at other pharmacies due to pharmacy/plan contracts, or as the patient moves through the different stages of their insurance plan.   PA #/Case ID/Reference #: [id number]

## 2023-08-12 ENCOUNTER — Encounter: Payer: Self-pay | Admitting: Family Medicine

## 2023-08-12 ENCOUNTER — Ambulatory Visit: Payer: 59 | Admitting: Family Medicine

## 2023-08-12 VITALS — BP 134/70 | HR 80 | Temp 99.4°F | Ht 69.0 in | Wt 349.2 lb

## 2023-08-12 DIAGNOSIS — R051 Acute cough: Secondary | ICD-10-CM

## 2023-08-12 DIAGNOSIS — J101 Influenza due to other identified influenza virus with other respiratory manifestations: Secondary | ICD-10-CM

## 2023-08-12 LAB — POCT INFLUENZA A/B
Influenza A, POC: POSITIVE — AB
Influenza B, POC: NEGATIVE

## 2023-08-12 LAB — POC COVID19 BINAXNOW: SARS Coronavirus 2 Ag: NEGATIVE

## 2023-08-12 LAB — POCT RAPID STREP A (OFFICE): Rapid Strep A Screen: NEGATIVE

## 2023-08-12 MED ORDER — GUAIFENESIN-CODEINE 100-10 MG/5ML PO SOLN: 5.0000 mL | Freq: Every day | ORAL | 0 refills | Status: DC

## 2023-08-12 MED ORDER — OSELTAMIVIR PHOSPHATE 75 MG PO CAPS: 75.0000 mg | ORAL_CAPSULE | Freq: Two times a day (BID) | ORAL | 0 refills | Status: DC

## 2023-08-12 MED ORDER — PREDNISONE 20 MG PO TABS: ORAL_TABLET | ORAL | 0 refills | Status: DC

## 2023-08-12 NOTE — Progress Notes (Signed)
Patient ID: Todd Owens, male    DOB: 1965/12/08, 58 y.o.   MRN: [id number]  This visit was conducted in person.  BP 134/70   Pulse 80   Temp 99.4 F (37.4 C) (Oral)   Ht 5\' 9"  (1.753 m)   Wt (!) 349 lb 4 oz (158.4 kg)   SpO2 95%   BMI 51.58 kg/m    CC:  Chief Complaint  Patient presents with   Generalized Body Aches    C/o body aches, HA, chills, fatigue, cough and SOB. Sxs started 08/11/23.    Subjective:   HPI: Todd Owens is a 58 y.o. male presenting on 08/12/2023 for Generalized Body Aches (C/o body aches, HA, chills, fatigue, cough and SOB. Sxs started 08/11/23.)    Date of onset:  24 hours of symptoms Initial symptoms included  cough Symptoms progressed to fatigue, dry cough, chest burning. Feeling achy, hit by a mack truck.  Headache, no face pain, no ear pain.  No new SOB, no wheeze. Has low grade fever in office.     Sick contacts:  none COVID testing:   none   No history of chronic lung disease such as asthma or COPD. Non-smoker.       Relevant past medical, surgical, family and social history reviewed and updated as indicated. Interim medical history since our last visit reviewed. Allergies and medications reviewed and updated. Outpatient Medications Prior to Visit  Medication Sig Dispense Refill   furosemide (LASIX) 40 MG tablet TAKE 1 TABLET BY MOUTH EVERY DAY 90 tablet 0   losartan (COZAAR) 100 MG tablet TAKE 1 TABLET BY MOUTH EVERY DAY 90 tablet 3   nitroGLYCERIN (NITROSTAT) 0.4 MG SL tablet Place 0.4 mg under the tongue every 5 (five) minutes as needed for chest pain.     rosuvastatin (CRESTOR) 40 MG tablet TAKE 1 TABLET BY MOUTH EVERY DAY 90 tablet 0   tirzepatide (MOUNJARO) 2.5 MG/0.5ML Pen Inject 2.5 mg into the skin once a week. Plan increase to 5 mg weekly if tolerating after 2-4 weeks 2 mL 3   No facility-administered medications prior to visit.     Per HPI unless specifically indicated in ROS section below Review of Systems   Constitutional:  Negative for fatigue and fever.  HENT:  Negative for ear pain.   Eyes:  Negative for pain.  Respiratory:  Negative for cough and shortness of breath.   Cardiovascular:  Negative for chest pain, palpitations and leg swelling.  Gastrointestinal:  Negative for abdominal pain.  Genitourinary:  Negative for dysuria.  Musculoskeletal:  Negative for arthralgias.  Neurological:  Negative for syncope, light-headedness and headaches.  Psychiatric/Behavioral:  Negative for dysphoric mood.    Objective:  BP 134/70   Pulse 80   Temp 99.4 F (37.4 C) (Oral)   Ht 5\' 9"  (1.753 m)   Wt (!) 349 lb 4 oz (158.4 kg)   SpO2 95%   BMI 51.58 kg/m   Wt Readings from Last 3 Encounters:  08/12/23 (!) 349 lb 4 oz (158.4 kg)  07/07/23 (!) 347 lb (157.4 kg)  04/08/23 (!) 348 lb (157.9 kg)      Physical Exam Constitutional:      General: He is not in acute distress.    Appearance: Normal appearance. He is well-developed and overweight. He is ill-appearing. He is not toxic-appearing.  HENT:     Head: Normocephalic and atraumatic.     Right Ear: Hearing, tympanic membrane, ear canal and external  ear normal. No tenderness. No foreign body. Tympanic membrane is not retracted or bulging.     Left Ear: Hearing, tympanic membrane, ear canal and external ear normal. No tenderness. No foreign body. Tympanic membrane is not retracted or bulging.     Nose: Nose normal. No mucosal edema or rhinorrhea.     Right Sinus: No maxillary sinus tenderness or frontal sinus tenderness.     Left Sinus: No maxillary sinus tenderness or frontal sinus tenderness.     Mouth/Throat:     Dentition: Normal dentition. No dental caries.     Pharynx: Uvula midline. No oropharyngeal exudate.     Tonsils: No tonsillar abscesses.  Eyes:     General: Lids are normal. Lids are everted, no foreign bodies appreciated.     Conjunctiva/sclera:     Right eye: Right conjunctiva is injected.     Left eye: Left conjunctiva is  injected.     Pupils: Pupils are equal, round, and reactive to light.  Neck:     Thyroid: No thyroid mass or thyromegaly.     Vascular: No carotid bruit.     Trachea: Trachea and phonation normal.  Cardiovascular:     Rate and Rhythm: Normal rate and regular rhythm.     Pulses: Normal pulses.     Heart sounds: Normal heart sounds, S1 normal and S2 normal. No murmur heard.    No gallop.  Pulmonary:     Effort: Pulmonary effort is normal. No respiratory distress.     Breath sounds: Normal breath sounds. No wheezing, rhonchi or rales.  Abdominal:     General: Bowel sounds are normal.     Palpations: Abdomen is soft.     Tenderness: There is no abdominal tenderness. There is no guarding or rebound.     Hernia: No hernia is present.  Musculoskeletal:     Cervical back: Normal range of motion and neck supple.  Skin:    General: Skin is warm and dry.     Findings: No rash.  Neurological:     Mental Status: He is alert.     Deep Tendon Reflexes: Reflexes are normal and symmetric.  Psychiatric:        Speech: Speech normal.        Behavior: Behavior normal.        Judgment: Judgment normal.       Results for orders placed or performed in visit on 08/12/23  POCT rapid strep A   Collection Time: 08/12/23  4:31 PM  Result Value Ref Range   Rapid Strep A Screen Negative Negative  POC COVID-19 BinaxNow   Collection Time: 08/12/23  4:32 PM  Result Value Ref Range   SARS Coronavirus 2 Ag Negative Negative  POCT Influenza A/B   Collection Time: 08/12/23  4:32 PM  Result Value Ref Range   Influenza A, POC Positive (A) Negative   Influenza B, POC Negative Negative    Assessment and Plan  Acute cough -     POC COVID-19 BinaxNow -     POCT Influenza A/B -     POCT rapid strep A  Influenza A Assessment & Plan: Discussed symptomatic care.  Hydration, rest. Call if SOB, cough worsening or prolongued fever. Reviewed flu timeline. He is at higher risk for complications , so we will  treat with  tamiflu.  Given SOB and wheeze.. will treat with prednisone taper. Discussed family prophylaxis. He was advised to not return to work until 24 hour after fever  resolved on no antipyretics.     Other orders -     predniSONE; 3 tabs by mouth daily x 3 days, then 2 tabs by mouth daily x 2 days then 1 tab by mouth daily x 2 days  Dispense: 15 tablet; Refill: 0 -     Oseltamivir Phosphate; Take 1 capsule (75 mg total) by mouth 2 (two) times daily.  Dispense: 10 capsule; Refill: 0 -     guaiFENesin-Codeine; Take 5-10 mLs by mouth at bedtime.  Dispense: 100 mL; Refill: 0    No follow-ups on file.   Kerby Nora, MD

## 2023-08-13 ENCOUNTER — Telehealth: Payer: Self-pay | Admitting: *Deleted

## 2023-08-13 DIAGNOSIS — J101 Influenza due to other identified influenza virus with other respiratory manifestations: Secondary | ICD-10-CM | POA: Insufficient documentation

## 2023-08-13 NOTE — Assessment & Plan Note (Signed)
Discussed symptomatic care.  Hydration, rest. Call if SOB, cough worsening or prolongued fever. Reviewed flu timeline. He is at higher risk for complications , so we will treat with  tamiflu.  Given SOB and wheeze.. will treat with prednisone taper. Discussed family prophylaxis. He was advised to not return to work until 24 hour after fever resolved on no antipyretics.

## 2023-08-13 NOTE — Telephone Encounter (Signed)
-----   Message from Kerby Nora sent at 08/13/2023  9:34 AM EST ----- Please let patient know that it appears after he left yesterday his flu test had returned positive as entered by the CMA.    Go ahead and start the Tamiflu that I sent in yesterday for treatment of flu

## 2023-08-13 NOTE — Telephone Encounter (Addendum)
Mr. Kathan notified as instructed by telephone.  He ask if a Rx for Tamiflu also be sent in to [place].  [facility] [address]  Rx sent as requested per Dr. Ermalene Searing.

## 2023-09-06 ENCOUNTER — Other Ambulatory Visit: Payer: Self-pay | Admitting: Family Medicine

## 2023-09-13 ENCOUNTER — Ambulatory Visit: Payer: 59 | Attending: Internal Medicine | Admitting: Internal Medicine

## 2023-09-13 NOTE — Progress Notes (Deleted)
  Cardiology Office Note:  .   Date:  09/13/2023  ID:  Todd Owens, DOB [birth year], MRN [id number] PCP: Excell Seltzer, MD  [facility] Providers Cardiologist:  Yvonne Kendall, MD { Click to update primary MD,subspecialty MD or APP then REFRESH:1}    History of Present Illness: Todd Owens   Todd Owens is a 58 y.o. male with history of moderate multivessel coronary artery disease (not hemodynamically significant by RFR), chronic HFpEF, syncope, hypertension, hyperlipidemia, obstructive sleep apnea, and morbid obesity, who presents for follow-up of CAD and HFpEF.  I last saw him in 01/2022, which time he reported mild exertional dyspnea when outside in the heat and humidity.  He denied chest pain.  We did not make any medication changes or pursue additional testing.  Smokeless tobacco cessation was encouraged.  ROS: See HPI  Studies Reviewed: .        Cath/PCI: LHC (8/5/2.22): LMCA normal.  60% proximal and 30% mid LAD stenoses (RFR 0.91), 50% ostial ramus intermedius lesion (RFR 1.0), normal LCx, and dominant RCA with minimal luminal irregularities.  LVEF 55-65% with mildly to moderately elevated filling pressure (LVEDP 20-25 mmHg).     Non-Invasive Evaluation(s): Coronary CTA (01/09/2021): Poor quality study due to attenuation (obesity).  CAC 1155 (99th percentile).  Moderate to severe multivessel CAD of up to 50-69%.  CT FFR 0.72 in distal LAD and 0.77 in distal LCx. TTE (09/20/2019): Technically difficult study with LVEF of 55-60%.  Normal RV size and function.  Normal PA pressure.  No significant valvular abnormality. Exercise tolerance test (08/10/16): Fair exercise capacity (6 minutes, 48 seconds) achieving 155 bpm (91% MPHR). Hypertensive response to exercise. No significant ST-T segment or T-wave changes. Low risk study. Transthoracic echocardiogram (07/25/16): Normal LV size with moderate LVH. LVEF 60-65%. Normal diastolic function. Mildly dilated aortic root. Mild left atrial  enlargement. Normal RV size and function. No significant valvular abnormalities. Pharmacologic myocardial perfusion stress test (04/28/11): Normal study without ischemia or scar. LVEF 54%.  Risk Assessment/Calculations:   {Does this patient have ATRIAL FIBRILLATION?[phone]} No BP recorded.  {Refresh Note OR Click here to enter BP  :1}***       Physical Exam:   VS:  There were no vitals taken for this visit.   Wt Readings from Last 3 Encounters:  08/12/23 (!) 349 lb 4 oz (158.4 kg)  07/07/23 (!) 347 lb (157.4 kg)  04/08/23 (!) 348 lb (157.9 kg)    General:  NAD. Neck: No JVD or HJR. Lungs: Clear to auscultation bilaterally without wheezes or crackles. Heart: Regular rate and rhythm without murmurs, rubs, or gallops. Abdomen: Soft, nontender, nondistended. Extremities: No lower extremity edema.  ASSESSMENT AND PLAN: .    ***    {Are you ordering a CV Procedure (e.g. stress test, cath, DCCV, TEE, etc)?   Press F2        :[id number]}  Dispo: ***  Signed, Yvonne Kendall, MD

## 2023-09-30 ENCOUNTER — Other Ambulatory Visit: Payer: Self-pay | Admitting: Family Medicine

## 2023-10-05 ENCOUNTER — Ambulatory Visit: Payer: BC Managed Care – PPO | Admitting: Family Medicine

## 2023-11-10 ENCOUNTER — Other Ambulatory Visit: Payer: Self-pay | Admitting: Family Medicine

## 2023-11-10 NOTE — Telephone Encounter (Signed)
 Called  pt and schedule a appt for f/u

## 2023-11-10 NOTE — Telephone Encounter (Signed)
 Please schedule office visit to follow up on diabetes with Dr. Cherlyn Cornet. He was suppose to follow up (around 10/05/2023) for diabetes follow up with POC A1C, urine micro.

## 2023-11-16 ENCOUNTER — Ambulatory Visit: Admitting: Family Medicine

## 2023-11-23 ENCOUNTER — Encounter: Payer: Self-pay | Admitting: Family Medicine

## 2023-11-23 ENCOUNTER — Ambulatory Visit (INDEPENDENT_AMBULATORY_CARE_PROVIDER_SITE_OTHER): Admitting: Family Medicine

## 2023-11-23 VITALS — BP 130/70 | HR 55 | Temp 97.3°F | Ht 69.0 in | Wt 341.4 lb

## 2023-11-23 DIAGNOSIS — Z7985 Long-term (current) use of injectable non-insulin antidiabetic drugs: Secondary | ICD-10-CM | POA: Diagnosis not present

## 2023-11-23 DIAGNOSIS — J209 Acute bronchitis, unspecified: Secondary | ICD-10-CM

## 2023-11-23 DIAGNOSIS — E1159 Type 2 diabetes mellitus with other circulatory complications: Secondary | ICD-10-CM

## 2023-11-23 LAB — MICROALBUMIN / CREATININE URINE RATIO
Creatinine,U: 227.5 mg/dL
Microalb Creat Ratio: UNDETERMINED mg/g (ref 0.0–30.0)
Microalb, Ur: <0.7 mg/dL

## 2023-11-23 LAB — POCT GLYCOSYLATED HEMOGLOBIN (HGB A1C): Hemoglobin A1C: 6.3 % — AB (ref 4.0–5.6)

## 2023-11-23 LAB — HM DIABETES FOOT EXAM

## 2023-11-23 MED ORDER — AZITHROMYCIN 250 MG PO TABS: ORAL_TABLET | ORAL | 0 refills | Status: DC

## 2023-11-23 MED ORDER — PREDNISONE 10 MG PO TABS: ORAL_TABLET | ORAL | 0 refills | Status: DC

## 2023-11-23 MED ORDER — ALBUTEROL SULFATE HFA 108 (90 BASE) MCG/ACT IN AERS: 2.0000 | INHALATION_SPRAY | Freq: Four times a day (QID) | RESPIRATORY_TRACT | 2 refills | Status: AC | PRN

## 2023-11-23 MED ORDER — TIRZEPATIDE 2.5 MG/0.5ML ~~LOC~~ SOAJ: 2.5000 mg | SUBCUTANEOUS | 3 refills | Status: DC

## 2023-11-23 NOTE — Assessment & Plan Note (Signed)
 Acute, past timeframe for testing for COVID given no change in therapy. Patient with significant wheezing.  He states ever since COVID when he gets 6 sick he has wheezing.  He had similar illness in the fall that resolved. Given going on greater than 7 to 10 days will cover for bacterial superinfection with azithromycin . Will treat with prednisone  taper.  Can use albuterol  as needed.  Return and ER precautions provided.  He will go to the emergency room if he has severe shortness of breath.

## 2023-11-23 NOTE — Assessment & Plan Note (Addendum)
 Acute,  good control, diet control. He has been more active lately and is watching his eating habits some. He had no side effects to Mounjaro 2.5 mg except for burping.  He is willing to restart this and stopped as a mistake from the pharmacy.  He will let me know in 1 month when he is ready to increase the dose.  Improving with lifestyle changes. Mounjaro 2.5 mg weekly

## 2023-11-23 NOTE — Assessment & Plan Note (Signed)
 With comorbidities.  Recommend restarting GLP-1 medication.  Follow-up in 6 months at physical.  He will call with update in 1 month.

## 2023-11-23 NOTE — Progress Notes (Signed)
 Patient ID: Todd Owens, male    DOB: 09-23-1965, 58 y.o.   MRN: [id number]  This visit was conducted in person.  BP 130/70   Pulse (!) 55   Temp (!) 97.3 F (36.3 C) (Temporal)   Ht 5\' 9"  (1.753 m)   Wt (!) 341 lb 6 oz (154.8 kg)   SpO2 95%   BMI 50.41 kg/m    CC:  Chief Complaint  Patient presents with   Nasal Congestion    X 1 week   Cough   Shortness of Breath        Headache   Diabetes    Subjective:   HPI: Todd Owens is a 58 y.o. male presenting on 11/23/2023 for Nasal Congestion (X 1 week), Cough, Shortness of Breath (/), Headache, and Diabetes    Date of onset:  1 week Initial symptoms included  dry scratchy throat, headaches. Symptoms progressed to  productive cough, nasal congestion.. now in chest congestion.  No ear  pain.  Feeling short of breath.  No fever.    Sick contacts: none COVID testing:   none     He has tried to treat with  cough drops, Flonase      No history of chronic lung disease such as asthma or COPD. Non-smoker.   Diabetes: No longer taking Mounjaro.. took for 1 month but then pharmacy did not call No SE except burping.  More active this time of year. Lab Results  Component Value Date   HGBA1C 6.3 (A) 11/23/2023  Using medications without difficulties: Hypoglycemic episodes:? Hyperglycemic episodes:? Feet problems: none Blood Sugars averaging:not cheking eye exam within last year: due   Wt Readings from Last 3 Encounters:  11/23/23 (!) 341 lb 6 oz (154.8 kg)  08/12/23 (!) 349 lb 4 oz (158.4 kg)  07/07/23 (!) 347 lb (157.4 kg)        Relevant past medical, surgical, family and social history reviewed and updated as indicated. Interim medical history since our last visit reviewed. Allergies and medications reviewed and updated. Outpatient Medications Prior to Visit  Medication Sig Dispense Refill   furosemide  (LASIX ) 40 MG tablet TAKE 1 TABLET BY MOUTH EVERY DAY 90 tablet 3   losartan  (COZAAR ) 100 MG tablet  TAKE 1 TABLET BY MOUTH EVERY DAY 90 tablet 3   nitroGLYCERIN  (NITROSTAT ) 0.4 MG SL tablet Place 0.4 mg under the tongue every 5 (five) minutes as needed for chest pain.     omeprazole  (PRILOSEC) 40 MG capsule TAKE 1 CAPSULE BY MOUTH EVERY DAY 90 capsule 0   rosuvastatin  (CRESTOR ) 40 MG tablet TAKE 1 TABLET BY MOUTH EVERY DAY 90 tablet 3   guaiFENesin -codeine  100-10 MG/5ML syrup Take 5-10 mLs by mouth at bedtime. 100 mL 0   oseltamivir  (TAMIFLU ) 75 MG capsule Take 1 capsule (75 mg total) by mouth 2 (two) times daily. 10 capsule 0   predniSONE  (DELTASONE ) 20 MG tablet 3 tabs by mouth daily x 3 days, then 2 tabs by mouth daily x 2 days then 1 tab by mouth daily x 2 days 15 tablet 0   tirzepatide (MOUNJARO) 2.5 MG/0.5ML Pen Inject 2.5 mg into the skin once a week. Plan increase to 5 mg weekly if tolerating after 2-4 weeks (Patient not taking: Reported on 11/23/2023) 2 mL 3   No facility-administered medications prior to visit.     Per HPI unless specifically indicated in ROS section below Review of Systems  Constitutional:  Positive for fatigue. Negative for fever.  HENT:  Positive for sinus pressure and sore throat. Negative for ear pain.   Eyes:  Negative for pain.  Respiratory:  Positive for cough, shortness of breath and wheezing.   Cardiovascular:  Negative for chest pain, palpitations and leg swelling.  Gastrointestinal:  Negative for abdominal pain.  Genitourinary:  Negative for dysuria.  Musculoskeletal:  Negative for arthralgias.  Neurological:  Negative for syncope, light-headedness and headaches.  Psychiatric/Behavioral:  Negative for dysphoric mood.    Objective:  BP 130/70   Pulse (!) 55   Temp (!) 97.3 F (36.3 C) (Temporal)   Ht 5\' 9"  (1.753 m)   Wt (!) 341 lb 6 oz (154.8 kg)   SpO2 95%   BMI 50.41 kg/m   Wt Readings from Last 3 Encounters:  11/23/23 (!) 341 lb 6 oz (154.8 kg)  08/12/23 (!) 349 lb 4 oz (158.4 kg)  07/07/23 (!) 347 lb (157.4 kg)      Physical  Exam Constitutional:      Appearance: He is well-developed.  HENT:     Head: Normocephalic.     Right Ear: Hearing normal.     Left Ear: Hearing normal.     Nose: Nose normal.  Neck:     Thyroid : No thyroid  mass or thyromegaly.     Vascular: No carotid bruit.     Trachea: Trachea normal.  Cardiovascular:     Rate and Rhythm: Normal rate and regular rhythm.     Pulses: Normal pulses.     Heart sounds: Heart sounds not distant. No murmur heard.    No friction rub. No gallop.     Comments: No peripheral edema Pulmonary:     Effort: Pulmonary effort is normal. No respiratory distress.     Breath sounds: Wheezing present.  Skin:    General: Skin is warm and dry.     Findings: No rash.  Psychiatric:        Speech: Speech normal.        Behavior: Behavior normal.        Thought Content: Thought content normal.   Diabetic foot exam: Normal inspection No skin breakdown Great toe calluses  Normal DP pulses Normal sensation to light touch and monofilament Nails  thickened     Results for orders placed or performed in visit on 11/23/23  HM DIABETES FOOT EXAM   Collection Time: 11/23/23 12:00 AM  Result Value Ref Range   HM Diabetic Foot Exam done   POCT glycosylated hemoglobin (Hb A1C)   Collection Time: 11/23/23  8:33 AM  Result Value Ref Range   Hemoglobin A1C 6.3 (A) 4.0 - 5.6 %   HbA1c POC (<> result, manual entry)     HbA1c, POC (prediabetic range)     HbA1c, POC (controlled diabetic range)      Assessment and Plan  Controlled type 2 diabetes mellitus with other circulatory complication, without long-term current use of insulin (HCC) Assessment & Plan: Acute,  good control, diet control. He has been more active lately and is watching his eating habits some. He had no side effects to Mounjaro 2.5 mg except for burping.  He is willing to restart this and stopped as a mistake from the pharmacy.  He will let me know in 1 month when he is ready to increase the  dose.  Improving with lifestyle changes. Mounjaro 2.5 mg weekly  Orders: -     POCT glycosylated hemoglobin (Hb A1C) -     Microalbumin / creatinine urine ratio  Acute bronchitis, unspecified organism Assessment & Plan: Acute, past timeframe for testing for COVID given no change in therapy. Patient with significant wheezing.  He states ever since COVID when he gets 6 sick he has wheezing.  He had similar illness in the fall that resolved. Given going on greater than 7 to 10 days will cover for bacterial superinfection with azithromycin . Will treat with prednisone  taper.  Can use albuterol  as needed.  Return and ER precautions provided.  He will go to the emergency room if he has severe shortness of breath.   Morbid obesity (HCC) Assessment & Plan: With comorbidities.  Recommend restarting GLP-1 medication.  Follow-up in 6 months at physical.  He will call with update in 1 month.   Other orders -     Tirzepatide; Inject 2.5 mg into the skin once a week. Plan increase to 5 mg weekly if tolerating after 2-4 weeks  Dispense: 2 mL; Refill: 3 -     predniSONE ; 3 tabs by mouth daily x 3 days, then 2 tabs by mouth daily x 2 days then 1 tab by mouth daily x 2 days  Dispense: 15 tablet; Refill: 0 -     Azithromycin ; 2 tab po x 1 day then 1 tab po daily  Dispense: 6 tablet; Refill: 0 -     Albuterol  Sulfate HFA; Inhale 2 puffs into the lungs every 6 (six) hours as needed for wheezing or shortness of breath.  Dispense: 8 g; Refill: 2    No follow-ups on file.   Herby Lolling, MD

## 2023-12-01 ENCOUNTER — Ambulatory Visit: Attending: Internal Medicine | Admitting: Internal Medicine

## 2023-12-01 ENCOUNTER — Encounter: Payer: Self-pay | Admitting: Internal Medicine

## 2023-12-01 VITALS — BP 120/70 | HR 60 | Ht 69.0 in | Wt 339.2 lb

## 2023-12-01 DIAGNOSIS — E785 Hyperlipidemia, unspecified: Secondary | ICD-10-CM | POA: Diagnosis not present

## 2023-12-01 DIAGNOSIS — I10 Essential (primary) hypertension: Secondary | ICD-10-CM

## 2023-12-01 DIAGNOSIS — I2510 Atherosclerotic heart disease of native coronary artery without angina pectoris: Secondary | ICD-10-CM | POA: Diagnosis not present

## 2023-12-01 DIAGNOSIS — I5032 Chronic diastolic (congestive) heart failure: Secondary | ICD-10-CM

## 2023-12-01 MED ORDER — ASPIRIN 81 MG PO TBEC: 81.0000 mg | DELAYED_RELEASE_TABLET | Freq: Every day | ORAL | 3 refills | Status: AC

## 2023-12-01 NOTE — Progress Notes (Unsigned)
 Cardiology Office Note:  .   Date:  12/02/2023  ID:  Todd Owens, DOB 1966/06/24, MRN [id number] PCP: Judithann Novas, MD  [facility] Providers Cardiologist:  Sammy Crisp, MD     History of Present Illness: Todd Owens   Todd Owens is a 58 y.o. male with history of moderate multivessel coronary artery disease (not hemodynamically significant by RFR in 2022), chronic HFpEF, syncope, hypertension, hyperlipidemia, obstructive sleep apnea, and morbid obesity, who presents for follow-up of CAD and HFpEF.  I last saw him in 01/2022, at which time he was feeling fairly well but reported a little more exertional dyspnea when outside in the heat and humidity than in years past.  He denied chest pain.  We did not make any medication changes or pursue additional testing.  Today, Todd Owens reports that he is feeling fairly well though he is still recovering from a workplace accident in 03/2022 that resulted in severe injuries to his left side.  He ultimately required left shoulder surgery and remains quite restricted in his ability to lift things.  He remains on [employer] and is doubtful that he will be able to return to his prior job.  He continues to have electrical sensations throughout his left arm and cannot lift more than 25 pounds.  From a heart standpoint, Todd Owens is doing okay.  He denies chest pain but states that his breathing "sucks."  He was recently given a course of antibiotics and corticosteroids as well as an inhaler for respiratory infection.  His cough and shortness of breath are improving but not back to baseline.  He denies palpitations and lightheadedness.  He has minimal leg edema from time to time.  He is trying to walk a bit.  He stopped taking aspirin  around the time of his left sided injury at work and has not resumed this as he was bruising quite a bit before hand.  ROS: See HPI  Studies Reviewed: Todd Owens   EKG Interpretation Date/Time:  Wednesday Dec 01 2023 11:06:21  EDT Ventricular Rate:  60 PR Interval:  148 QRS Duration:  102 QT Interval:  406 QTC Calculation: 406 R Axis:   -5  Text Interpretation: Normal sinus rhythm Normal ECG When compared with ECG of 19-Apr-2023 20:35, Criteria for Anterior infarct are no longer Present Confirmed by Todd Owens, Todd Owens [phone]) on 12/02/2023 10:56:51 AM    Cath/PCI: LHC (8/5/2.22): LMCA normal.  60% proximal and 30% mid LAD stenoses (RFR 0.91), 50% ostial ramus intermedius lesion (RFR 1.0), normal LCx, and dominant RCA with minimal luminal irregularities.  LVEF 55-65% with mildly to moderately elevated filling pressure (LVEDP 20-25 mmHg).      Non-Invasive Evaluation(s): Coronary CTA (01/09/2021): Poor quality study due to attenuation (obesity).  CAC 1155 (99th percentile).  Moderate to severe multivessel CAD of up to 50-69%.  CT FFR 0.72 in distal LAD and 0.77 in distal LCx. TTE (09/20/2019): Technically difficult study with LVEF of 55-60%.  Normal RV size and function.  Normal PA pressure.  No significant valvular abnormality. Exercise tolerance test (08/10/16): Fair exercise capacity (6 minutes, 48 seconds) achieving 155 bpm (91% MPHR). Hypertensive response to exercise. No significant ST-T segment or T-wave changes. Low risk study. Transthoracic echocardiogram (07/25/16): Normal LV size with moderate LVH. LVEF 60-65%. Normal diastolic function. Mildly dilated aortic root. Mild left atrial enlargement. Normal RV size and function. No significant valvular abnormalities. Pharmacologic myocardial perfusion stress test (04/28/11): Normal study without ischemia or scar. LVEF 54%.  Risk  Assessment/Calculations:             Physical Exam:   VS:  BP 120/70 (BP Location: Left Arm, Patient Position: Sitting, Cuff Size: Large)   Pulse 60   Ht 5\' 9"  (1.753 m)   Wt (!) 339 lb 4 oz (153.9 kg)   SpO2 93%   BMI 50.10 kg/m    Wt Readings from Last 3 Encounters:  12/01/23 (!) 339 lb 4 oz (153.9 kg)  11/23/23 (!) 341 lb 6 oz (154.8  kg)  08/12/23 (!) 349 lb 4 oz (158.4 kg)    General:  NAD. Neck: No JVD or HJR. Lungs: Clear to auscultation bilaterally without wheezes or crackles. Heart: Regular rate and rhythm without murmurs, rubs, or gallops. Abdomen: Soft, nontender, nondistended. Extremities: No lower extremity edema.  ASSESSMENT AND PLAN: .    Nonobstructive coronary artery disease: Todd Owens is not had any angina.  I have asked him to resume aspirin  81 mg daily in the setting of his moderate CAD.  Continue rosuvastatin  40 mg daily to prevent progression of disease.  Chronic HFpEF: Todd Owens notes occasional lower extremity edema and chronic shortness of breath that recently worsened with respiratory infection.  Dyspnea is returning to baseline having completed course of corticosteroids and antibiotics.  He still has bronchodilator to use as needed as well.  Will plan to continue current regimen of losartan  100 mg daily and furosemide  40 mg daily.  Hypertension: Blood pressure well-controlled.  Continue current dose of losartan .  Hyperlipidemia: Continue rosuvastatin  40 mg daily; LDL well-controlled at 55 on last check in 06/2023.  Morbid obesity: BMI noted to be 50.  I encouraged Todd Owens to increase his activity as tolerated.  Continue tirzepatide to assist with weight loss per Dr. Cherlyn Cornet.    Dispo: Return to clinic in 6 months.  Signed, Sammy Crisp, MD

## 2023-12-01 NOTE — Patient Instructions (Signed)
 Medication Instructions:  Your physician recommends the following medication changes.  RESTART: Aspirin  81 mg by mouth daily  *If you need a refill on your cardiac medications before your next appointment, please call your pharmacy*  Lab Work: No labs ordered today   Testing/Procedures: No test ordered today   Follow-Up: At [facility], you and your health needs are our priority.  As part of our continuing mission to provide you with exceptional heart care, our providers are all part of one team.  This team includes your primary Cardiologist (physician) and Advanced Practice Providers or APPs (Physician Assistants and Nurse Practitioners) who all work together to provide you with the care you need, when you need it.  Your next appointment:   6 month(s)  Provider:   You may see Sammy Crisp, MD or one of the following Advanced Practice Providers on your designated Care Team:   Laneta Pintos, NP Gildardo Labrador, PA-C Varney Gentleman, PA-C Cadence Navasota, PA-C Ronald Cockayne, NP Morey Ar, NP    We recommend signing up for the patient portal called "MyChart".  Sign up information is provided on this After Visit Summary.  MyChart is used to connect with patients for Virtual Visits (Telemedicine).  Patients are able to view lab/test results, encounter notes, upcoming appointments, etc.  Non-urgent messages can be sent to your provider as well.   To learn more about what you can do with MyChart, go to [email or web address].

## 2023-12-02 ENCOUNTER — Encounter: Payer: Self-pay | Admitting: Internal Medicine

## 2023-12-17 ENCOUNTER — Telehealth: Payer: Self-pay | Admitting: Family Medicine

## 2023-12-17 MED ORDER — TIRZEPATIDE 5 MG/0.5ML ~~LOC~~ SOAJ: 5.0000 mg | SUBCUTANEOUS | 3 refills | Status: AC

## 2023-12-17 NOTE — Telephone Encounter (Signed)
 Mr. Koenen was in office today and wanted to let Dr. Cherlyn Cornet know he is tolerated the Mounjaro injections.  His weight today was 340 lbs.  He would be agreeable to titrate up to next dose.  Please advise.

## 2023-12-17 NOTE — Telephone Encounter (Signed)
 Sent in prescription for increased dose of Mounjaro.

## 2023-12-23 ENCOUNTER — Other Ambulatory Visit: Payer: Self-pay | Admitting: Family Medicine

## 2024-02-16 ENCOUNTER — Other Ambulatory Visit: Payer: Self-pay | Admitting: Family Medicine

## 2024-02-16 ENCOUNTER — Telehealth: Payer: Self-pay | Admitting: *Deleted

## 2024-02-16 NOTE — Telephone Encounter (Signed)
 Copied from CRM [id number]. Topic: Clinical - Medication Question >> Feb 16, 2024 10:00 AM Jayma L wrote: Reason for CRM: tirzepatide  (MOUNJARO ) 5 MG/0.5ML Pen Patient asking if it needs upped, said he's handling it ok and no issues, wondering if it needs to be higher, he's losing a little weight on it. Said he's 320 right now

## 2024-02-16 NOTE — Telephone Encounter (Signed)
 Spoke with Todd Owens.  He states he is still doing the Mounjaro  2.5 mg and is asking if his dose was suppose to be increased. I advised that yes he is suppose to be taking Mounjaro  5 mg and Dr. Avelina had sent in new Rx on 12/17/23.  He states that [facility] keeps filling the 2.5 mg.  I advised him to reach out to [facility] and ask them to cancel remaining refills on the 2.5 mg and fill the 5 mg which should be on file.  Patient states understanding.

## 2024-04-10 ENCOUNTER — Ambulatory Visit: Admitting: Pulmonary Disease

## 2024-08-18 ENCOUNTER — Other Ambulatory Visit: Payer: Self-pay | Admitting: Family Medicine
